# Patient Record
Sex: Male | Born: 1935 | Race: Asian | Hispanic: No | Marital: Married | Smoking: Never smoker
Health system: Southern US, Community
[De-identification: ages and names within clinical notes are randomized; demographics above are authoritative.]

## PROBLEM LIST (undated history)

## (undated) DIAGNOSIS — N400 Enlarged prostate without lower urinary tract symptoms: Secondary | ICD-10-CM

## (undated) DIAGNOSIS — A159 Respiratory tuberculosis unspecified: Secondary | ICD-10-CM

## (undated) DIAGNOSIS — I10 Essential (primary) hypertension: Secondary | ICD-10-CM

## (undated) DIAGNOSIS — I639 Cerebral infarction, unspecified: Secondary | ICD-10-CM

## (undated) DIAGNOSIS — K4090 Unilateral inguinal hernia, without obstruction or gangrene, not specified as recurrent: Secondary | ICD-10-CM

## (undated) HISTORY — PX: FRACTURE SURGERY: SHX138

---

## 1998-11-10 HISTORY — PX: BRAIN SURGERY: SHX531

## 1999-06-04 ENCOUNTER — Encounter: Payer: Self-pay | Admitting: Internal Medicine

## 1999-06-04 ENCOUNTER — Inpatient Hospital Stay (HOSPITAL_COMMUNITY): Admission: EM | Admit: 1999-06-04 | Discharge: 1999-06-11 | Payer: Self-pay | Admitting: Emergency Medicine

## 1999-07-10 ENCOUNTER — Ambulatory Visit (HOSPITAL_COMMUNITY): Admission: RE | Admit: 1999-07-10 | Discharge: 1999-07-10 | Payer: Self-pay | Admitting: Gastroenterology

## 2002-11-10 DIAGNOSIS — I639 Cerebral infarction, unspecified: Secondary | ICD-10-CM

## 2002-11-10 HISTORY — DX: Cerebral infarction, unspecified: I63.9

## 2003-05-23 ENCOUNTER — Encounter: Payer: Self-pay | Admitting: Emergency Medicine

## 2003-05-23 ENCOUNTER — Inpatient Hospital Stay (HOSPITAL_COMMUNITY): Admission: EM | Admit: 2003-05-23 | Discharge: 2003-05-30 | Payer: Self-pay | Admitting: Emergency Medicine

## 2003-05-23 ENCOUNTER — Encounter: Payer: Self-pay | Admitting: Neurology

## 2003-05-24 ENCOUNTER — Encounter: Payer: Self-pay | Admitting: Neurology

## 2003-05-26 ENCOUNTER — Encounter: Payer: Self-pay | Admitting: Neurology

## 2003-05-30 ENCOUNTER — Inpatient Hospital Stay (HOSPITAL_COMMUNITY)
Admission: RE | Admit: 2003-05-30 | Discharge: 2003-06-23 | Payer: Self-pay | Admitting: Physical Medicine & Rehabilitation

## 2003-05-31 ENCOUNTER — Encounter: Payer: Self-pay | Admitting: Neurology

## 2003-08-01 ENCOUNTER — Encounter
Admission: RE | Admit: 2003-08-01 | Discharge: 2003-10-30 | Payer: Self-pay | Admitting: Physical Medicine & Rehabilitation

## 2003-08-17 ENCOUNTER — Encounter
Admission: RE | Admit: 2003-08-17 | Discharge: 2003-11-15 | Payer: Self-pay | Admitting: Physical Medicine & Rehabilitation

## 2004-09-24 ENCOUNTER — Emergency Department (HOSPITAL_COMMUNITY): Admission: EM | Admit: 2004-09-24 | Discharge: 2004-09-24 | Payer: Self-pay | Admitting: Family Medicine

## 2008-12-30 ENCOUNTER — Observation Stay (HOSPITAL_COMMUNITY): Admission: EM | Admit: 2008-12-30 | Discharge: 2008-12-31 | Payer: Self-pay | Admitting: Emergency Medicine

## 2008-12-30 ENCOUNTER — Ambulatory Visit: Payer: Self-pay | Admitting: Internal Medicine

## 2009-03-22 ENCOUNTER — Inpatient Hospital Stay (HOSPITAL_COMMUNITY): Admission: EM | Admit: 2009-03-22 | Discharge: 2009-03-30 | Payer: Self-pay | Admitting: Emergency Medicine

## 2009-03-22 ENCOUNTER — Ambulatory Visit: Payer: Self-pay | Admitting: Internal Medicine

## 2009-03-23 ENCOUNTER — Encounter (INDEPENDENT_AMBULATORY_CARE_PROVIDER_SITE_OTHER): Payer: Self-pay | Admitting: Internal Medicine

## 2009-03-24 ENCOUNTER — Ambulatory Visit: Payer: Self-pay | Admitting: Infectious Diseases

## 2009-03-24 ENCOUNTER — Ambulatory Visit: Payer: Self-pay | Admitting: Vascular Surgery

## 2009-03-24 ENCOUNTER — Encounter (INDEPENDENT_AMBULATORY_CARE_PROVIDER_SITE_OTHER): Payer: Self-pay | Admitting: Internal Medicine

## 2009-03-27 ENCOUNTER — Encounter: Payer: Self-pay | Admitting: Internal Medicine

## 2009-03-28 ENCOUNTER — Ambulatory Visit: Payer: Self-pay | Admitting: Oncology

## 2009-03-28 ENCOUNTER — Encounter: Payer: Self-pay | Admitting: Internal Medicine

## 2009-03-30 ENCOUNTER — Ambulatory Visit: Payer: Self-pay | Admitting: Physical Medicine & Rehabilitation

## 2009-03-30 ENCOUNTER — Inpatient Hospital Stay (HOSPITAL_COMMUNITY)
Admission: RE | Admit: 2009-03-30 | Discharge: 2009-04-06 | Payer: Self-pay | Admitting: Physical Medicine & Rehabilitation

## 2009-04-17 ENCOUNTER — Encounter: Payer: Self-pay | Admitting: Infectious Diseases

## 2009-04-26 ENCOUNTER — Ambulatory Visit: Payer: Self-pay | Admitting: Infectious Diseases

## 2009-04-26 DIAGNOSIS — G8111 Spastic hemiplegia affecting right dominant side: Secondary | ICD-10-CM

## 2009-04-26 DIAGNOSIS — R630 Anorexia: Secondary | ICD-10-CM | POA: Insufficient documentation

## 2009-04-26 DIAGNOSIS — I10 Essential (primary) hypertension: Secondary | ICD-10-CM

## 2009-04-26 DIAGNOSIS — N318 Other neuromuscular dysfunction of bladder: Secondary | ICD-10-CM

## 2009-04-26 DIAGNOSIS — A150 Tuberculosis of lung: Secondary | ICD-10-CM

## 2009-04-26 LAB — CONVERTED CEMR LAB
ALT: 14 units/L (ref 0–53)
AST: 20 units/L (ref 0–37)
Albumin: 3.2 g/dL — ABNORMAL LOW (ref 3.5–5.2)
Alkaline Phosphatase: 49 units/L (ref 39–117)
Basophils Absolute: 0 10*3/uL (ref 0.0–0.1)
Eosinophils Absolute: 0.2 10*3/uL (ref 0.0–0.7)
Eosinophils Relative: 2 % (ref 0–5)
HCT: 30.8 % — ABNORMAL LOW (ref 39.0–52.0)
Hemoglobin: 9.8 g/dL — ABNORMAL LOW (ref 13.0–17.0)
MCV: 73.6 fL — ABNORMAL LOW (ref 78.0–100.0)
Monocytes Absolute: 1.2 10*3/uL — ABNORMAL HIGH (ref 0.1–1.0)
Platelets: 191 10*3/uL (ref 150–400)
Potassium: 4.4 meq/L (ref 3.5–5.3)
RDW: 16.9 % — ABNORMAL HIGH (ref 11.5–15.5)
Sodium: 131 meq/L — ABNORMAL LOW (ref 135–145)
Total Bilirubin: 0.6 mg/dL (ref 0.3–1.2)
Total Protein: 7.6 g/dL (ref 6.0–8.3)

## 2009-04-27 ENCOUNTER — Encounter: Payer: Self-pay | Admitting: Infectious Diseases

## 2009-05-29 ENCOUNTER — Encounter: Admission: RE | Admit: 2009-05-29 | Discharge: 2009-05-29 | Payer: Self-pay | Admitting: Pulmonary Disease

## 2009-12-12 ENCOUNTER — Encounter: Admission: RE | Admit: 2009-12-12 | Discharge: 2009-12-12 | Payer: Self-pay | Admitting: Infectious Diseases

## 2010-12-19 ENCOUNTER — Encounter: Admission: RE | Admit: 2010-12-19 | Payer: Self-pay | Source: Home / Self Care | Admitting: Family Medicine

## 2010-12-19 ENCOUNTER — Ambulatory Visit: Payer: Medicare Other | Attending: Family Medicine | Admitting: Physical Therapy

## 2010-12-19 DIAGNOSIS — IMO0001 Reserved for inherently not codable concepts without codable children: Secondary | ICD-10-CM | POA: Insufficient documentation

## 2010-12-19 DIAGNOSIS — R269 Unspecified abnormalities of gait and mobility: Secondary | ICD-10-CM | POA: Insufficient documentation

## 2010-12-19 DIAGNOSIS — I69998 Other sequelae following unspecified cerebrovascular disease: Secondary | ICD-10-CM | POA: Insufficient documentation

## 2010-12-19 DIAGNOSIS — R262 Difficulty in walking, not elsewhere classified: Secondary | ICD-10-CM | POA: Insufficient documentation

## 2011-02-18 LAB — IRON AND TIBC
Iron: 15 ug/dL — ABNORMAL LOW (ref 42–135)
Iron: 26 ug/dL — ABNORMAL LOW (ref 42–135)
Saturation Ratios: 11 % — ABNORMAL LOW (ref 20–55)
TIBC: 141 ug/dL — ABNORMAL LOW (ref 215–435)
TIBC: 155 ug/dL — ABNORMAL LOW (ref 215–435)
UIBC: 129 ug/dL

## 2011-02-18 LAB — DIFFERENTIAL
Band Neutrophils: 0 % (ref 0–10)
Band Neutrophils: 0 % (ref 0–10)
Band Neutrophils: 0 % (ref 0–10)
Basophils Absolute: 0 10*3/uL (ref 0.0–0.1)
Basophils Absolute: 0 10*3/uL (ref 0.0–0.1)
Basophils Absolute: 0.1 10*3/uL (ref 0.0–0.1)
Basophils Absolute: 0.1 10*3/uL (ref 0.0–0.1)
Basophils Absolute: 0 10*3/uL (ref 0.0–0.1)
Basophils Absolute: 0 10*3/uL (ref 0.0–0.1)
Basophils Absolute: 0 10*3/uL (ref 0.0–0.1)
Basophils Relative: 0 % (ref 0–1)
Basophils Relative: 0 % (ref 0–1)
Basophils Relative: 1 % (ref 0–1)
Basophils Relative: 1 % (ref 0–1)
Basophils Relative: 2 % — ABNORMAL HIGH (ref 0–1)
Basophils Relative: 2 % — ABNORMAL HIGH (ref 0–1)
Basophils Relative: 2 % — ABNORMAL HIGH (ref 0–1)
Basophils Relative: 3 % — ABNORMAL HIGH (ref 0–1)
Basophils Relative: 0 % (ref 0–1)
Basophils Relative: 0 % (ref 0–1)
Blasts: 0 %
Blasts: 0 %
Blasts: 0 %
Eosinophils Absolute: 0.3 10*3/uL (ref 0.0–0.7)
Eosinophils Absolute: 0.4 10*3/uL (ref 0.0–0.7)
Eosinophils Absolute: 0.5 10*3/uL (ref 0.0–0.7)
Eosinophils Absolute: 0.1 10*3/uL (ref 0.0–0.7)
Eosinophils Relative: 11 % — ABNORMAL HIGH (ref 0–5)
Eosinophils Relative: 11 % — ABNORMAL HIGH (ref 0–5)
Eosinophils Relative: 14 % — ABNORMAL HIGH (ref 0–5)
Eosinophils Relative: 4 % (ref 0–5)
Lymphocytes Relative: 31 % (ref 12–46)
Lymphocytes Relative: 34 % (ref 12–46)
Lymphocytes Relative: 5 % — ABNORMAL LOW (ref 12–46)
Lymphocytes Relative: 6 % — ABNORMAL LOW (ref 12–46)
Lymphocytes Relative: 8 % — ABNORMAL LOW (ref 12–46)
Lymphs Abs: 0.5 10*3/uL — ABNORMAL LOW (ref 0.7–4.0)
Lymphs Abs: 0.6 10*3/uL — ABNORMAL LOW (ref 0.7–4.0)
Lymphs Abs: 1.3 10*3/uL (ref 0.7–4.0)
Lymphs Abs: 0.7 10*3/uL (ref 0.7–4.0)
Lymphs Abs: 1 10*3/uL (ref 0.7–4.0)
Lymphs Abs: 1.2 10*3/uL (ref 0.7–4.0)
Metamyelocytes Relative: 0 %
Metamyelocytes Relative: 0 %
Metamyelocytes Relative: 0 %
Metamyelocytes Relative: 0 %
Monocytes Absolute: 0.5 10*3/uL (ref 0.1–1.0)
Monocytes Absolute: 0.9 10*3/uL (ref 0.1–1.0)
Monocytes Absolute: 0.9 10*3/uL (ref 0.1–1.0)
Monocytes Absolute: 1 10*3/uL (ref 0.1–1.0)
Monocytes Absolute: 1 10*3/uL (ref 0.1–1.0)
Monocytes Absolute: 1.2 10*3/uL — ABNORMAL HIGH (ref 0.1–1.0)
Monocytes Absolute: 1.6 10*3/uL — ABNORMAL HIGH (ref 0.1–1.0)
Monocytes Relative: 25 % — ABNORMAL HIGH (ref 3–12)
Monocytes Relative: 31 % — ABNORMAL HIGH (ref 3–12)
Monocytes Relative: 33 % — ABNORMAL HIGH (ref 3–12)
Monocytes Relative: 36 % — ABNORMAL HIGH (ref 3–12)
Monocytes Relative: 37 % — ABNORMAL HIGH (ref 3–12)
Myelocytes: 0 %
Myelocytes: 0 %
Neutro Abs: 0.4 10*3/uL — ABNORMAL LOW (ref 1.7–7.7)
Neutro Abs: 0.6 10*3/uL — ABNORMAL LOW (ref 1.7–7.7)
Neutro Abs: 1 10*3/uL — ABNORMAL LOW (ref 1.7–7.7)
Neutro Abs: 11.4 10*3/uL — ABNORMAL HIGH (ref 1.7–7.7)
Neutro Abs: 11.6 10*3/uL — ABNORMAL HIGH (ref 1.7–7.7)
Neutro Abs: 12.3 10*3/uL — ABNORMAL HIGH (ref 1.7–7.7)
Neutrophils Relative %: 16 % — ABNORMAL LOW (ref 43–77)
Neutrophils Relative %: 23 % — ABNORMAL LOW (ref 43–77)
Neutrophils Relative %: 26 % — ABNORMAL LOW (ref 43–77)
Neutrophils Relative %: 29 % — ABNORMAL LOW (ref 43–77)
Neutrophils Relative %: 79 % — ABNORMAL HIGH (ref 43–77)
Neutrophils Relative %: 81 % — ABNORMAL HIGH (ref 43–77)
Neutrophils Relative %: 82 % — ABNORMAL HIGH (ref 43–77)
Promyelocytes Absolute: 0 %
Smear Review: ADEQUATE
Smear Review: ADEQUATE
Smear Review: ADEQUATE
nRBC: 0 /100 WBC

## 2011-02-18 LAB — CBC
HCT: 25.8 % — ABNORMAL LOW (ref 39.0–52.0)
HCT: 26.3 % — ABNORMAL LOW (ref 39.0–52.0)
HCT: 27.8 % — ABNORMAL LOW (ref 39.0–52.0)
HCT: 28.9 % — ABNORMAL LOW (ref 39.0–52.0)
HCT: 29.1 % — ABNORMAL LOW (ref 39.0–52.0)
HCT: 30.1 % — ABNORMAL LOW (ref 39.0–52.0)
HCT: 30.2 % — ABNORMAL LOW (ref 39.0–52.0)
HCT: 28.8 % — ABNORMAL LOW (ref 39.0–52.0)
HCT: 31.3 % — ABNORMAL LOW (ref 39.0–52.0)
Hemoglobin: 10.4 g/dL — ABNORMAL LOW (ref 13.0–17.0)
Hemoglobin: 8.5 g/dL — ABNORMAL LOW (ref 13.0–17.0)
Hemoglobin: 8.8 g/dL — ABNORMAL LOW (ref 13.0–17.0)
Hemoglobin: 9.3 g/dL — ABNORMAL LOW (ref 13.0–17.0)
Hemoglobin: 9.5 g/dL — ABNORMAL LOW (ref 13.0–17.0)
Hemoglobin: 9.5 g/dL — ABNORMAL LOW (ref 13.0–17.0)
Hemoglobin: 10 g/dL — ABNORMAL LOW (ref 13.0–17.0)
Hemoglobin: 9.5 g/dL — ABNORMAL LOW (ref 13.0–17.0)
MCHC: 32.4 g/dL (ref 30.0–36.0)
MCHC: 32.4 g/dL (ref 30.0–36.0)
MCHC: 32.6 g/dL (ref 30.0–36.0)
MCHC: 33.4 g/dL (ref 30.0–36.0)
MCHC: 32.9 g/dL (ref 30.0–36.0)
MCV: 71.1 fL — ABNORMAL LOW (ref 78.0–100.0)
MCV: 71.4 fL — ABNORMAL LOW (ref 78.0–100.0)
MCV: 71.5 fL — ABNORMAL LOW (ref 78.0–100.0)
MCV: 71.7 fL — ABNORMAL LOW (ref 78.0–100.0)
MCV: 71.9 fL — ABNORMAL LOW (ref 78.0–100.0)
MCV: 72.5 fL — ABNORMAL LOW (ref 78.0–100.0)
MCV: 72.6 fL — ABNORMAL LOW (ref 78.0–100.0)
Platelets: 267 10*3/uL (ref 150–400)
Platelets: 296 10*3/uL (ref 150–400)
Platelets: ADEQUATE 10*3/uL (ref 150–400)
Platelets: ADEQUATE 10*3/uL (ref 150–400)
Platelets: UNDETERMINED 10*3/uL (ref 150–400)
Platelets: 160 10*3/uL (ref 150–400)
Platelets: 237 10*3/uL (ref 150–400)
RBC: 3.63 MIL/uL — ABNORMAL LOW (ref 4.22–5.81)
RBC: 3.69 MIL/uL — ABNORMAL LOW (ref 4.22–5.81)
RBC: 3.86 MIL/uL — ABNORMAL LOW (ref 4.22–5.81)
RBC: 3.97 MIL/uL — ABNORMAL LOW (ref 4.22–5.81)
RBC: 3.98 MIL/uL — ABNORMAL LOW (ref 4.22–5.81)
RBC: 4.21 MIL/uL — ABNORMAL LOW (ref 4.22–5.81)
RBC: 4.43 MIL/uL (ref 4.22–5.81)
RBC: 3.98 MIL/uL — ABNORMAL LOW (ref 4.22–5.81)
RBC: 4.32 MIL/uL (ref 4.22–5.81)
RDW: 13.7 % (ref 11.5–15.5)
RDW: 13.8 % (ref 11.5–15.5)
RDW: 13.9 % (ref 11.5–15.5)
RDW: 14.6 % (ref 11.5–15.5)
RDW: 13.4 % (ref 11.5–15.5)
RDW: 13.6 % (ref 11.5–15.5)
RDW: 13.7 % (ref 11.5–15.5)
WBC: 12 10*3/uL — ABNORMAL HIGH (ref 4.0–10.5)
WBC: 2.6 10*3/uL — ABNORMAL LOW (ref 4.0–10.5)
WBC: 2.9 10*3/uL — ABNORMAL LOW (ref 4.0–10.5)
WBC: 3.1 10*3/uL — ABNORMAL LOW (ref 4.0–10.5)
WBC: 3.2 10*3/uL — ABNORMAL LOW (ref 4.0–10.5)
WBC: 3.2 10*3/uL — ABNORMAL LOW (ref 4.0–10.5)
WBC: 3.3 10*3/uL — ABNORMAL LOW (ref 4.0–10.5)
WBC: 14.3 10*3/uL — ABNORMAL HIGH (ref 4.0–10.5)
WBC: 14.5 10*3/uL — ABNORMAL HIGH (ref 4.0–10.5)
WBC: 15 10*3/uL — ABNORMAL HIGH (ref 4.0–10.5)

## 2011-02-18 LAB — BODY FLUID CELL COUNT WITH DIFFERENTIAL
Eos, Fluid: 2 %
Monocyte-Macrophage-Serous Fluid: 15 % — ABNORMAL LOW (ref 50–90)
Neutrophil Count, Fluid: 19 % (ref 0–25)
Total Nucleated Cell Count, Fluid: 505 cu mm (ref 0–1000)

## 2011-02-18 LAB — APTT
aPTT: 38 seconds — ABNORMAL HIGH (ref 24–37)
aPTT: 43 seconds — ABNORMAL HIGH (ref 24–37)

## 2011-02-18 LAB — BASIC METABOLIC PANEL
BUN: 11 mg/dL (ref 6–23)
BUN: 12 mg/dL (ref 6–23)
BUN: 18 mg/dL (ref 6–23)
BUN: 9 mg/dL (ref 6–23)
Calcium: 8.9 mg/dL (ref 8.4–10.5)
Chloride: 100 mEq/L (ref 96–112)
Chloride: 105 mEq/L (ref 96–112)
Creatinine, Ser: 0.81 mg/dL (ref 0.4–1.5)
Creatinine, Ser: 0.93 mg/dL (ref 0.4–1.5)
GFR calc Af Amer: >60 mL/min (ref >60)
GFR calc non Af Amer: >60 mL/min (ref >60)
GFR calc non Af Amer: >60 mL/min (ref >60)
GFR calc non Af Amer: >60 mL/min (ref >60)
Glucose, Bld: 92 mg/dL (ref 70–99)
Glucose, Bld: 100 mg/dL — ABNORMAL HIGH (ref 70–99)
Potassium: 3.6 mEq/L (ref 3.5–5.1)
Potassium: 3.6 mEq/L (ref 3.5–5.1)
Potassium: 3.8 mEq/L (ref 3.5–5.1)
Sodium: 134 mEq/L — ABNORMAL LOW (ref 135–145)

## 2011-02-18 LAB — RETICULOCYTES
RBC.: 3.8 MIL/uL — ABNORMAL LOW (ref 4.22–5.81)
RBC.: 4.29 MIL/uL (ref 4.22–5.81)
RBC.: 4.26 MIL/uL (ref 4.22–5.81)
Retic Count, Absolute: 42.9 10*3/uL (ref 19.0–186.0)
Retic Ct Pct: 1.3 % (ref 0.4–3.1)
Retic Ct Pct: 0.7 % (ref 0.4–3.1)

## 2011-02-18 LAB — COMPREHENSIVE METABOLIC PANEL
ALT: 30 U/L (ref 0–53)
ALT: 60 U/L — ABNORMAL HIGH (ref 0–53)
ALT: 33 U/L (ref 0–53)
Albumin: 2.2 g/dL — ABNORMAL LOW (ref 3.5–5.2)
Albumin: 2.5 g/dL — ABNORMAL LOW (ref 3.5–5.2)
Alkaline Phosphatase: 53 U/L (ref 39–117)
Alkaline Phosphatase: 65 U/L (ref 39–117)
BUN: 10 mg/dL (ref 6–23)
BUN: 9 mg/dL (ref 6–23)
Calcium: 8.3 mg/dL — ABNORMAL LOW (ref 8.4–10.5)
Chloride: 103 mEq/L (ref 96–112)
Chloride: 94 mEq/L — ABNORMAL LOW (ref 96–112)
Glucose, Bld: 101 mg/dL — ABNORMAL HIGH (ref 70–99)
Glucose, Bld: 95 mg/dL (ref 70–99)
Glucose, Bld: 117 mg/dL — ABNORMAL HIGH (ref 70–99)
Potassium: 4 mEq/L (ref 3.5–5.1)
Potassium: 4.1 mEq/L (ref 3.5–5.1)
Sodium: 133 mEq/L — ABNORMAL LOW (ref 135–145)
Sodium: 129 mEq/L — ABNORMAL LOW (ref 135–145)
Total Bilirubin: 1.2 mg/dL (ref 0.3–1.2)
Total Bilirubin: 0.6 mg/dL (ref 0.3–1.2)
Total Protein: 5.9 g/dL — ABNORMAL LOW (ref 6.0–8.3)
Total Protein: 7.4 g/dL (ref 6.0–8.3)

## 2011-02-18 LAB — GLUCOSE, CAPILLARY
Glucose-Capillary: 116 mg/dL — ABNORMAL HIGH (ref 70–99)
Glucose-Capillary: 94 mg/dL (ref 70–99)
Glucose-Capillary: 126 mg/dL — ABNORMAL HIGH (ref 70–99)

## 2011-02-18 LAB — LEGIONELLA PROFILE(CULTURE+DFA/SMEAR)

## 2011-02-18 LAB — CULTURE, RESPIRATORY W GRAM STAIN

## 2011-02-18 LAB — M. TUBERCULOSIS COMPLEX BY PCR: M. tuberculosis, Direct: NOT DETECTED

## 2011-02-18 LAB — URINALYSIS, ROUTINE W REFLEX MICROSCOPIC
Glucose, UA: NEGATIVE mg/dL
Nitrite: NEGATIVE
pH: 7.5 (ref 5.0–8.0)

## 2011-02-18 LAB — AFB CULTURE WITH SMEAR (NOT AT ARMC)
Acid Fast Smear: NONE SEEN
Acid Fast Smear: NONE SEEN

## 2011-02-18 LAB — FERRITIN: Ferritin: 1296 ng/mL — ABNORMAL HIGH (ref 22–322)

## 2011-02-18 LAB — URINE CULTURE
Colony Count: NO GROWTH
Colony Count: NO GROWTH
Culture: NO GROWTH
Culture: NO GROWTH
Special Requests: NEGATIVE

## 2011-02-18 LAB — PROTIME-INR
INR: 1.2 (ref 0.00–1.49)
INR: 1.3 (ref 0.00–1.49)
Prothrombin Time: 15.9 seconds — ABNORMAL HIGH (ref 11.6–15.2)
Prothrombin Time: 16 seconds — ABNORMAL HIGH (ref 11.6–15.2)
Prothrombin Time: 16.1 seconds — ABNORMAL HIGH (ref 11.6–15.2)
Prothrombin Time: 15.3 seconds — ABNORMAL HIGH (ref 11.6–15.2)

## 2011-02-18 LAB — CULTURE, BLOOD (ROUTINE X 2): Culture: NO GROWTH

## 2011-02-18 LAB — C-REACTIVE PROTEIN: CRP: 18.6 mg/dL — ABNORMAL HIGH (ref <0.6)

## 2011-02-18 LAB — FUNGUS CULTURE W SMEAR: Fungal Smear: NONE SEEN

## 2011-02-18 LAB — VON WILLEBRAND PANEL
Factor-VIII Activity: 95 % (ref 50–150)
Ristocetin-Cofactor: 137 % (ref 50–150)
Von Willebrand Ag: 324 % normal — ABNORMAL HIGH (ref 61–164)

## 2011-02-18 LAB — SODIUM, URINE, RANDOM: Sodium, Ur: 53 mEq/L

## 2011-02-18 LAB — TSH: TSH: 0.966 u[IU]/mL (ref 0.350–4.500)

## 2011-02-18 LAB — HEMOGLOBINOPATHY EVALUATION
Hemoglobin Other: 0 % (ref 0.0–0.0)
Hgb A: 97.1 % (ref 96.8–97.8)

## 2011-02-18 LAB — LUPUS ANTICOAGULANT PANEL
DRVVT: 48.6 secs — ABNORMAL HIGH (ref 36.1–47.0)
Lupus Anticoagulant: NOT DETECTED
PTT Lupus Anticoagulant: 53.8 secs — ABNORMAL HIGH (ref 36.3–48.8)

## 2011-02-18 LAB — PATHOLOGIST SMEAR REVIEW

## 2011-02-18 LAB — AFB CULTURE, BLOOD

## 2011-02-18 LAB — FUNGAL STAIN: Special Requests: 1

## 2011-02-18 LAB — POCT I-STAT, CHEM 8
BUN: 10 mg/dL (ref 6–23)
Calcium, Ion: 1.15 mmol/L (ref 1.12–1.32)
Creatinine, Ser: 0.9 mg/dL (ref 0.4–1.5)
TCO2: 26 mmol/L (ref 0–100)

## 2011-02-18 LAB — FREE PSA: PSA, Free Pct: 14 % — ABNORMAL LOW (ref >25)

## 2011-02-18 LAB — URINALYSIS, MICROSCOPIC ONLY
Leukocytes, UA: NEGATIVE
Nitrite: NEGATIVE
Specific Gravity, Urine: 1.013 (ref 1.005–1.030)
pH: 6 (ref 5.0–8.0)

## 2011-02-18 LAB — STREP PNEUMONIAE URINARY ANTIGEN: Strep Pneumo Urinary Antigen: NEGATIVE

## 2011-02-18 LAB — CREATININE, URINE, RANDOM: Creatinine, Urine: 31.9 mg/dL

## 2011-02-18 LAB — BETA-2-GLYCOPROTEIN I ABS, IGG/M/A: Beta-2-Glycoprotein I IgA: <4 U/mL (ref <10)

## 2011-02-18 LAB — HEPATITIS B SURFACE ANTIBODY,QUALITATIVE
Hep B S Ab: NEGATIVE
Hep B S Ab: NEGATIVE

## 2011-02-18 LAB — CARDIOLIPIN ANTIBODIES, IGG, IGM, IGA
Anticardiolipin IgG: <7 [GPL'U] — ABNORMAL LOW (ref <11)
Anticardiolipin IgM: <7 [MPL'U] — ABNORMAL LOW (ref <10)

## 2011-02-18 LAB — FACTOR 10 ASSAY: Factor X Activity: 71 % — ABNORMAL LOW (ref 72–134)

## 2011-02-18 LAB — GC/CHLAMYDIA PROBE AMP, URINE: GC Probe Amp, Urine: NEGATIVE

## 2011-02-18 LAB — PTT FACTOR INHIBITOR (MIXING STUDY): 1 Hr Incub PT 1:1NP: 40 seconds

## 2011-02-18 LAB — HISTOPLASMA ANTIGEN, URINE
Histoplasma Antigen Urine: NEGATIVE
Histoplasma Antigen, urine: <2 U/mL

## 2011-02-18 LAB — SEDIMENTATION RATE: Sed Rate: 66 mm/hr — ABNORMAL HIGH (ref 0–16)

## 2011-02-18 LAB — FOLATE: Folate: 13.5 ng/mL

## 2011-02-18 LAB — HIV 1/2 CONFIRMATION
HIV-1 antibody: UNDETERMINED
HIV-2 Ab: NEGATIVE

## 2011-02-18 LAB — HEPATITIS B SURFACE ANTIGEN: Hepatitis B Surface Ag: POSITIVE — AB

## 2011-02-18 LAB — HEPATITIS B DNA, ULTRAQUANTITATIVE, PCR: Hepatitis B DNA (Calc): 1129 copies/mL — ABNORMAL HIGH (ref <169)

## 2011-02-18 LAB — AFB STAIN: Special Requests: 1

## 2011-02-18 LAB — FACTOR 7 ASSAY: Factor VII Activity: 46 % — ABNORMAL LOW (ref 80–181)

## 2011-02-18 LAB — CRYPTOCOCCAL ANTIGEN: Crypto Ag: NEGATIVE

## 2011-02-25 LAB — CBC
HCT: 35.4 % — ABNORMAL LOW (ref 39.0–52.0)
Hemoglobin: 11.4 g/dL — ABNORMAL LOW (ref 13.0–17.0)
Hemoglobin: 11.4 g/dL — ABNORMAL LOW (ref 13.0–17.0)
MCHC: 32.2 g/dL (ref 30.0–36.0)
Platelets: UNDETERMINED 10*3/uL (ref 150–400)
Platelets: UNDETERMINED 10*3/uL (ref 150–400)
RDW: 13.3 % (ref 11.5–15.5)
RDW: 13.5 % (ref 11.5–15.5)

## 2011-02-25 LAB — DIFFERENTIAL
Basophils Relative: 1 % (ref 0–1)
Eosinophils Relative: 7 % — ABNORMAL HIGH (ref 0–5)
Lymphocytes Relative: 18 % (ref 12–46)
Monocytes Relative: 9 % (ref 3–12)
Neutrophils Relative %: 65 % (ref 43–77)

## 2011-02-25 LAB — URINALYSIS, ROUTINE W REFLEX MICROSCOPIC
Glucose, UA: NEGATIVE mg/dL
Ketones, ur: NEGATIVE mg/dL
Nitrite: NEGATIVE
Protein, ur: NEGATIVE mg/dL

## 2011-02-25 LAB — COMPREHENSIVE METABOLIC PANEL
ALT: 23 U/L (ref 0–53)
AST: 26 U/L (ref 0–37)
Albumin: 3.5 g/dL (ref 3.5–5.2)
Alkaline Phosphatase: 52 U/L (ref 39–117)
BUN: 10 mg/dL (ref 6–23)
Calcium: 8.5 mg/dL (ref 8.4–10.5)
GFR calc Af Amer: >60 mL/min (ref >60)
Glucose, Bld: 87 mg/dL (ref 70–99)
Potassium: 4.1 mEq/L (ref 3.5–5.1)
Sodium: 134 mEq/L — ABNORMAL LOW (ref 135–145)
Total Protein: 7.1 g/dL (ref 6.0–8.3)
Total Protein: 7.2 g/dL (ref 6.0–8.3)

## 2011-02-25 LAB — AMYLASE: Amylase: 154 U/L — ABNORMAL HIGH (ref 27–131)

## 2011-02-25 LAB — LIPASE, BLOOD: Lipase: 156 U/L — ABNORMAL HIGH (ref 11–59)

## 2011-02-25 LAB — POCT I-STAT, CHEM 8
BUN: 11 mg/dL (ref 6–23)
Hemoglobin: 13.6 g/dL (ref 13.0–17.0)
Sodium: 134 mEq/L — ABNORMAL LOW (ref 135–145)
TCO2: 28 mmol/L (ref 0–100)

## 2011-02-25 LAB — PROTIME-INR
INR: 1 (ref 0.00–1.49)
Prothrombin Time: 13.7 seconds (ref 11.6–15.2)

## 2011-02-25 LAB — URINE CULTURE

## 2011-02-25 LAB — GLUCOSE, CAPILLARY
Glucose-Capillary: 114 mg/dL — ABNORMAL HIGH (ref 70–99)
Glucose-Capillary: 91 mg/dL (ref 70–99)

## 2011-02-25 LAB — APTT: aPTT: 39 seconds — ABNORMAL HIGH (ref 24–37)

## 2011-03-25 NOTE — H&P (Signed)
[name], [name]              ACCOUNT NO.:  [id number]   MEDICAL RECORD NO.:  [id number]          PATIENT TYPE:  INP   LOCATION:  6712                         FACILITY:  [facility]   PHYSICIAN:  Gordy Savers, MDDATE OF BIRTH:  1934/09/16   DATE OF ADMISSION:  12/29/2008  DATE OF DISCHARGE:                              HISTORY & PHYSICAL   CHIEF COMPLAINT:  Abdominal pain.   HISTORY OF PRESENT ILLNESS:  The patient is a 77 year old [place]  male who has a history of cerebrovascular disease and chronic dense  right hemiparesis.  He normally is semi-ambulatory and able to transfer  and uses a powered wheelchair.  His wife states that 2 days ago he fell  to his right side sustaining some trauma to the right chest wall.  She  states that he slid more to the floor against a table and was not a  severe fall.  There was no apparent major trauma at that time, and the  patient complained of no pain.  Yesterday morning, the patient began  having of right-sided abdominal pain.  This has been persisted and  progressed throughout the day and the patient eventually was evaluated  in the emergency room.  There is no associated nausea or vomiting and  the patient's bowel habits have been normal.  ED evaluation included a  serum lipase that was elevated at 156 and I was asked to admit the  patient for suspected pancreatitis.  His white count was 8.4.  Transaminases were normal.  Acute abdominal series suggested a patchy  infiltrate at the left base, worrisome for early pneumonia.  An  abdominal ultrasound was obtained that revealed no gallstones or sludge.  There is no dilatation of the common bile duct.  The pancreas was not  visualized due to overlying bowel gas.  The abdominal ultrasound was  negative.  In the emergency room, the patient was treated with Dilaudid  and he now states that he is quite comfortable unless he moves.  He  states that it is unable to move his right leg well due  to pain and also  has pain with movement involving his right abdominal region.  A CT  abdominal scan has been ordered from the ED and is pending.  The patient  is now admitted for further evaluation and treatment of his abdominal  and right leg pain.   PAST MEDICAL HISTORY:  The patient has a history of cerebrovascular  disease and was admitted in July 2000 for an acute stroke syndrome.  At  that time, he underwent a left suboccipital craniectomy for evacuation  of a right cerebellar hematoma.  In July 2004, he was admitted with a  left thalamic and parietal hemorrhagic stroke.  He has hypertension and  BPH.   SOCIAL HISTORY:  He lives with his wife, nondrinker, nonsmoker.   FAMILY HISTORY:  Noncontributory.   ALLERGIES:  PENICILLIN.   PHYSICAL EXAMINATION:  VITAL SIGNS:  Blood pressure was 135/76, pulse  rate and respiratory rate normal, and O2 saturation 96-100%.  GENERAL:  A well-developed Asian male in no acute distress at  rest.  SKIN:  Unremarkable.  HEENT:  No signs of trauma.  Conjunctiva clear.  Mild arcus senilis  noted.  No scleral icterus appreciated.  ENT normal.  NECK:  Prominent jugular venous pressure.  No bruits.  No adenopathy.  CHEST:  Clear.  CARDIOVASCULAR:  Normal S1 and S2.  There is no tachycardia.  ABDOMEN:  Somewhat firm but nondistended.  There is no real localized  pain.  There is no tenderness over the right lower chest wall area.  Bowel sounds were active.  EXTREMITIES:  No edema.  Peripheral pulses were intact.  The patient did  have a dense right hemiparesis and flexion of the right hip did tend to  cause some minor discomfort.  NEURO:  The patient is alert and a spastic right hemiparesis.   IMPRESSION:  1. History of abdominal pain with elevated lipase, very little to      suggest acute pancreatitis on clinical grounds.  2. Right leg pain.  The patient has a history of a recent fall.  We      will check an x-ray to rule out a fracture.    ADDITIONAL DIAGNOSES:  Hypertension and cerebrovascular disease with  chronic spastic right hemiparesis.   DISPOSITION:  A CT abdominal scan has been ordered in the emergency  department, the results are pending.  We will also check an x-ray  involving the right hip.  We will admit for observation and pain  control.  We will continue on his antihypertensive regimen and treat  with Lovenox DVT prophylaxis.      Gordy Savers, MD  Electronically Signed     PFK/MEDQ  D:  12/30/2008  T:  12/30/2008  Job:  [id number]

## 2011-03-25 NOTE — Op Note (Signed)
NAMEELIOTT, AMPARAN              ACCOUNT NO.:  [id number]   MEDICAL RECORD NO.:  [id number]          PATIENT TYPE:  INP   LOCATION:  3702                         FACILITY:  [facility]   PHYSICIAN:  Kalman Shan, MD   DATE OF BIRTH:  June 22, 1934   DATE OF PROCEDURE:  03/27/2009  DATE OF DISCHARGE:                               OPERATIVE REPORT   TYPE OF PROCEDURE:  Bronchoscopy with lavage and biopsies.   INDICATIONS:  Pulmonary infiltrates, concern for miliary tuberculosis in  this [place] gentleman.   PREPROCEDURE ASSESSMENT:  This was done as less than 38 days old.  Preprocedure assessment today showed that his vital signs were stable  and he was fit for procedure.  Exam was at baseline, which is a frail  male with hemiparesis.  There were no other changes in the exam.  Of  note, his PT/PTT were prolonged, but mixing studies were done, and a PTT  was normal.  The INR was 1.2, therefore decided to do the procedure.   PREPROCEDURE VITAL SIGNS:  Height 65 inches, weight 130 pounds, blood  pressure 124/74, pulse of 88, and respiratory rate of 18.  ASA class II.  Airway assessment class II.  Physical exam was unchanged.   The patient could undergo procedure and IV sedation.   Risks of sedation complications, bleeding, and pneumothorax were  explained to the family, wife, and son.  They are extremely concerned  about these complications, but the indications outweighed the risks.  Infectious Disease, Dr. Sampson Goon and Dr. Maurice March spoke to the family and  expressed their desire to have bronchoscopy, after that family  consented.   PROCEDURE NOTE:  Large bronchoscope was introduced through the mouth  because the nares were tight at 11:40 a.m.  The epiglottis and the vocal  cords were visualized.  They were all normal.  The trachea was entered.  A detailed airway exam was performed.  The trachea, the right main  bronchus, the right upper lobe takeoff, left lower lobe takeoff, the  right middle lobe takeoff, and the subsequent subsegments were all  normal.  Scope was withdrawn.  The carina was examined.  This was sharp,  left-sided airway exam was done.  Left main bronchus, left bronchus  intermedius, and the respective lobar takeoffs, and subsegments were all  normal.   Subsequently, a 140 mL lavage of the right middle lobe medial segment  was performed with 70-80 mL of pinkish returns.  These returns were  considered abnormal.  They were not bloody in nature.  After this, the  right upper lobe biopsy was performed.  At first, the right upper lobe  anterior segment biopsies were taken.  Two of these were sent for  microbiology for AFB.  Right upper lobe, both anterior segment and  apical segment total of 4 biopsies were taken for pathology.  Following  this, right upper lobe anterior segment brush was done.  This is being  sent as a slide for microbiology AFB.   The scope was then withdrawn at 12:10 p.m.   POSTPROCEDURE COMMENTS:  Fentanyl given 50 mcg, Versed 2 mg,  and  lidocaine 70 mL.  The patient was comfortable throughout procedure.  His  RASS sedation score was -2 throughout procedure, had no cough, and  tolerated the procedure really well.   COMPLICATIONS:  None.   POSTPROCEDURE PLAN:  He will be sent to recovery in good condition and  after that he will go to the floor.   1. Send right middle lobe bronchoalveolar lavage for cell count      differential, microbiology, and cytology analysis.  2. Send right upper lobe anterior segment biopsies 2 pieces for      microbiology.  3. Send right upper lobe apical segment and anterior segment 4 pieces      for pathology.  4. Send right upper lobe anterior segment brush for microbiology      slide.   We will await the results.      Kalman Shan, MD  Electronically Signed     MR/MEDQ  D:  03/27/2009  T:  03/28/2009  Job:  [id number]   cc:   Mick Sell, MD  Fransisco Hertz, M.D.

## 2011-03-25 NOTE — Discharge Summary (Signed)
[name], [name]              ACCOUNT NO.:  [id number]   MEDICAL RECORD NO.:  [id number]          PATIENT TYPE:  INP   LOCATION:  3702                         FACILITY:  [facility]   PHYSICIAN:  Ramiro Harvest, MD    DATE OF BIRTH:  [birth year]   DATE OF ADMISSION:  03/22/2009  DATE OF DISCHARGE:  03/30/2009                               DISCHARGE SUMMARY   PRIMARY CARE PHYSICIAN:  Dr. Azucena Cecil of [facility].   DISCHARGE DIAGNOSES:  1. 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  2. Anemia of chronic disease.  3. Abnormal coagulation profile.  4. Dehydration.  5. Hyponatremia.  6. Orthostasis, resolved.  7. Hypertension, stable.  8. Benign prostatic hypertrophy.  9. Hypokalemia, resolved.  10.History of hypertension.  11.History of inguinal right hernia.  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  14.Hemorrhagic cerebrovascular accident in the left thalamic and      parietal region in July 2004.   DISCHARGE MEDICATIONS:  1. Isoniazid 300 mg p.o. daily  2. Ethambutol 1200 mg p.o. daily.  3. Pyrazinamide 1250 mg p.o. daily.  4. Rifampin 600 mg p.o. daily.  5. Vitamin B6 1 tablet p.o. daily.  6. Altace 10 mg p.o. daily.  7. Toprol XL 200 mg p.o. daily.  8. Flonase nasal spray 2 sprays to each nostril daily.  9. Tylenol p.r.n.   DISPOSITION AND FOLLOWUP:  The patient will be discharged to the  inpatient rehab unit.  The patient is to continue his anti-TB  medications for now.  The health department has been notified by the  infectious disease doctor of the patient's miliary tuberculosis.  The  patient is in no need for isolation secondary to negative AFB smears.  The patient will follow up with Dr. Lina Sayre of infectious diseases  on April 26, 2009 at 10:00 a.m. for followup on his miliary tuberculosis.  On followup, the patient will  need a comprehensive metabolic profile to  followup on his liver function tests.  The patient is also to follow up  with his PCP in the next 1-2 weeks and post discharge from the inpatient  rehab unit.  On followup, the patient will need a comprehensive  metabolic profile done, as well as CBC and the patient's PCP will need  to followup on the patient's lupus anticoagulant.   CONSULTATIONS:  1. Infectious disease consult was done.  The patient was seen in      consultation on Mar 24, 2009 per Dr. Sampson Goon and followed      throughout the hospitalization by Dr. Lina Sayre.  2. The patient was seen by [facility], Dr. Marchelle Gearing on Mar 25, 2009 and followed throughout the hospitalization by Dr. Craige Cotta.  3. The patient was seen in consultation by Dr. Cyndie Chime of      Hematology/Oncology on Mar 28, 2009.   PROCEDURES PERFORMED:  1. A chest x-ray was done on Mar 22, 2009 which showed diffuse  reticular nodular density in the lungs bilaterally, right greater      than left.  This could be due to chronic lung disease.      Alternatively there may be an element of pneumonia or mild edema,      hyperinflation with chronic lung disease.  2. CT of the head without contrast was done on Mar 22, 2009 that      showed no acute abnormality.  3. Chest x-ray was done on Mar 23, 2009 that showed persistent      reticular densities throughout the right lung.  Differential      includes atypical infection versus asymmetric edema.  4. Abdominal x-ray was done on Mar 23, 2009 that showed no evidence      for bowel obstruction or ileus.  Left flank excluded from the field      of view.  5. CT of the chest without contrast was done on Mar 25, 2009 that      showed a miliary pattern of innumerable tiny nodules throughout      both lobes of all lungs.  Findings are worrisome for hematogenous      spread of tuberculosis.  Findings were discussed with the patient's      nurse who stated the  patient did have a known history of TB many      years ago and patient is currently on TB precautions.  Consider      bronchoscopy for further evaluation as clinically indicated.  Other      considerations for miliary pattern of nodules include metastatic      disease, particularly with thyroid cancer or other fungal      infection.  There are several large of pulmonary nodules as      described above in the right upper lobe, right lower lobe, and      lingula which may be related to active infection such as      tuberculomas or airspace nodules, but neoplasm cannot be excluded      by CT.  Short-term follow-up chest CT in 3 months would be      suggested.  Small bilateral pleural effusions and bibasilar      atelectasis.  6. An abdominal ultrasound was done on Mar 26, 2009 that was negative      for evidence of cirrhosis or acute abnormality of the abdomen.      Right pleural effusion.  7. A bronchoscopy was done on Mar 27, 2009.  8. Lower extremity Dopplers were done on Mar 24, 2009 that showed no      evidence of deep vein or superficial thrombosis involving the right      lower extremity or left lower extremity.  No evidence of Baker's      cyst on the right or left.  9. A 2-D echocardiogram was done on Mar 23, 2009 which showed left      ventricular wall thickness was increased in a pattern of mild LVH.      Systolic function was normal, EF of 60-65%.  Although no diagnostic      regional wall motion abnormality was identified, this possibility      cannot be completely excluded on the basis of this study.  Atrial      septum:  No defect or patent foramen ovale was identified.   ADMISSION HISTORY AND PHYSICAL:  [name] is a 77 year old  [place] gentleman hospitalized December 29, 2008 for abdominal pain.  The  patient also with a history of BPH, hypertension, hemorrhagic CVA  x2, status post left occipital craniotomy July 2000.  He presented to  the ED with an  elevated white count and anemia from the PCP's office.  Per son, the patient had a 2-week history of recurrent headaches and  abdominal pain relieved by Tylenol with some decreased urinary stream,  pain on urination, fevers, chills, generalized weakness, and lethargy.  The patient denied any cough, no upper respiratory symptoms.  No chest  pain or shortness of breath.  No diarrhea, no constipation, no nausea or  vomiting, no melena or hematemesis, no hematochezia, no nuchal rigidity.  No focal neurological symptoms.  No confusion.  No sick contacts.  No  tick bites.  The patient had presented his PCP office and was noted to  have a leukocytosis and anemia and at that time, a rash was noted on his  lower back.  The patient was given IM Rocephin and sent to the emergency  department in the ED, a urinalysis was obtained that was bland.  CBC  with a white count of 14.5, hemoglobin of 9.9, platelets of 160, ANC of  11.4.  Comprehensive metabolic profile with a sodium of 129, chloride of  94, otherwise within normal limits.  Ammonia level was within normal  limits.  Coags were within normal limits.  Blood cultures were pending  at that time.  The patient was given some IV Rocephin and vancomycin in  the ED secondary to concerns of meningeal toxemia.  A chest x-ray showed  a diffuse bilateral right greater than left reticular nodular density  which could represent a pneumonia and we were called to admit the  patient.  At time of the interview, family had stated that the patient's  rash had improved significantly.   PHYSICAL EXAMINATION ON ADMISSION:  Temperature 99.1 up to 101.7, blood  pressure 129/70, pulse of 78, respirations 24, satting 98% on room air.  GENERAL:  The patient was lethargic but in no apparent distress.  HEENT: Normocephalic, atraumatic.  Pupils equal, round and reactive to  light, and accommodation.  Extraocular movements intact.  Oropharynx was  clear.  No lesions or  exudate.  Neck was supple.  No lymphadenopathy.  Dry mucous membranes and no nuchal rigidity.  RESPIRATORY:  Mildly coarse bibasilar breath sounds, fair air movement.  CARDIOVASCULAR:  Regular rate and rhythm with a 3/6 systolic ejection  murmur.  ABDOMEN:  Soft, nontender, nondistended, positive bowel sounds.  EXTREMITIES:  No clubbing, cyanosis or edema.  NEUROLOGICAL:  The patient was alert and oriented x3.  Cranial nerves II-  XII are grossly intact.  No focal deficits.  SKIN:  Scattered lower back and torso maculopapular rash, as well as on  the upper arms.   ADMISSION LABORATORY DATA:  CBC:  White count 14.5, hemoglobin 9.9,  hematocrit 30.7, platelets of 160, ANC of 11.4, absolute monocytes  greater than 1.7.  Urinalysis was yellow, cloudy, specific gravity  1.016, pH of 7.5, glucose negative, bilirubin negative, ketones  negative, blood negative protein negative, urobilinogen 1.0, nitrite  negative, leukocyte negative.  Comprehensive metabolic profile with a  sodium of 129, potassium 4.1, chloride 94, bicarb 28, glucose 117, BUN  9, creatinine 0.76, bilirubin 0.6, alk phosphatase 65, AST 34, ALT 33,  protein of 7.4, albumin of 3.0, calcium of 9.2, ammonia level of 27.  PTT of 47, PT of 15.3, INR of 1.2.  Blood cultures were pending.  CT of  the head  without contrast showed no acute abnormality.  Chest x-ray  showed diffuse reticular nodular density in the lungs bilaterally, right  greater than left, could be due to chronic lung disease, alternatively  may be due to an element of pneumonia or mild edema, hyperinflation with  chronic lung disease.   HOSPITAL COURSE:  1. 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  The patient was initially admitted      per chest x-ray with an elevated white count and fevers, as well      and initially felt maybe likely due to a pneumonia.  Sputum Gram's      stain and culture were obtained which came back as nonpathogenic      oral pharyngeal flora.  Urine  Legionella and pneumococcus antigens      were also obtained which were negative.  Blood cultures were also      obtained.  The patient was hydrated with IV fluids and empirically      started on IV vancomycin and Zosyn.  A urine GC and chlamydia was      also obtained to rule out meningeal toxemia which came back      negative.  The patient still did have some fevers on the second day      of the hospitalization with further increases in his leukocytosis      and as such,infectious diseases were consulted on Mar 24, 2009.      The patient was seen at the time by Dr. Sampson Goon.  Doppler      ultrasounds were obtained of the lower extremities which came back      negative.  The patient was in consultation by Dr. Sampson Goon on Mar 24, 2009.  There was some concern of possible tuberculosis.  The      patient had already been isolated.  A PPD was also placed on the      patient which came back negative.  A CT of the chest was done with      results stated above on Mar 25, 2009 that showed tiny pulmonary      nodules in a miliary pattern, as well as multiple pulmonary      nodules.  The patient's vancomycin was then discontinued.  The      patient was maintained on Rocephin at that time.  Pulmonary was      consulted on Mar 25, 2009.  The patient was seen in consultation by      Dr. Marchelle Gearing and the patient was seen for bronchoscopy.      Bronchoscopy was done on Mar 27, 2009 and at the time, AFB smears      and cultures, as well as fungal cultures and biopsies were      obtained.  AFB smears came back negative.  Fungal cultures also      came back negative.  Histoplasma urine antigen and culture was also      obtained which came back negative as well.  AFB was checked in the      patient's blood and urine which also came back negative.  The      patient's Rocephin was then also discontinued on Mar 25, 2009 and      the patient after his bronchoscopy was started on a four-drug       regimen for probable miliary tuberculosis of isoniazid, ethambutol,      pyrazinamide, and rifampin.  A hepatitis B antigen was also  obtained and antibody which came back negative.  Right upper      quadrant ultrasound was done which also came back negative.  With      the start of the patient on the four-drug anti-tuberculosis      medications, the patient's fevers improved and his white count      decreased.  The patient remained in a stable and improved      condition.  Biopsies from bronchoscopy came back as lung tissue      with necrotizing granulomas with no microorganisms or malignancy      identified which was consistent with tuberculosis.  The patient was      followed throughout the hospitalization by infectious disease and      pulmonary.  The patient's isolation was discontinued secondary to a      negative AFB smears.  The patient will be discharged to the      inpatient rehab facility to continue on his four-drug anti-TB      medications, as well as vitamin B6 daily.  The patient will follow      up with infectious disease doctor, Dr. Lina Sayre on April 26, 2009 and the health department as also be notified per infectious      diseases.  The patient will be transferred to rehab in a stable and      improved condition.  2. Anemia of chronic disease.  The patient was found to be anemic      during the hospitalization.  The patient's hemoglobin remained      stable.  An anemia panel was drawn which came back consistent with      an anemia of chronic disease.  The patient's iron level on the      anemia panel was 15 with a TIBC of 141, B12 of 504, folate level of      16.5, and ferritin of 2140.  The patient remained stable and      asymptomatic and his H and H remained stable throughout the      hospitalization.  3. Abnormal coagulation profile.  During the hospitalization, coags      were obtained.  The patient was noted to have an elevated PTT and      PT.  A  mixing study was done which came back abnormal.      Hematology/Oncology was consulted per ID.  The patient was seen in      consultation by Dr. Cyndie Chime on Mar 28, 2009 at which time      further labs were drawn.  A von Willebrand profile was done which      came back within normal limits.  A factor VIII level was also drawn     which came back elevated and was felt to be secondary to acute      phase reactant.  A factor X level was also drawn which was just      slightly decreased and felt not to be clinically significant.  A      factor VIII level was also drawn which was pending.  It was felt      per Hematology/Oncology that no specific intervention was needed.      Lupus anticoagulant was also checked and will need to be followed      up on per the patient's primary care physician.  The patient      remained in stable condition and did not have  any bleeding      diatheses and the patient will be discharged in stable condition.  4. Hyponatremia.  During the admission, the patient was noted to be      hyponatremic on admission.  It was felt the patient's hyponatremia      was secondary to hypovolemia and the patient was hydrated with IV      fluids with improvement in his sodium such that by day of      discharge, the patient's sodium was 134.  5. Orthostasis.  The patient was noted to be orthostatic.  During the      hospitalization, the patient was hydrated with IV fluids and the      patient's orthostasis had resolved by day of discharge.  6. Dehydration.  The patient was noted to be clinically dehydrated on      admission.  The patient was hydrated with IV fluids and monitored.      The patient was euvolemic by day of discharge.  7. BPH.  The patient did have a history of BPH during the      hospitalization.  Initially, the patient was monitored and      maintained on his home dose of Flomax.  The patient started to      complain of some lower abdominal pain.  A bladder scan  was done and      the patient was noted to have residuals of 730 mL.  A Foley      catheter was placed with symptomatic improvement.  The patient was      monitored and maintained on the Foley catheter.  The patient's      Foley catheter will be discontinued on discharge.  The patient will      be followed up in  inpatient rehab and monitored and 3-4 hours post      removal of the Foley catheter if the patient does have decreased      urine output, may consider another bladder scan and in and out      caths can be done and the patient can be urinary trained and if no      improvement, may consider urology consult was during rehab.   The rest of the patient's chronic medical issues were stable throughout  the hospitalization and the patient will be discharged in a  stable and  improved condition.   On day of discharge, vital signs were temperature 97.9, blood pressure  110/72, pulse of 69, respirations 18, satting 94% on room air.   Discharge labs:  CBC:  White count 3.1, hemoglobin 8.5, platelets of  285, hematocrit 25.8.  BMET:  Sodium 134, potassium 3.6, chloride 105,  bicarb 22, BUN 12, creatinine 0.69, and glucose of 98 with a calcium of  8.1.   It was a pleasure taking care of Mr. Dossie Swor.      Ramiro Harvest, MD  Electronically Signed     DT/MEDQ  D:  03/30/2009  T:  03/30/2009  Job:  [id number]   cc:   Tally Joe, M.D.  Fransisco Hertz, M.D.  Mick Sell, MD  Genene Churn. Cyndie Chime, M.D.  Coralyn Helling, MD  Kalman Shan, MD

## 2011-03-25 NOTE — Discharge Summary (Signed)
NAMECAYLE, [name]              ACCOUNT NO.:  [id number]   MEDICAL RECORD NO.:  [id number]          PATIENT TYPE:  IPS   LOCATION:  4025                         FACILITY:  [facility]   PHYSICIAN:  Ranelle Oyster, M.D.DATE OF BIRTH:  29-Nov-1933   DATE OF ADMISSION:  03/30/2009  DATE OF DISCHARGE:  04/06/2009                               DISCHARGE SUMMARY   DISCHARGE DIAGNOSES:  1. 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  2. Hypertension.  3. Anorexia.  4. Hyperactive bladder, question benign prostatic hypertrophy.  5. Depressive mood.   HISTORY OF PRESENT ILLNESS:  [name] is a 77 year old male with  history of hemorrhagic CVA x2 with right hemiparesis, hypertension,  admitted via PCP's office with abdominal pain and 2-week history of  headaches.  The patient is noted to be hyponatremic with sodium at 129  and white count at 14.5, as well as anemia with hemoglobin at 9.9.  Chest x-ray done showed a right greater than left reticular density,  possible pneumonia.  CT of chest showed miliary pattern, worrisome for  heterogeneous spread of TB and several large pulmonary nodules in right  upper lobe and right lower lobe.  A CT of head done showed chronic  ischemic changes.  KUB showed gas and stool in colon, no evidence of  ileus.  The patient was started on IV antibiotics past admission,  Pulmonary was consulted for bronchoscopy.  This was done on Mar 27, 2009, with path being positive for necrotizing granulomas, no AFB  identified on Gram stain.  The patient with history of TB in 35s.  ID  was consulted for input and felt the patient with miliary TB, and the  patient was started on 4-drug regimen for treatment.  The patient was  noted to have elevated PTT with question of coagulopathy, and Dr.  Cyndie Chime was consulted for input.  Workup was without significant  specific change, inhibited  coagulation or Von Willebrand disease.  Lupus  anticoagulant was ordered.  The patient's fevers are noted to be on  downward trend.  He currently continues with poor p.o. intake and is on  pureed diet per family's request.  He has had some issues with bladder  spasms requiring placement of Foley on Mar 24, 2009.  This was  discontinued on Mar 30, 2009, with initiation of voiding trial.  Therapies are ongoing, and the patient is noted to have issues with  decreased endurance, tends to drag right lower extremity with increased  distance.  He is noted to be limited by fatigue.   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   ALLERGIES:  PENICILLIN.   FAMILY HISTORY:  Positive for coronary artery disease.   SOCIAL HISTORY:  The patient is married, lives in 1-level home with 1  step at entry, does not use any alcohol or tobacco.   FUNCTIONAL HISTORY:  The patient was independent for  ambulating in his  room.  He does not drive.  Uses a power wheelchair to get around the  house.   FUNCTIONAL STATUS:  The patient is min assist with supervision for  transfers, min assist for ambulating 42 feet with quad cane.   PHYSICAL EXAMINATION:  VITAL SIGNS:  Blood pressure 113/59, pulse 87,  temperature 100.5, respiratory rate 18.  GENERAL:  A frail Asian male, lying in bed, in no acute distress.  HEENT:  Pupils equal, round, and reactive to light and accommodation.  Oral mucosa moist.  Borderline dentition noted.  NECK:  Supple without JVD or lymphadenopathy.  CHEST:  Clear to auscultation bilaterally without wheezes, rales, or  rhonchi.  HEART:  Regular rate rhythm without murmurs, gallops, or rubs.  ABDOMEN:  Soft, nontender with positive bowel sounds.  EXTREMITIES:  No evidence of clubbing or cyanosis, only trace edema in  right lower extremity.   SKIN:  Intact without any obvious evidence of breakdown.  NEUROLOGIC:  Alert and oriented x2.  Voice volume is low.  The patient  has residual right facial droop and tongue deviation with mild sensory  loss in right face, right arm and leg and display 1/2 sensation to  pinprick and light touch.  Strength is decreased to 3- to 4/5 in the  right upper extremity and lower extremity.  He has contracture of right  shoulder.  Reflexes are hyperactive at 3+ on the right.  Left upper and  left lower extremity motor and sensory exam are normal.  Judgment,  orientation, memory are fairly intact although there is a language  barrier with wife translating.  Affect is extremely flat.   HOSPITAL COURSE:  [name] was admitted to rehab on Mar 30, 2009, for inpatient therapies to consist of PT/OT at least 3 hours 5  days a week.  Past admission, physiatrist, rehab RN, and therapy team  have worked together to provide customized collaborative  interdisciplinary care.  Rehab RN has been working with the patient on  bowel and bladder program.  The patient's voiding was monitored with PVR  checks, and the patient was noted to have issues with retention with  bladder volumes at 350-400 mL.  The patient was started on Flomax 0.4 mg  nightly, as well as Urecholine 10 mg q.i.d., with improvement in his  voiding function.  Last PVRs checked, were noted to be at 50-70 mL.  The  patient did report increasing frequency past addition of Urecholine, and  this was discontinued at the time of discharge.  UA/UC was done, and  this was noted to be negative.  Labs done past admission revealed sodium  136, potassium 4.0, chloride 103, CO2 26, BUN 10, creatinine 0.75,  glucose 101.  LFTs revealed AST 28, ALT 30, T. bili 1.2, albumin 2.3,  calcium 8.9.  CBC revealed leukopenia with white count at 2.6, H and H  were stable at 9.5 and 29.1 and platelets were noted be clumped.  Daily  CBCs were done during this stay,  and the patient's TB meds were placed  on hold x24 hours.  Recheck labs of Apr 03, 2009, showed white count on  an upward trend at 3.1 with platelets at 267.  The patient's rifampin,  ethambutol, isoniazid, and pyrazinamide were resumed on Apr 04, 2009.  CBC at the time of discharge reveals hemoglobin 9.3, hematocrit 28.8,  white count 3.3, platelets 283.  The patient has been afebrile during  this stay.  Blood pressures  have been checked on b.i.d. basis, and these  were noted to be on downward trend, ranging from high 90s to 110  systolic, 60s to 16X diastolic.  The patient's Toprol-XL has slowly been  decreased to 50 mg p.o. per day.  Blood pressure at the time of  discharge on a.m. of discharge is at 119/69.  Last weight is at 50 kg   During the patient's stay in rehab, team conference was held to monitor  the patient's progress, set goals, as well as discuss barriers to  discharge.  Rehab RN has been working with the patient and family by  providing supplements to help the patient maintain his nutritional  status.  Family also is bringing meals from home to help the patient to  have increased food choices.  At the time of admission, the patient was  noted to be limited by decrease in endurance, decreased balance,  decreased ability to transfer.  He was noted to have a generalized  weakness in left upper extremity, left lower extremity with decreased  range of motion in right upper extremity and right lower extremity and  no functional gait speed with impaired sensation.  PT evaluation  revealed the patient at mod assist for transfers, min to mod assist for  bed to wheelchair, total assist for wheelchair mobility.  He was noted  to be very fatigued with little activity with poor motivation.  He was  able to ambulate up to 5-10 feet with left cane with mod assist due to  right footdrop and slow cadence.  The patient was started on Lexapro for  depression to help with his mood during  this stay.  By the time of  discharge, the patient has progressed to being at supervision level for  dynamic standing balance, supervision level for transfers, supervision  level for ambulating 40 feet with cane.  Physical therapy has been  working with the patient on standing balance within the patient's base  of support without assisted device.  He is at modified independent level  with assisted device, min assist for greater challenges.  Family  education was completed with son, as well as wife, to include balance,  gait, transfers, car transfers, as well as dynamic balance activities,  such as, reaching forward and weight shifting with min assist at this  time.  OT evaluation revealed the patient having ability to stand 25% of  time for peri care.  He was able to dress with min assist with increased  time.  OT has been working with the patient on self-care, retraining at  shower level.  They have also been working with activity tolerance,  standing endurance, and grooming tasks with encouragement for the  patient to engage and utilize left upper extremity for self-care.  The  patient made slow steady progress toward OT goals.  The patient reported  little to no value in increased functional level with the ADLs yet  occasionally agreed to participate in OT resulting in minimal  improvement.  The patient reports that family will assist him as needed  past discharge.  The patient will continue with further followup home  health, PT/OT by advanced home care.  The patient however reports that  he sees no need for further followup therapies.  Family has been  instructed regarding the need to continue PT/OT past discharge to help  the patient to gain increased independence, as well as to help the  patient's endurance issues and functional mobility.  They will continue  to encourage the patient towards more independence goals.  On Apr 06, 2009, the patient is discharged to home.   DISCHARGE  MEDICATIONS:  1. Flomax 0.4 mg nightly.  2. Flonase nasal spray 2 squirts in nostrils daily.  3. FiberCon 2 p.o. at bedtime.  4. Toprol-XL 50 mg per day.  5. Lexapro 5 mg nightly.  6. Isoniazid 200 mg per day.  7. Myambutol 400 mg 3 p.o. per day.  8. Pyrazinamide 500 mg 2-1/2 p.o. per day.  9. Rifampin 300 mg 2 p.o. per day 1 hour before meals or 2 hour after      meals.  10.Vitamin B6 100 mg a day.   DIET:  High protein diet.   ACTIVITY LEVEL:  A 24-hour supervision.  No strenuous activity.  No  alcohol.  No smoking.  No driving.   SPECIAL INSTRUCTIONS:  [facility] to provide PT, OT.  Do not use  Altace.   FOLLOWUP:  The patient is to follow up with Dr. Riley Kill on May 21, 2009,  at 10:30 a.m.; follow up with Dr. Azucena Cecil for routine check, follow up  with Dr. Lina Sayre on April 26, 2009, at 10:00 a.m., follow up with  Dr. Gaynelle Arabian for input regarding voiding function on April 23, 2009,  at 12:45.      Greg Cutter, P.A.      Ranelle Oyster, M.D.  Electronically Signed    PP/MEDQ  D:  04/06/2009  T:  04/07/2009  Job:  [id number]   cc:   Tally Joe, M.D.  Ronald L. Earlene Plater, M.D.  Fransisco Hertz, M.D.

## 2011-03-25 NOTE — H&P (Signed)
NAMEDEUNTE, BLEDSOE              ACCOUNT NO.:  [id number]   MEDICAL RECORD NO.:  [id number]          PATIENT TYPE:  INP   LOCATION:  1829                         FACILITY:  [facility]   PHYSICIAN:  Ramiro Harvest, MD    DATE OF BIRTH:  1934/01/21   DATE OF ADMISSION:  03/22/2009  DATE OF DISCHARGE:                              HISTORY & PHYSICAL   PRIMARY CARE PHYSICIAN:  Tally Joe, M.D. of [facility].   HISTORY OF PRESENT ILLNESS:  [name] is a 77 year old [place]  gentleman hospitalized December 29, 2008 for abdominal pain.  He also  has a history of BPH, hypertension, hemorrhagic CVA x2, status post left  suboccipital craniotomy in July 2000 who presented to the ED with an  elevated white count and anemia from PCP's office.  Per son, the patient  has had a 2-week history of recurrent headaches and abdominal pain  relieved by Tylenol, decreased urinary stream, pain on urination,  fevers, chills, generalized weakness and lethargy.  The patient denied  any cough, no upper respiratory symptoms.  No chest pain, no shortness  of breath, no diarrhea, no constipation, no nausea or vomiting.  No  melena, no hematemesis, no hematochezia.  No nuchal rigidity, no focal  neurological symptoms.  No confusion.  No sick contacts.  No tick bites.  The patient presented to the PCP's office, was noted to have a  leukocytosis and anemia and at that time a rash was noted on his lower  back.  The patient was given Rocephin IM and sent to the ED.  In the ED  urinalysis was bland.  CBC with a white count of 14.5, hemoglobin of  9.9, platelets of 160, ANC of 11.4.  CMET with a sodium of 129, chloride  of 94, otherwise was within normal limits.  Ammonia level was within  normal limits.  Coags were within normal limits.  Blood cultures were  pending.  The patient was given IV Rocephin and vancomycin in the ED  secondary to possible concerns of meningeal toxemia.  Chest x-ray showed  diffuse bilateral right greater than left reticular nodular density  which could possibly represent a pneumonia.  We were called to admit the  patient.  At the time of the interview family had stated that his rash  had improved significantly.   ALLERGIES:  No known drug allergies.   PAST MEDICAL HISTORY:  1. History of hypertension.  2. BPH.  3. Inguinal right hernia.  4. Status post hemorrhagic CVA of the right cerebellum in July of 2000      status post suboccipital craniotomy for evacuation of right      cerebellar hematoma.  Also a hemorrhagic CVA in the left thalamic      and parietal region July of 2004.   HOME MEDICATIONS:  1. Altace 10 mg p.o. daily  2. Toprol XL 200 mg p.o. daily.  3. Flonase nasal spray 2 sprays per nostril daily.   SOCIAL HISTORY:  The patient is married, uses a powered wheelchair to  get around.  No tobacco use.  No alcohol use.  No  IV drug use.   FAMILY HISTORY:  Noncontributory.   REVIEW OF SYSTEMS:  As per HPI, otherwise negative.   PHYSICAL EXAM:  Temperature 99.1 up to 101.7, blood pressure 129/70,  pulse of 78, respirations 24, satting 98% on room air.  GENERAL:  Patient lethargic in no apparent distress.  HEENT:  Normocephalic, atraumatic.  Pupils equal, round and reactive to  light.  Extraocular movements intact.  Oropharynx is clear.  No lesions,  no exudates.  NECK:  Supple.  No lymphadenopathy.  Dry mucous membranes and no nuchal  rigidity.  RESPIRATORY:  Mild coarse bibasilar breath sounds.  Fair air movement.  CARDIOVASCULAR:  Regular rate and rhythm with a 3/6 systolic ejection  murmur.  ABDOMEN:  Soft, nontender, nondistended.  Positive bowel sounds.  EXTREMITIES:  No clubbing, cyanosis or edema.  NEUROLOGICALLY:  The patient is alert and oriented x3.  Cranial nerves  II-XII are grossly intact.  No focal deficits.  SKIN:  With scattered lower back and torso maculopapular rash as well as  on the upper arms.   LABORATORIES:  CBC:   White count 14.5, hemoglobin 9.9, hematocrit 30.7,  platelet count 160, ANC of 11.4, absolute monocytes over 1.7.  A  urinalysis was yellow, cloudy.  Specific gravity 1.016, pH of 7.5,  glucose negative, bilirubin negative, ketones negative, blood negative,  protein negative.  Urobilinogen 1.0, nitrite negative, leukocytes  negative.  Comprehensive metabolic profile:  Sodium of 129, potassium  4.1, chloride 94, bicarb 28, glucose 117, BUN 9, creatinine 0.76,  bilirubin 0.6, alk phosphatase 65, AST 34, ALT 33, protein 7.4, albumin  3.0, calcium of 9.2, ammonia level of 27, PTT of 47, PT of 15.3, INR of  1.2.  Blood cultures were pending.  CT of the head without contrast  showed no acute abnormality.  Chest x-ray showed diffuse reticular  nodular density in the lungs bilaterally, right greater than left.  This  could be due to chronic lung disease.  Alternatively there may be an  element of pneumonia or mild edema, hyperinflation with chronic lung  disease.   ASSESSMENT AND PLAN:  1. Probable pneumonia per chest x-ray.  The patient with a fever,      elevated white count.  However, no respiratory symptoms.  We will      check a sputum Gram stain and culture.  Check a urine Legionella      and pneumococcus antigen.  Blood cultures are pending.  Repeat      chest x-ray in the morning.  Hydrate with intravenous fluids,      empiric intravenous vancomycin and Rocephin and follow.  2. Rash, questionable etiology.  Differential includes a meningeal      toxemia (the patient with recurrent headaches, fevers, rash      elevated white count and lethargy versus possibly questionable      syphilis).  Family fell as the patient was being treated with      Rocephin he did not need to be put through a lumbar puncture.  We      will check a urine GC and Chlamydia.  Check a RPR.  Check blood      cultures x2.  Treat empirically with intravenous Rocephin and      vancomycin and monitor.  If no  improvement, may consider a      Dermatology versus Infectious Disease consult.  3. Dehydration.  Intravenous fluids.  4. Leukocytosis.  Likely a questionable secondary to problem #1 versus  secondary to problem #2.  Urinalysis was negative.  Blood cultures      x2 were pending.  Treat empirically with intravenous antibiotics      and monitor.  5. Hyponatremia, likely secondary to hypovolemic hyponatremia.  Check      a TSH.  Check orthostatics.  Check a fractional excretion of      sodium.  CT of the head was negative.  Chest x-ray with bilateral      nodular densities.  Hydrate with intravenous fluids and follow.  6. Anemia.  Check an anemia panel, guaiac stools.  Follow hemoglobin      and hematocrit.  7. Hypertension.  Hold blood pressure medications.  8. History of hemorrhagic cerebrovascular accident x2.  9. Murmur.  Check an electrocardiogram.  Check a two-dimensional      echocardiogram.  10.Benign prostatic hypertrophy.  Once the patient is hydrated and      euvolemic may consider starting the patient on      probable Flomax to see whether that improves his a urinary stream.      We will place a Foley catheter for now.  11.Prophylaxis.  Protonix for gastrointestinal prophylaxis.      Sequential compression devices for deep vein thrombosis      prophylaxis.  It has been a pleasure taking care of Mr. Kajuan Guyton.      Ramiro Harvest, MD  Electronically Signed     DT/MEDQ  D:  03/22/2009  T:  03/22/2009  Job:  [id number]   cc:   Tally Joe, M.D.

## 2011-03-25 NOTE — Consult Note (Signed)
NAMEDERREON, CONSALVO              ACCOUNT NO.:  [id number]   MEDICAL RECORD NO.:  [id number]          PATIENT TYPE:  INP   LOCATION:  3702                         FACILITY:  [facility]   PHYSICIAN:  Kalman Shan, MD   DATE OF BIRTH:  07/12/1934   DATE OF CONSULTATION:  03/26/2009  DATE OF DISCHARGE:                                 CONSULTATION   CONSULT REQUESTED BY:  Deboraha Sprang Hospitalist, [facility] and Dr.  Clydie Braun of Infectious Diseases   REASON FOR CONSULTATION:  Bronchoscopy.   History elicited from son.  Review of the old chart dating back to  August 03, 2003 and also review of the current notes and also talking  to the son and wife.   HISTORY OF PRESENT ILLNESS:  Mr. Carrozza is a 77 year old [place]  gentleman was who hospitalized December 29, 2008 for abdominal pain.  He  presented to the ED on Mar 22, 2009 with a 2-week history of recurrent  headaches and abdominal pain.  The son says this was relieved by  Tylenol, but the effects are only transient for 4 hours.  The symptoms  are mild. There are no specific radiation or aggravating or relieving  factors other than the Tylenol.  In addition, he is having some  decreased urinary stream and dysuria  The son denies any fever, chills,  cough, hemoptysis, diarrhea, constipation, nausea, vomiting, melena,  hematemesis, hematochezia, or confusion.  There is no sick contacts.  No  sick bites.  Significantly, the patient has been more weak and decreased  functional status than his baseline.  It son says his baseline  functional status is around 3 ever since is massive stroke in 2004 but  since the current onset of symptoms his functional status is decreased  to 1..   Chest x-ray and CT scan were done.  The chest x-ray showed nodular  densities. This led to a CT scan of the chest that shows some additional  nodules with trace bilateral pleural effusions. Pulmonary is now being  consulted for bronchoscopy to  rule out pulmonary tuberculosis.  PPD was  placed on Mar 24, 2009 and at this point it is still negative.  CT scan  of the abdomen and pelvis did not show anything specific other than some  stool suggestive of obstipation and inguinal hernia.   ALLERGIES:  No known allergies.   PAST MEDICAL HISTORY:  1. History of hypertension.  He is on metoprolol.  2. Benign prostatic hypertrophy.  3. Inguinal right hernia.  4. 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  5. He is from [place].  He moved here 1975.  In 1970 he was while in      [place] he was told  that he had some lung problem.  He was given      something for  treatment for 2 months and then was told that he no      longer needed it.  Beyond  that he does not know any details .   REVIEW OF SYSTEMS:  This is done in detail as per HPI.  At baseline the  patient uses a wheelchair. He can stand and clean his dishes with one  hand.  He can converse normally.  He can also eat by himself but since  the onset of current symptoms he has declines considerably. Detailed 13  point ROS is otherwise negative.   HOME MEDICATIONS:  1. Altace.  2. Toprol.  3. Flonase nasal spray.   CURRENT MEDICATIONS:  .  Protonix.  1. Docusate.  2. MiraLax.  3. Flomax.  4. Metoprolol.  5. Mechanical  prophylaxis with SCDs.  6. Tylenol.  7. Zofran p.r.n.  8. Darvocet-N.  9. Morphine p.r.n..  10.Of note, he is not on any heparin for DVT prophylaxis.   SOCIAL HISTORY:  He was in [employer] in [place].  In [place] he came to  the [place]. 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  He retired at the  age of 77.  He never smoked, never drank.  He use to play tennis. His  son [name] works in the emergency room as a Ship broker.  No IV  drug use.   FAMILY HISTORY:  Reveals that his mother died in her 77s of unknown  causes.  Father  died in his 32s of natural causes.  No brothers or  sisters..   PHYSICAL EXAM:  VITAL SIGNS:  Temperature is 100.3.  Of note is T-max  yesterday was 100-102.7.  He has had low-grade temperatures that as been  ongoing, pulse of 94, respiratory rate of 18, blood pressure 131/80,  pulse ox of 95% on room air, sugar of 116.  GENERAL:  He is a frail man lying in bed.  NEUROLOGIC:  Alert and oriented x3, looks chronically unwell. A  psychiatric flat affect.  He has residual effects from his old stroke  with spastic right hemiparesis.  RESPIRATORY:  His trachea is central. Air entry is equal on both sides.  Percussion is normal.  Overall clear.  HEENT:  No elevated JVP.  No neck nodes.  Upper airway [place] class  II.  CARDIOVASCULAR:  Normal heart sounds. Regular rate and rhythm.  No  murmurs.  ABDOMEN:  Abdomen is soft, normal bowel sounds.  He tends to guard is  abdomen during palpation but I cannot get any rebound or any specific  tenderness.  EXTREMITIES:  No cyanosis, no clubbing or edema.  SKIN:  Skin exam is grossly intact.  MUSCULOSKELETAL:  Joints do not have any obvious deformities.  LYMPHATICS:  I could not get any axillary lymph nodes or neck lymph  nodes.   LABORATORY VALUES:  CT scan of the chest was reviewed personally.  The  CT scan of the chest showed tiny nodules throughout both lobes of lungs.  Particularly in the right upper lobe there was slightly larger nodules.  Radiologist felt that this was worrisome for hematogenous spread of  tuberculosis. The larger nodule seem to be in the right upper lobe,  right lower lobe and lingula. I agree with the report by Dr. Britta Mccreedy of Radiology.   Lab tests: Lab tests include PTT is prolonged at 47 seconds and in fact  was prolonged given on May 30th at 47 seconds and February 2010 it was  prolonged in the 30s.  The INR is slightly prolonged at 1.3 and PT at  16.1 seconds.   Hepatitis B surface  antigen was positive.    The PSA is elevated at 5.84.  Hepatitis C antibody negative, hepatitis B  surface antibody negative.   Blood culture Mar 24, 2009 is negative.   Urine culture Mar 23, 2009 is negative.   CBC from Mar 25, 2009:  White count 12.5, hemoglobin 10.4, platelets  were clumped but on May 15th again the platelets were clumped but on the  smear review the count appeared increased.   Mar 23, 2009 chlamydia probe was negative.  The RPR syphilis screen was  nonreactive.  The TSH was 0.966.   HIV is pending.  Strep pneumoniae antigen is negative.   I discussed the prolonged PT/PTT with the Hematology lab. Patient is not  on a heparin so a PT/PTT mixing studies have been requested.  Vitamin K  is also going to be given after the PT/PTT mixing studies have been  sent.   ASSESSMENT AND PLAN:  1. 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 This 77 year old [place]      with possible pulmonary TB that was diagnosed in 1970 with a      history completely unclear. Currently has constitutional symptoms      of lethargy, some urinary symptoms and abdominal pain but the CT of      the abdomen, pelvis and urinalysis are negative. On the other hand      we have a CT chest that shows miliary nodules and some larger      nodules in the lungs suggestive of pulmonary tuberculosis.   1. Negative PPD on day 2 <but this could be due to energy and probably      needs be repeated in 1 week due to motion effect.    1. Bronchoscopy.  The patient requires bronchoscopy to evaluate the      etiology of the nodules.  Most likely clinical pretest probability      for tuberculosis given his ethnicity and possible prior lung      disease.  He is at low moderate risk for having complications of      bleeding, sedation risk and pneumothorax from bronchoscopy but      before doing bronchoscopy the etiology for his prolonged PTT needs      to be sorted out.  I do not see any heparin on his medications.      His PT is also  slightly prolonged so I wonder if both the prolonged      is due to malnutrition.   PLAN:  1. Etiology assessment for prolonged PT/PTT before bronchoscopy.  Will      send PT/PTT mixing studies and then give him vitamin K and check      this again.  2. Provisionally place him on bronchoscopy scheduled for Mar 27, 2009      at 7:00 a.m.  However, if the PT/PTT is prolonged I might not to      the bronchoscopy on that day.  3. Check ESR, check CRP, check PT/PTT, check BNP today.  4. Indication for bronchoscopy.  The son and wife are extremely      concerned about the risks of bronchoscopy.  This is a minor      procedure and that he could potentially tolerate the bronchoscopy      fine.  However, they feel that if treatment for tuberculosis is      going to be started regardless of the outcome of the bronchoscopy      they do not want a  bronchoscopy.  They do understand that      sensitivity of the tuberculosis could make an impact in his      therapy.  They do want to discuss this more with Infection      Diseases. I spoke personally with Dr. Sampson Goon of ID who strongly      feels bronch is indicated. I have conveyed it to the family. They      are awaiting to discuss with Dr. Sampson Goon more      Kalman Shan, MD  Electronically Signed     MR/MEDQ  D:  03/26/2009  T:  03/26/2009  Job:  [id number]   cc:   Mick Sell, MD  Tally Joe, M.D.

## 2011-03-25 NOTE — Consult Note (Signed)
[name], [name]              ACCOUNT NO.:  [id number]   MEDICAL RECORD NO.:  [id number]          PATIENT TYPE:  INP   LOCATION:  3702                         FACILITY:  [facility]   PHYSICIAN:  [name], M.D.DATE OF BIRTH:  Jan 03, 1934   DATE OF CONSULTATION:  03/28/2009  DATE OF DISCHARGE:                                 CONSULTATION   REQUESTING PHYSICIAN:  [name] Hertz, MD   This is a Hematology consultation requested by [name] to  evaluate this man for abnormal coagulation tests.   [name] is a 77 year old [place] man initially admitted to this  hospital on December 29, 2008, for acute lower abdominal pain and a rash  on his back.  No pathology was seen on ultrasound or CT scan of the  abdomen.  Specifically, there was no splenomegaly, no focal liver  abnormalities, no lymphadenopathy, and no gallbladder pathology.  He was  felt to be constipated.  He was given laxatives and discharged within 48  hours.  A chest radiograph showed a diffuse bilateral reticular nodular  pattern.  Lab work done during that admission on December 30, 2008, with  hemoglobin 11, hematocrit 35, MCV 76, white count 8400, 65 neutrophils,  18 lymphocytes, 9 monocytes.  Protime 13.7 seconds, PTT 39 seconds,  normal less than or equal to 37 seconds.  He had normal liver  chemistries.  Amylase slightly elevated at 154, lipase normal at 129.  Urinalysis without blood or protein.   He was readmitted on Friday, Mar 22, 2009, with symptoms of progressive  generalized weakness, generalized nonspecific pain, and fever to 101.7.  Initial white count was 14,300 with 81 neutrophils.  He had a persistent  anemia with hemoglobin 10 g.  Family reports that over the last few  weeks, he has been anorexic and eating primarily just fruits  They have  not noted any clinical bleeding or bruising.  They state that he seems  to clot briskly if he cuts himself shaving or on an extremity.  He  is  hypertensive and had two strokes in the past, both hemorrhagic, the  first involving the left cerebellum requiring a surgical drainage of  blood in July 2000.  The second stroke involving the thalamus in July  2004.  He is left with right hemiplegia.   A CT scan of the chest done during this admission shows diffuse miliary  nodules in both lungs with some scattered more prominent areas of  nodularity up to 1.9 cm in the right lung apex.  There is no hilar or  mediastinal lymphadenopathy.  The pattern is highly suspicious for  disseminated tuberculosis.   Repeat coagulation studies done during this admission show borderline  elevations of his protime up to 16.1 seconds with normal up to 15.2 and  mild elevation of his PTT in the range of 36 up to 47 seconds with  normal up to 37 seconds.  A repeat protime with a 1:1 mix with normal  plasma corrects the protime from 15.9 seconds baseline down to 13.9.  A  PTT plasma mix does not correct the  PTT, which was 43 seconds at  baseline and 40 seconds after mixed with plasma.   Additional pertinent lab this admission include a positive hepatitis B  surface antigen.   He has never had any problem with surgical procedures in the past  causing any bleeding.  He underwent a bronchoscopy with washings and  biopsies yesterday on Mar 27, 2009, without any problems.  Results are  pending.   PAST MEDICAL HISTORY:  Also includes hypertension, BPH, and elevated  PSA.   MEDICATIONS ON ADMISSION:  Include Altace and Toprol.   ALLERGIES:  He is allergic to PENICILLIN.   FAMILY HISTORY:  Unremarkable for anybody with a blood disorder.  He has  one son, [name], who works for [employer].  He has  brothers and sisters who are still in [place] and he has not been in  touch with them for years.   SOCIAL HISTORY:  He does not smoke or use alcohol.  He is married and  wife accompanies him today and provides medical history and  translation.  She speaks very good [place].   PHYSICAL EXAMINATION:  GENERAL:  An acute and chronically ill elderly  man.  VITAL SIGNS:  Highest temperature recorded was on Mar 26, 2009, when the  temperature was 103, present temperature is 97.2, pulse 70 and regular,  blood pressure 106/54, respirations 18, and oxygen saturation 96% on  room air.  SKIN:  Without ecchymoses or petechiae.  HEENT:  He is bald.  The pupils are equal and reactive to light.  There  is a scar in the posterior scalp, status post drainage of a subdural  hematoma at the time of initial stroke.  Pharynx, no erythema or  exudate.  LUNGS:  Overall clear and resonant to percussion.  HEART:  Regular cardiac rhythm.  No murmur.  LYMPHATICS:  No lymphadenopathy.  ABDOMEN:  Soft and nontender.  No masses, no organomegaly.  Foley  catheter in place.  EXTREMITIES:  No edema.  No calf tenderness.  NEUROLOGIC:  There is a right hemiplegia.   ADDITIONAL PERTINENT LABORATORIES:  Serum total protein is 7.4 with  albumin 3.0, bilirubin and transaminases are normal.  Most recent CBC  done this morning with hemoglobin 9.9, hematocrit 30, MCV 71.5, white  count 3200, 40% neutrophils, 40 lymphocytes, 16 monocytes, 4  eosinophils, platelets clumped on smear.   RPR is negative.   CBC on admission with white count 14,500.   Reticulocyte count is 0.7%, serum iron 26, TIBC 155, percent saturation  17, ferritin 1296, B12 521, folic acid 13.5.   Factor VIII activity is 263% of control (73-140).   Preliminary AFB stain on bronchial washings shows no AFB, no yeast, or  fungal elements.   DFA negative for Legionella on bronchial washings.   IMPRESSION:  1. Mild fluctuating elevations of both protime and PTT.  Protime      corrects with normal plasma, PTT does not.   My clinical impression is that we are seeing some nonspecific inhibitory  activity of his clotting factors in vitro due to underlying hepatitis B  and probable  active tuberculosis infection.   Recommendations:  Factor VIII level is normal, which excludes the most  potentially serious reason for an elevated  PTT, which would be the  development of an inhibitor to factor VIII coagulation factor.  This  leaves the possibility of nonspecific inhibition such as that seen with  a lupus type anticoagulant. Von Willebrand's disease would also be in  the differential for an isolated elevation of the PTT.  One would have  to postulate a deficiency of one of the other common pathway coagulation  factors to explain elevation of both PT and PTT.   I am going to check von Willebrand profile and a factor VII and factor X  activity level.  Given the normal factor VIII level, I think that it  would be safe to proceed with invasive procedures if they are indicated.   1. Microcytic anemia in an Asian male.   This is likely multifactorial.  Acute infection, iron deficiency, rule  out thalassemia trait.  Iron panel is consistent with anemia of chronic  disease pattern.  Not unexpectedly, ferritin is significantly elevated  and is likely an acute phase reactant.   Recommendation for the time being, I would defer any major evaluation of  his anemia and concentrate on treating his infection, which is being  done.  I will do a hemoglobin electrophoresis screen for thalassemia.   1. Acute neutropenia likely due to one of the multiple antibiotics he      has been started on during this hospitalization given the high      white count when he was admitted just 6 days ago.   ADDENDUM:  Old records from May 23, 2003, protime was 12.9 seconds, PTT  38 seconds.      [name] Churn. Cyndie Chime, M.D.  Electronically Signed     JMG/MEDQ  D:  03/28/2009  T:  03/29/2009  Job:  [id number]   cc:   [name], M.D.  [facility] Red Team  Kalman Shan, MD  Gordy Savers, MD

## 2011-03-25 NOTE — H&P (Signed)
NAMEVERMON, [name]              ACCOUNT NO.:  [id number]   MEDICAL RECORD NO.:  [id number]          PATIENT TYPE:  IPS   LOCATION:  4025                         FACILITY:  [facility]   PHYSICIAN:  Ranelle Oyster, M.D.DATE OF BIRTH:  [birth year]   DATE OF ADMISSION:  03/30/2009  DATE OF DISCHARGE:                              HISTORY & PHYSICAL   PRIMARY CARE PHYSICIAN:  Tally Joe, MD   NEUROLOGIST:  Genene Churn. Love, MD   CHIEF COMPLAINT:  Deconditioning.   HISTORY OF PRESENT ILLNESS:  This is a 77 year old [place] male with  history of hemorrhagic strokes x2 admitted via his primary care  physician's office with abdominal pain and 2-week history of headaches.  He was found to be hyponatremic with a sodium of 129, white count of  14.5.  Hemoglobin is 9.9.  Chest x-ray suggested possible pneumonia.  CT  of chest was notable for miliary pattern and worrisome for hematogenic  spread of TB with several large pulmonary nodules seen in the right  upper and right lower lobe.  Head CT was nonspecific.  KUB of the  abdomen showed gas and stool in the colon.  The patient was started on  IV antibiotics after admission.  Pulmonary was consulted for  bronchoscopy with path showing necrotizing granulomas.  No AFB  identified.  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  PTT was also  found to be elevated.  Dr. Cyndie Chime saw the patient and no specific  abnormalities were identified.  Lupus anticoagulant was ordered.  Fevers  had been decreasing.  Therapies were initiated.  The patient with  decreased endurance and problems with movement of his right lower  extremity, and the patient was limited by fatigue.  The patient was  placed on pureed diet per family request due to poor intake.  Foley  catheter was removed today.  Rehab evaluated the patient this morning  and felt he could benefit from an admission and thus he was  brought  today.   REVIEW OF SYSTEMS:  Notable for tightness, decreased coordination of  right upper extremity.  Poor appetite.  The patient denies any shortness  of breath.  Affect has been generally flat, although the patient denies  frank depression.  Other pertinent positives are above and full reviews  in the written H and P.   Past medical history is positive for:  1. 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  2. Right cerebellar hemorrhage in 2000.  3. BPH.  4. History of remote TB in 1970.   Family history is positive for CAD.   SOCIAL HISTORY:  The patient is married, lives alone in a 1-level house,  1 step to enter.  He does not drink or smoke.   ALLERGIES:  PENICILLIN.   HOME MEDICATIONS:  Altace, Toprol, Flonase, and Tylenol.   LABORATORY DATA:  Hemoglobin 8.5, white count 3.1, platelets 287.  Sodium 134, potassium 3.6, BUN 12, creatinine 0.69.   PHYSICAL EXAMINATION:  VITAL SIGNS:  Blood pressure is 113/59, pulse is  87, temperature  100.5, respiratory rate 18.  GENERAL:  The patient is lying in bed, generally flat, in no acute  distress.  HEENT:  Pupils equal, round, reactive to light and accommodation.  Ear,  nose and throat exam is fair with pink mucosa.  NECK:  Supple without JVD or lymphadenopathy.  CHEST:  Clear to auscultation bilaterally without wheezes, rales, or  rhonchi.  HEART:  Regular rate and rhythm without murmurs, rubs, or gallops.  EXTREMITIES:  No clubbing or cyanosis.  Only trace edema, right lower  extremity.  ABDOMEN:  Soft, nontender.  Bowel sounds are positive.  SKIN:  Generally intact throughout with an obvious signs of breakdown.  NEUROLOGICAL:  The patient has residual right facial droop and tongue  deviation.  He has mild sensory loss, right face.  Remainder of cranial  nerve exam unremarkable.  Right arm and leg display 1/2 sensation to  pinprick and light touch.  Strength is decreased at 3-4/5 right upper  and lower extremity today.   He has contracture of the right shoulder.  Reflexes are hyperactive at 3+ on the right.  Left upper extremity and  lower extremity's motor and sensory exam are normal.  Judgment,  orientation, memory are fairly intact, although there is language  barrier there.  Wife translates.  Affect is extremely flat.   POST-ADMISSION PHYSICIAN EVALUATION:  1. 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  2. The patient is admitted to receive collaborative interdisciplinary      care between the physiatrist, rehab nursing staff, and therapy      team.  3. The patient's level of medical complexity and substantial therapy      needs in context of that medical necessity cannot be provided at a      lesser intensity of care.  4. The patient has experienced substantial functional loss from his      baseline.  Upon functional assessment at the time of preadmission      screening this morning, the patient is min assist to supervision      with transfers, min assist ambulating 42 feet with quad cane.      Judging by the patient's diagnosis, physical exam, and functional      history, he has the potential for functional progress, which will      result in measurable gains while inpatient rehab.  These gains will      be of substantial and practical use upon discharge to home in      facilitating his mobility and self-care.  Interim changes in      medical status since preadmission screening are detailed above.  5. Physiatrist will provide 24-hour management of medical needs as      well as oversight of therapy plan/treatment and provide guidance as      appropriate regarding interaction of the two.  Medical problem list      and plan are listed below.  6. 24-hour rehab nursing will assist in management of the patient's      skin care needs as well as nutrition, bowel and bladder function,       safety awareness, and therapy concepts and techniques.  7. PT will assess and treat for lower extremity strength, tone      management, neuromuscular reeducation, strengthening, endurance,      and functional mobility with goals of  supervision.  8. OT will assess and treat for upper extremity use and ADLs, adaptive      equipment and techniques, neuromuscular facilitation, and family      education with goals of supervision to occasional min assist.  9. Case management and social worker will assess and treat for      psychosocial issues and discharge planning.  10.Team conferences will be held weekly to assess progress towards      goals and to determine barriers at discharge.  11.The patient has demonstrated sufficient medical stability and      exercise capacity to tolerate at least 3 hours of therapy per day      at least 5 days per week.  12.Estimated length of stay is 1 week to 10 days.  Prognosis fair to      good.   MEDICAL PROBLEM LIST AND PLAN:  1. Left thalamic bleed with right hemiparesis, right facial weakness.      The patient has fallen off from a functional standpoint apparently      over the last a year or two.  I am not sure if this is a mood-      related issue are not.  Apparently, he had been doing fairly well      until more recently.  It sounds as if there are some motivation      issues and perhaps some mood-related problems as well.  2. Hypertension:  We will monitor blood pressures.  He has had some      orthostasis at times.  We will follow BPs and use TEDs and      abdominal binder if needed.  3. Anorexia:  Encouraged p.o. intake and add protein supplementation.      We will follow I's and O's, weight, etc.  4. Tuberculosis:  We will continue the patient on isoniazid, Rifadin,      Myambutol, pyrazinamide per ID recommendations.  The patient is      still with low-grade temperature, although this is on the decline      as well as his white count.  We will  follow clinically and stress      as needed going forward.  The patient is now on droplet precautions      at this point.      Ranelle Oyster, M.D.  Electronically Signed     ZTS/MEDQ  D:  03/30/2009  T:  03/31/2009  Job:  [id number]

## 2012-06-08 DIAGNOSIS — H2510 Age-related nuclear cataract, unspecified eye: Secondary | ICD-10-CM | POA: Diagnosis not present

## 2012-08-27 DIAGNOSIS — N401 Enlarged prostate with lower urinary tract symptoms: Secondary | ICD-10-CM | POA: Diagnosis not present

## 2012-09-27 DIAGNOSIS — I6789 Other cerebrovascular disease: Secondary | ICD-10-CM | POA: Diagnosis not present

## 2012-09-27 DIAGNOSIS — J309 Allergic rhinitis, unspecified: Secondary | ICD-10-CM | POA: Diagnosis not present

## 2012-09-27 DIAGNOSIS — I10 Essential (primary) hypertension: Secondary | ICD-10-CM | POA: Diagnosis not present

## 2012-09-27 DIAGNOSIS — N400 Enlarged prostate without lower urinary tract symptoms: Secondary | ICD-10-CM | POA: Diagnosis not present

## 2012-09-27 DIAGNOSIS — R7309 Other abnormal glucose: Secondary | ICD-10-CM | POA: Diagnosis not present

## 2012-09-27 DIAGNOSIS — Z23 Encounter for immunization: Secondary | ICD-10-CM | POA: Diagnosis not present

## 2013-09-01 DIAGNOSIS — N401 Enlarged prostate with lower urinary tract symptoms: Secondary | ICD-10-CM | POA: Diagnosis not present

## 2013-09-02 DIAGNOSIS — I10 Essential (primary) hypertension: Secondary | ICD-10-CM | POA: Diagnosis not present

## 2013-09-02 DIAGNOSIS — Z23 Encounter for immunization: Secondary | ICD-10-CM | POA: Diagnosis not present

## 2013-09-02 DIAGNOSIS — I6789 Other cerebrovascular disease: Secondary | ICD-10-CM | POA: Diagnosis not present

## 2013-09-02 DIAGNOSIS — N400 Enlarged prostate without lower urinary tract symptoms: Secondary | ICD-10-CM | POA: Diagnosis not present

## 2013-09-02 DIAGNOSIS — J309 Allergic rhinitis, unspecified: Secondary | ICD-10-CM | POA: Diagnosis not present

## 2013-09-02 DIAGNOSIS — R7309 Other abnormal glucose: Secondary | ICD-10-CM | POA: Diagnosis not present

## 2013-12-13 DIAGNOSIS — I10 Essential (primary) hypertension: Secondary | ICD-10-CM | POA: Diagnosis not present

## 2013-12-13 DIAGNOSIS — E785 Hyperlipidemia, unspecified: Secondary | ICD-10-CM | POA: Diagnosis not present

## 2013-12-13 DIAGNOSIS — I6789 Other cerebrovascular disease: Secondary | ICD-10-CM | POA: Diagnosis not present

## 2013-12-13 DIAGNOSIS — J309 Allergic rhinitis, unspecified: Secondary | ICD-10-CM | POA: Diagnosis not present

## 2013-12-13 DIAGNOSIS — D649 Anemia, unspecified: Secondary | ICD-10-CM | POA: Diagnosis not present

## 2013-12-13 DIAGNOSIS — N400 Enlarged prostate without lower urinary tract symptoms: Secondary | ICD-10-CM | POA: Diagnosis not present

## 2013-12-13 DIAGNOSIS — R7309 Other abnormal glucose: Secondary | ICD-10-CM | POA: Diagnosis not present

## 2014-06-22 DIAGNOSIS — N400 Enlarged prostate without lower urinary tract symptoms: Secondary | ICD-10-CM | POA: Diagnosis not present

## 2014-06-22 DIAGNOSIS — R7309 Other abnormal glucose: Secondary | ICD-10-CM | POA: Diagnosis not present

## 2014-06-22 DIAGNOSIS — J309 Allergic rhinitis, unspecified: Secondary | ICD-10-CM | POA: Diagnosis not present

## 2014-06-22 DIAGNOSIS — D649 Anemia, unspecified: Secondary | ICD-10-CM | POA: Diagnosis not present

## 2014-06-22 DIAGNOSIS — E785 Hyperlipidemia, unspecified: Secondary | ICD-10-CM | POA: Diagnosis not present

## 2014-06-22 DIAGNOSIS — I10 Essential (primary) hypertension: Secondary | ICD-10-CM | POA: Diagnosis not present

## 2014-06-22 DIAGNOSIS — I6789 Other cerebrovascular disease: Secondary | ICD-10-CM | POA: Diagnosis not present

## 2014-08-30 DIAGNOSIS — Z23 Encounter for immunization: Secondary | ICD-10-CM | POA: Diagnosis not present

## 2014-12-29 ENCOUNTER — Encounter (HOSPITAL_COMMUNITY): Payer: Self-pay

## 2014-12-29 ENCOUNTER — Emergency Department (HOSPITAL_COMMUNITY)
Admission: EM | Admit: 2014-12-29 | Discharge: 2014-12-29 | Disposition: A | Discharge status: Home / Self Care | Payer: Medicare Other | Attending: Emergency Medicine | Admitting: Emergency Medicine

## 2014-12-29 DIAGNOSIS — K4091 Unilateral inguinal hernia, without obstruction or gangrene, recurrent: Secondary | ICD-10-CM

## 2014-12-29 DIAGNOSIS — Z880 Allergy status to penicillin: Secondary | ICD-10-CM | POA: Insufficient documentation

## 2014-12-29 DIAGNOSIS — R109 Unspecified abdominal pain: Secondary | ICD-10-CM | POA: Diagnosis present

## 2014-12-29 DIAGNOSIS — R10819 Abdominal tenderness, unspecified site: Secondary | ICD-10-CM | POA: Diagnosis not present

## 2014-12-29 DIAGNOSIS — K2970 Gastritis, unspecified, without bleeding: Secondary | ICD-10-CM | POA: Diagnosis not present

## 2014-12-29 DIAGNOSIS — Z8673 Personal history of transient ischemic attack (TIA), and cerebral infarction without residual deficits: Secondary | ICD-10-CM | POA: Diagnosis not present

## 2014-12-29 DIAGNOSIS — K469 Unspecified abdominal hernia without obstruction or gangrene: Secondary | ICD-10-CM | POA: Diagnosis not present

## 2014-12-29 HISTORY — DX: Cerebral infarction, unspecified: I63.9

## 2014-12-29 MED ORDER — HERNIA SUPPORT LEFT MEDIUM MISC: Status: DC

## 2014-12-29 NOTE — Progress Notes (Signed)
I spoke to Dr. Rubin PayorPickering, who stated this patient has a reducible inguinal hernia.  We have been asked to discuss with him and his son about possible repair in the near future.  After speaking to Rolm Galarik, the patient's son, he states his dad is on the fence as to whether he wants to proceed or not right now with repair.  I have given him my pager to call me and let me know his decision.  He is not on any blood thinners.  If he would like to proceed, we will call our office and get him set up for elective repair next week with Dr. Lindie SpruceWyatt.  Bessye Stith E 10:29 AM 12/29/2014

## 2014-12-29 NOTE — ED Provider Notes (Signed)
CSN: [id number]     Arrival date & time 12/29/14  [id number] History   First MD Initiated Contact with Patient 12/29/14 0930     Chief Complaint  Patient presents with  . Abdominal Pain     (Consider location/radiation/quality/duration/timing/severity/associated sxs/prior Treatment) Patient is a 81 y.o. male presenting with abdominal pain. The history is provided by the patient.  Abdominal Pain Associated symptoms: no chest pain, no chills, no fever, no nausea, no shortness of breath and no vomiting    patient presents with lower abdominal pain. Began this morning. Severe. Began after eating. Has a previous history of a known right inguinal hernia. He has had for 10 years. He saw Dr. Colin BentonEarle in the past and has talked to Dr. Lindie SpruceWyatt also. It is always out but does not tend to him much problem. He has had some pains over the last 6 months. No fevers. This morning it was a sharp pain that was severe and his mid to left lower abdomen. It has since resolved after a gram of Tylenol. No fevers. No vomiting.     Past Medical History  Diagnosis Date  . Stroke    History reviewed. No pertinent past surgical history. No family history on file. History  Substance Use Topics  . Smoking status: Never Smoker   . Smokeless tobacco: Never Used  . Alcohol Use: No    Review of Systems  Constitutional: Negative for fever and chills.  Respiratory: Negative for shortness of breath.   Cardiovascular: Negative for chest pain.  Gastrointestinal: Positive for abdominal pain. Negative for nausea and vomiting.  Genitourinary: Negative for flank pain.  Musculoskeletal: Negative for back pain.  Skin: Negative for wound.      Allergies  Penicillins  Home Medications   Prior to Admission medications   Not on File   BP 124/65 mmHg  Temp(Src) 97.4 F (36.3 C)  Resp 16  Ht 5\' 5"  (1.651 m)  Wt 120 lb (54.432 kg)  BMI 19.97 kg/m2  SpO2 99% Physical Exam  Constitutional: He appears well-developed.  HENT:   Head: Atraumatic.  Cardiovascular: Normal rate and regular rhythm.   Pulmonary/Chest: Effort normal.  Abdominal: There is no tenderness.  Large right inguinal hernia. Easily reducible and nontender but does immediately recur. No other abdominal tenderness.     ED Course  Procedures (including critical care time) Labs Review Labs Reviewed - No data to display  Imaging Review No results found.   EKG Interpretation None      MDM   Final diagnoses:  None    Patient with inguinal hernia. Reducible now but likely was incarcerated earlier. Have discussed with general surgery. They were going to see him in follow-up, but patient now states he does not want an operation. We'll give hernia truss and have follow-up as needed.    Juliet RudeNathan R. Rubin PayorPickering, MD 01/02/15 [id number]

## 2014-12-29 NOTE — ED Notes (Signed)
Offered patient fluids and food, not interested at this time.

## 2014-12-29 NOTE — Discharge Instructions (Signed)

## 2014-12-29 NOTE — ED Notes (Signed)
Pt. Developed lt. Lower quadrant pain began around 730 am.   Pt. Has a rt. Lower abdominal hernia.  Pt. Took 1000 mg Tylenol, pain has decreased.   Pt. Denies any n/v.  Pt.'s son reports that his father has been having intermittent pain in the last 6 months.

## 2015-02-09 ENCOUNTER — Inpatient Hospital Stay (HOSPITAL_COMMUNITY)
Admission: EM | Admit: 2015-02-09 | Discharge: 2015-02-13 | DRG: 480 | Disposition: A | Discharge status: Rehabilitation Facility | Payer: Medicare Other | Attending: Internal Medicine | Admitting: Internal Medicine

## 2015-02-09 ENCOUNTER — Encounter (HOSPITAL_COMMUNITY): Payer: Self-pay | Admitting: Emergency Medicine

## 2015-02-09 ENCOUNTER — Emergency Department (HOSPITAL_COMMUNITY): Payer: Medicare Other

## 2015-02-09 DIAGNOSIS — N3281 Overactive bladder: Secondary | ICD-10-CM | POA: Diagnosis present

## 2015-02-09 DIAGNOSIS — D62 Acute posthemorrhagic anemia: Secondary | ICD-10-CM | POA: Diagnosis not present

## 2015-02-09 DIAGNOSIS — N400 Enlarged prostate without lower urinary tract symptoms: Secondary | ICD-10-CM | POA: Diagnosis present

## 2015-02-09 DIAGNOSIS — S0990XA Unspecified injury of head, initial encounter: Secondary | ICD-10-CM | POA: Diagnosis not present

## 2015-02-09 DIAGNOSIS — A150 Tuberculosis of lung: Secondary | ICD-10-CM | POA: Diagnosis present

## 2015-02-09 DIAGNOSIS — Y95 Nosocomial condition: Secondary | ICD-10-CM | POA: Diagnosis not present

## 2015-02-09 DIAGNOSIS — Z79899 Other long term (current) drug therapy: Secondary | ICD-10-CM

## 2015-02-09 DIAGNOSIS — Z880 Allergy status to penicillin: Secondary | ICD-10-CM

## 2015-02-09 DIAGNOSIS — S72009A Fracture of unspecified part of neck of unspecified femur, initial encounter for closed fracture: Secondary | ICD-10-CM

## 2015-02-09 DIAGNOSIS — R531 Weakness: Secondary | ICD-10-CM

## 2015-02-09 DIAGNOSIS — I10 Essential (primary) hypertension: Secondary | ICD-10-CM | POA: Diagnosis not present

## 2015-02-09 DIAGNOSIS — R0902 Hypoxemia: Secondary | ICD-10-CM

## 2015-02-09 DIAGNOSIS — J189 Pneumonia, unspecified organism: Secondary | ICD-10-CM | POA: Diagnosis not present

## 2015-02-09 DIAGNOSIS — R1310 Dysphagia, unspecified: Secondary | ICD-10-CM | POA: Diagnosis present

## 2015-02-09 DIAGNOSIS — S72001A Fracture of unspecified part of neck of right femur, initial encounter for closed fracture: Secondary | ICD-10-CM | POA: Diagnosis not present

## 2015-02-09 DIAGNOSIS — M7989 Other specified soft tissue disorders: Secondary | ICD-10-CM | POA: Diagnosis not present

## 2015-02-09 DIAGNOSIS — S72011A Unspecified intracapsular fracture of right femur, initial encounter for closed fracture: Secondary | ICD-10-CM | POA: Diagnosis not present

## 2015-02-09 DIAGNOSIS — K5900 Constipation, unspecified: Secondary | ICD-10-CM | POA: Diagnosis present

## 2015-02-09 DIAGNOSIS — I69351 Hemiplegia and hemiparesis following cerebral infarction affecting right dominant side: Secondary | ICD-10-CM

## 2015-02-09 DIAGNOSIS — J9601 Acute respiratory failure with hypoxia: Secondary | ICD-10-CM

## 2015-02-09 DIAGNOSIS — D509 Iron deficiency anemia, unspecified: Secondary | ICD-10-CM

## 2015-02-09 DIAGNOSIS — D696 Thrombocytopenia, unspecified: Secondary | ICD-10-CM | POA: Diagnosis present

## 2015-02-09 DIAGNOSIS — S72011D Unspecified intracapsular fracture of right femur, subsequent encounter for closed fracture with routine healing: Secondary | ICD-10-CM | POA: Diagnosis not present

## 2015-02-09 DIAGNOSIS — M25571 Pain in right ankle and joints of right foot: Secondary | ICD-10-CM | POA: Diagnosis not present

## 2015-02-09 DIAGNOSIS — S72142D Displaced intertrochanteric fracture of left femur, subsequent encounter for closed fracture with routine healing: Secondary | ICD-10-CM

## 2015-02-09 DIAGNOSIS — A419 Sepsis, unspecified organism: Secondary | ICD-10-CM | POA: Diagnosis not present

## 2015-02-09 DIAGNOSIS — R630 Anorexia: Secondary | ICD-10-CM

## 2015-02-09 DIAGNOSIS — S72001S Fracture of unspecified part of neck of right femur, sequela: Secondary | ICD-10-CM | POA: Diagnosis not present

## 2015-02-09 DIAGNOSIS — S72001D Fracture of unspecified part of neck of right femur, subsequent encounter for closed fracture with routine healing: Secondary | ICD-10-CM

## 2015-02-09 DIAGNOSIS — S72001P Fracture of unspecified part of neck of right femur, subsequent encounter for closed fracture with malunion: Secondary | ICD-10-CM | POA: Diagnosis not present

## 2015-02-09 DIAGNOSIS — N318 Other neuromuscular dysfunction of bladder: Secondary | ICD-10-CM | POA: Diagnosis present

## 2015-02-09 DIAGNOSIS — S72001G Fracture of unspecified part of neck of right femur, subsequent encounter for closed fracture with delayed healing: Secondary | ICD-10-CM | POA: Diagnosis not present

## 2015-02-09 DIAGNOSIS — Z791 Long term (current) use of non-steroidal anti-inflammatories (NSAID): Secondary | ICD-10-CM | POA: Diagnosis not present

## 2015-02-09 DIAGNOSIS — M79671 Pain in right foot: Secondary | ICD-10-CM | POA: Diagnosis not present

## 2015-02-09 DIAGNOSIS — K4090 Unilateral inguinal hernia, without obstruction or gangrene, not specified as recurrent: Secondary | ICD-10-CM | POA: Diagnosis present

## 2015-02-09 DIAGNOSIS — T148 Other injury of unspecified body region: Secondary | ICD-10-CM | POA: Diagnosis not present

## 2015-02-09 DIAGNOSIS — S99911A Unspecified injury of right ankle, initial encounter: Secondary | ICD-10-CM | POA: Diagnosis not present

## 2015-02-09 DIAGNOSIS — I638 Other cerebral infarction: Secondary | ICD-10-CM

## 2015-02-09 DIAGNOSIS — M25551 Pain in right hip: Secondary | ICD-10-CM | POA: Diagnosis not present

## 2015-02-09 DIAGNOSIS — I69851 Hemiplegia and hemiparesis following other cerebrovascular disease affecting right dominant side: Secondary | ICD-10-CM | POA: Diagnosis not present

## 2015-02-09 DIAGNOSIS — M79606 Pain in leg, unspecified: Secondary | ICD-10-CM | POA: Diagnosis not present

## 2015-02-09 DIAGNOSIS — G8110 Spastic hemiplegia affecting unspecified side: Secondary | ICD-10-CM | POA: Diagnosis not present

## 2015-02-09 DIAGNOSIS — M7501 Adhesive capsulitis of right shoulder: Secondary | ICD-10-CM | POA: Diagnosis not present

## 2015-02-09 DIAGNOSIS — S79911A Unspecified injury of right hip, initial encounter: Secondary | ICD-10-CM | POA: Diagnosis not present

## 2015-02-09 DIAGNOSIS — I639 Cerebral infarction, unspecified: Secondary | ICD-10-CM | POA: Diagnosis not present

## 2015-02-09 DIAGNOSIS — I6389 Other cerebral infarction: Secondary | ICD-10-CM | POA: Insufficient documentation

## 2015-02-09 DIAGNOSIS — R079 Chest pain, unspecified: Secondary | ICD-10-CM | POA: Diagnosis not present

## 2015-02-09 DIAGNOSIS — R52 Pain, unspecified: Secondary | ICD-10-CM

## 2015-02-09 DIAGNOSIS — Z419 Encounter for procedure for purposes other than remedying health state, unspecified: Secondary | ICD-10-CM

## 2015-02-09 DIAGNOSIS — S99921A Unspecified injury of right foot, initial encounter: Secondary | ICD-10-CM | POA: Diagnosis not present

## 2015-02-09 DIAGNOSIS — G8111 Spastic hemiplegia affecting right dominant side: Secondary | ICD-10-CM

## 2015-02-09 DIAGNOSIS — W19XXXA Unspecified fall, initial encounter: Secondary | ICD-10-CM | POA: Diagnosis not present

## 2015-02-09 DIAGNOSIS — S79921A Unspecified injury of right thigh, initial encounter: Secondary | ICD-10-CM | POA: Diagnosis not present

## 2015-02-09 HISTORY — DX: Benign prostatic hyperplasia without lower urinary tract symptoms: N40.0

## 2015-02-09 HISTORY — DX: Unilateral inguinal hernia, without obstruction or gangrene, not specified as recurrent: K40.90

## 2015-02-09 HISTORY — DX: Essential (primary) hypertension: I10

## 2015-02-09 LAB — CBC WITH DIFFERENTIAL/PLATELET
Basophils Absolute: 0 10*3/uL (ref 0.0–0.1)
Basophils Relative: 0 % (ref 0–1)
Eosinophils Absolute: 0.2 10*3/uL (ref 0.0–0.7)
Eosinophils Relative: 2 % (ref 0–5)
HEMATOCRIT: 38.1 % — AB (ref 39.0–52.0)
Hemoglobin: 11.9 g/dL — ABNORMAL LOW (ref 13.0–17.0)
Lymphocytes Relative: 8 % — ABNORMAL LOW (ref 12–46)
Lymphs Abs: 0.8 10*3/uL (ref 0.7–4.0)
MCH: 23.1 pg — AB (ref 26.0–34.0)
MCHC: 31.2 g/dL (ref 30.0–36.0)
MCV: 74 fL — AB (ref 78.0–100.0)
MONO ABS: 0.8 10*3/uL (ref 0.1–1.0)
Monocytes Relative: 9 % (ref 3–12)
Neutro Abs: 7.6 10*3/uL (ref 1.7–7.7)
Neutrophils Relative %: 81 % — ABNORMAL HIGH (ref 43–77)
PLATELETS: 127 10*3/uL — AB (ref 150–400)
RBC: 5.15 MIL/uL (ref 4.22–5.81)
RDW: 13.4 % (ref 11.5–15.5)
WBC: 9.4 10*3/uL (ref 4.0–10.5)

## 2015-02-09 LAB — URINALYSIS, ROUTINE W REFLEX MICROSCOPIC
BILIRUBIN URINE: NEGATIVE
Glucose, UA: NEGATIVE mg/dL
KETONES UR: NEGATIVE mg/dL
LEUKOCYTES UA: NEGATIVE
NITRITE: NEGATIVE
PROTEIN: NEGATIVE mg/dL
Specific Gravity, Urine: 1.019 (ref 1.005–1.030)
UROBILINOGEN UA: 0.2 mg/dL (ref 0.0–1.0)
pH: 6.5 (ref 5.0–8.0)

## 2015-02-09 LAB — HEPATIC FUNCTION PANEL
ALT: 14 U/L (ref 0–53)
AST: 23 U/L (ref 0–37)
Albumin: 3.8 g/dL (ref 3.5–5.2)
Alkaline Phosphatase: 43 U/L (ref 39–117)
TOTAL PROTEIN: 7.2 g/dL (ref 6.0–8.3)
Total Bilirubin: 0.6 mg/dL (ref 0.3–1.2)

## 2015-02-09 LAB — BASIC METABOLIC PANEL
ANION GAP: 9 (ref 5–15)
BUN: 26 mg/dL — ABNORMAL HIGH (ref 6–23)
CALCIUM: 8.9 mg/dL (ref 8.4–10.5)
CO2: 26 mmol/L (ref 19–32)
CREATININE: 0.83 mg/dL (ref 0.50–1.35)
Chloride: 103 mmol/L (ref 96–112)
GFR calc Af Amer: >90 mL/min (ref >90)
GFR calc non Af Amer: 82 mL/min — ABNORMAL LOW (ref >90)
Glucose, Bld: 124 mg/dL — ABNORMAL HIGH (ref 70–99)
Potassium: 4.6 mmol/L (ref 3.5–5.1)
Sodium: 138 mmol/L (ref 135–145)

## 2015-02-09 LAB — TROPONIN I: Troponin I: <0.03 ng/mL (ref <0.031)

## 2015-02-09 LAB — URINE MICROSCOPIC-ADD ON

## 2015-02-09 MED ORDER — ZOLPIDEM TARTRATE 5 MG PO TABS: 5.0000 mg | ORAL_TABLET | Freq: Every evening | ORAL | Status: DC | PRN

## 2015-02-09 MED ORDER — ONDANSETRON HCL 4 MG/2ML IJ SOLN: 4.0000 mg | Freq: Three times a day (TID) | INTRAMUSCULAR | Status: AC | PRN

## 2015-02-09 MED ORDER — MORPHINE SULFATE 2 MG/ML IJ SOLN
1.0000 mg | INTRAMUSCULAR | Status: DC | PRN
  Administered 2015-02-09: 2 mg via INTRAVENOUS
  Administered 2015-02-10: 1 mg via INTRAVENOUS
  Administered 2015-02-10: 2 mg via INTRAVENOUS
  Filled 2015-02-09 (×4): qty 1

## 2015-02-09 MED ORDER — OXYCODONE HCL 5 MG PO TABS
5.0000 mg | ORAL_TABLET | ORAL | Status: DC | PRN
  Administered 2015-02-09 – 2015-02-13 (×6): 5 mg via ORAL
  Filled 2015-02-09 (×8): qty 1

## 2015-02-09 MED ORDER — BISACODYL 5 MG PO TBEC
5.0000 mg | DELAYED_RELEASE_TABLET | Freq: Every day | ORAL | Status: DC | PRN
Start: 2015-02-09 — End: 2015-02-11
  Administered 2015-02-11: 5 mg via ORAL
  Filled 2015-02-09: qty 1

## 2015-02-09 MED ORDER — METOPROLOL SUCCINATE ER 50 MG PO TB24
50.0000 mg | ORAL_TABLET | Freq: Every day | ORAL | Status: DC
  Administered 2015-02-10: 50 mg via ORAL
  Filled 2015-02-09: qty 1

## 2015-02-09 MED ORDER — MAGNESIUM CITRATE PO SOLN: 1.0000 | Freq: Once | ORAL | Status: AC | PRN

## 2015-02-09 MED ORDER — SODIUM CHLORIDE 0.9 % IV BOLUS (SEPSIS)
500.0000 mL | Freq: Once | INTRAVENOUS | Status: AC
  Administered 2015-02-09: 500 mL via INTRAVENOUS

## 2015-02-09 MED ORDER — DUTASTERIDE-TAMSULOSIN HCL 0.5-0.4 MG PO CAPS: 1.0000 | ORAL_CAPSULE | Freq: Every day | ORAL | Status: DC

## 2015-02-09 MED ORDER — DOCUSATE SODIUM 100 MG PO CAPS
100.0000 mg | ORAL_CAPSULE | Freq: Two times a day (BID) | ORAL | Status: DC
  Administered 2015-02-10 – 2015-02-13 (×7): 100 mg via ORAL
  Filled 2015-02-09 (×7): qty 1

## 2015-02-09 MED ORDER — MORPHINE SULFATE 4 MG/ML IJ SOLN
4.0000 mg | Freq: Once | INTRAMUSCULAR | Status: AC
  Administered 2015-02-09: 4 mg via INTRAVENOUS
  Filled 2015-02-09: qty 1

## 2015-02-09 MED ORDER — SODIUM CHLORIDE 0.9 % IV SOLN
INTRAVENOUS | Status: DC
  Administered 2015-02-10 (×2): via INTRAVENOUS

## 2015-02-09 MED ORDER — DUTASTERIDE 0.5 MG PO CAPS
0.5000 mg | ORAL_CAPSULE | Freq: Every day | ORAL | Status: DC
  Administered 2015-02-11 – 2015-02-13 (×3): 0.5 mg via ORAL
  Filled 2015-02-09 (×4): qty 1

## 2015-02-09 MED ORDER — ACETAMINOPHEN 325 MG PO TABS
650.0000 mg | ORAL_TABLET | Freq: Four times a day (QID) | ORAL | Status: DC | PRN
  Administered 2015-02-10: 650 mg via ORAL
  Filled 2015-02-09 (×2): qty 2

## 2015-02-09 MED ORDER — SODIUM CHLORIDE 0.9 % IV SOLN
INTRAVENOUS | Status: AC
  Administered 2015-02-09: 22:00:00 via INTRAVENOUS

## 2015-02-09 MED ORDER — ACETAMINOPHEN 650 MG RE SUPP: 650.0000 mg | Freq: Four times a day (QID) | RECTAL | Status: DC | PRN

## 2015-02-09 MED ORDER — ASPIRIN EC 81 MG PO TBEC: 81.0000 mg | DELAYED_RELEASE_TABLET | Freq: Every day | ORAL | Status: DC

## 2015-02-09 MED ORDER — TAMSULOSIN HCL 0.4 MG PO CAPS
0.4000 mg | ORAL_CAPSULE | Freq: Every day | ORAL | Status: DC
  Administered 2015-02-11 – 2015-02-13 (×3): 0.4 mg via ORAL
  Filled 2015-02-09 (×2): qty 1

## 2015-02-09 NOTE — ED Notes (Signed)
Pt from home via [facility] with c/o fall x 2 days ago via sliding down the bathroom wall to a sitting position.  Pt reports right hip pain and has been unable to walk since.  Hx of CVA with right sided weakness and numbness deficits present.  Pt in NAD, A&O.

## 2015-02-09 NOTE — ED Provider Notes (Signed)
CSN: [id number]     Arrival date & time 02/09/15  1738 History   First MD Initiated Contact with Patient 02/09/15 1743     Chief Complaint  Patient presents with  . Fall  . Hip Pain     (Consider location/radiation/quality/duration/timing/severity/associated sxs/prior Treatment) HPI Comments: 81 year old male with history of stroke with right hemiparesis, high blood pressure, walker use at baseline presents with worsening general weakness the past 2-3 days and fall 2 days ago with left hip injury. Patient has no hip fracture history. General weakness and needing increased assistance and unable to bear weight recently. Nothing improves his symptoms, family with patient at bedside. Pain with any range of motion of the hip patient keeps flexed on the right. No blood thinners due to hemorrhagic stroke in the past. No head injury, low risk fall from sliding off 2 feet landing on hip, no neck pain or headache.  Patient is a 81 y.o. male presenting with fall and hip pain. The history is provided by the patient and a relative.  Fall Pertinent negatives include no chest pain, no abdominal pain, no headaches and no shortness of breath.  Hip Pain Pertinent negatives include no chest pain, no abdominal pain, no headaches and no shortness of breath.    Past Medical History  Diagnosis Date  . Stroke   . Hypertension   . Enlarged prostate   . Inguinal hernia    Past Surgical History  Procedure Laterality Date  . Brain surgery     History reviewed. No pertinent family history. History  Substance Use Topics  . Smoking status: Never Smoker   . Smokeless tobacco: Never Used  . Alcohol Use: No    Review of Systems  Constitutional: Positive for fatigue. Negative for fever and chills.  HENT: Negative for congestion.   Eyes: Negative for visual disturbance.  Respiratory: Negative for shortness of breath.   Cardiovascular: Negative for chest pain.  Gastrointestinal: Negative for vomiting and  abdominal pain.  Genitourinary: Negative for dysuria and flank pain.  Musculoskeletal: Positive for arthralgias. Negative for back pain, neck pain and neck stiffness.  Skin: Positive for wound. Negative for rash.  Neurological: Positive for weakness. Negative for light-headedness and headaches.      Allergies  Penicillins  Home Medications   Prior to Admission medications   Medication Sig Start Date End Date Taking? Authorizing Provider  Dutasteride-Tamsulosin HCl 0.5-0.4 MG CAPS Take 1 capsule by mouth daily.   Yes Historical Provider, MD  ibuprofen (ADVIL,MOTRIN) 200 MG tablet Take 400 mg by mouth every 6 (six) hours as needed for moderate pain.   Yes Historical Provider, MD  metoprolol succinate (TOPROL-XL) 50 MG 24 hr tablet Take 50 mg by mouth daily. Take with or immediately following a meal.   Yes Historical Provider, MD  Elastic Bandages & Supports (HERNIA SUPPORT LEFT MEDIUM) MISC Hernia truss. Wear as needed for support. May select variety 12/29/14   Benjiman Core, MD   BP 138/81 mmHg  Pulse 79  Temp(Src) 98 F (36.7 C) (Oral)  Resp 17  SpO2 97% Physical Exam  Constitutional: He is oriented to person, place, and time. He appears well-developed. No distress (fatigue appearance).  HENT:  Head: Normocephalic and atraumatic.  Dry mucous membranes  Eyes: Right eye exhibits no discharge. Left eye exhibits no discharge.  Neck: Normal range of motion. Neck supple. No tracheal deviation present.  Cardiovascular: Normal rate and regular rhythm.   Pulmonary/Chest: Effort normal. Respiratory distress: decreased effort.  Abdominal: Soft.  He exhibits no distension. There is no tenderness. There is no guarding.  Musculoskeletal: He exhibits edema (mild lower extremity is bilateral) and tenderness.  Patient has right knee flexed and minimal movement of the right hip due to pain also patient has stroke history on the right however this is new positioning for the patient. Unable to do  good assessment due to pain of the right femur in the right hip, neurovascularly intact distal. No pain in the left hip or left knee for range motion and 5+ strength in left lower extremity.  Neurological: He is alert and oriented to person, place, and time. GCS eye subscore is 4. GCS verbal subscore is 5. GCS motor subscore is 6.  Cranial nerves intact, neck supple no midline vertebral tenderness full range of motion, gross sensation intact, normal strength in the left side, weakness on the right which is chronic.  Skin: Skin is warm. No rash noted.  Psychiatric: He has a normal mood and affect.  Nursing note and vitals reviewed.   ED Course  Procedures (including critical care time) Labs Review Labs Reviewed  BASIC METABOLIC PANEL - Abnormal; Notable for the following:    Glucose, Bld 124 (*)    BUN 26 (*)    GFR calc non Af Amer 82 (*)    All other components within normal limits  CBC WITH DIFFERENTIAL/PLATELET - Abnormal; Notable for the following:    Hemoglobin 11.9 (*)    HCT 38.1 (*)    MCV 74.0 (*)    MCH 23.1 (*)    Platelets 127 (*)    Neutrophils Relative % 81 (*)    Lymphocytes Relative 8 (*)    All other components within normal limits  URINALYSIS, ROUTINE W REFLEX MICROSCOPIC - Abnormal; Notable for the following:    Hgb urine dipstick TRACE (*)    All other components within normal limits  URINE CULTURE  TROPONIN I  URINE MICROSCOPIC-ADD ON  HEPATIC FUNCTION PANEL    Imaging Review Dg Chest 1 View  02/09/2015   CLINICAL DATA:  Fall 3 days ago with chest pain, initial encounter  EXAM: CHEST  1 VIEW  COMPARISON:  12/12/2009  FINDINGS: Cardiac shadow is within normal limits. The thoracic aorta is tortuous and mildly calcified. Lungs are clear. No pneumothorax is noted. No acute bony abnormality is seen.  IMPRESSION: No acute abnormality noted.   Electronically Signed   By: Alcide Clever M.D.   On: 02/09/2015 19:00   Ct Head Wo Contrast  02/09/2015   CLINICAL DATA:   Fall 2 days ago.  CVA with right-sided weakness.  EXAM: CT HEAD WITHOUT CONTRAST  TECHNIQUE: Contiguous axial images were obtained from the base of the skull through the vertex without intravenous contrast.  COMPARISON:  03/22/2009  FINDINGS: Sinuses/Soft tissues: Mucosal thickening of left maxillary sinus. Clear mastoid air cells. Occipital craniotomy.  Intracranial: Generalized atrophy. Moderate low density in the periventricular white matter likely related to small vessel disease. Slightly increased. Cerebellar atrophy with encephalomalacia in the medial right cerebellar hemisphere, as before. Dolichoectasia of the right vertebral artery. No mass lesion, hemorrhage, hydrocephalus, acute infarct, intra-axial, or extra-axial fluid collection.  IMPRESSION: 1.  No acute intracranial abnormality. 2. Cerebral/cerebellar atrophy with progressive small vessel ischemic change. 3. Sinus disease and prior craniotomy.   Electronically Signed   By: Jeronimo Greaves M.D.   On: 02/09/2015 18:37   Dg Hip Unilat With Pelvis Min 4 Views Right  02/09/2015   CLINICAL DATA:  Fall  3 days ago with right hip pain, initial encounter  EXAM: RIGHT HIP (WITH PELVIS) 4+ VIEWS  COMPARISON:  None.  FINDINGS: Pelvic ring is intact. The proximal left femur is within normal limits. Only limited evaluation of the proximal right femur is available due to the patient's inability to adequately position himself. There is some regularity of the femoral neck superimposed over the femoral head. This is suspicious for subcapital femoral neck fracture. CT may be helpful for further evaluation.  IMPRESSION: Changes suspicious for a subcapital femoral neck fracture. CT may be helpful for further evaluation given difficulty positioning the patient.   Electronically Signed   By: Alcide CleverMark  Lukens M.D.   On: 02/09/2015 19:11   Dg Femur, Min 2 Views Right  02/09/2015   CLINICAL DATA:  Fall 3 days ago with right hip and leg pain  EXAM: RIGHT FEMUR 2 VIEWS  COMPARISON:   None.  FINDINGS: Irregularity of the proximal femur is again identified suspicious for subcapital femoral neck fracture. The distal femur is within normal limits. The knee joint is unremarkable.  IMPRESSION: Changes suspicious for proximal right femoral fracture   Electronically Signed   By: Alcide CleverMark  Lukens M.D.   On: 02/09/2015 19:12     EKG Interpretation None      MDM   Final diagnoses:  General weakness  Closed right hip fracture, initial encounter   Patient presents with general weakness and recent fall and right hip pain. Plan for metabolic, cardiac workup, x-rays of the right hip and femur and CT head with hemorrhagic history general weakness and decreased ability to ambulate last 2 days. Plan for admission  Reviewed x-ray concern for femoral neck fracture, discussed with orthopedic surgeon on call plan for surgery tomorrow. Orthopedic surgery and discussed with family in the phone. Updated family with plan and discussed with [facility] hospitalist. The patients results and plan were reviewed and discussed.   Any x-rays performed were personally reviewed by myself.   Differential diagnosis were considered with the presenting HPI.  Medications  0.9 %  sodium chloride infusion (not administered)  ondansetron (ZOFRAN) injection 4 mg (not administered)  sodium chloride 0.9 % bolus 500 mL (500 mLs Intravenous New Bag/Given 02/09/15 1811)  morphine 4 MG/ML injection 4 mg (4 mg Intravenous Given 02/09/15 2042)    Filed Vitals:   02/09/15 1753 02/09/15 1754 02/09/15 1800 02/09/15 2000  BP: 133/74  126/75 138/81  Pulse:  76 76 79  Temp:      TempSrc:      Resp: 20 15 15 17   SpO2:  94% 93% 97%    Final diagnoses:  General weakness  Closed right hip fracture, initial encounter    Admission/ observation were discussed with the admitting physician, patient and/or family and they are comfortable with the plan.    Blane OharaJoshua Basil Blakesley, MD 02/09/15 2106

## 2015-02-09 NOTE — H&P (Signed)
[facility] History and Physical  [name] LagosJimmy L [name] [id number] DOB: 02/26/1934 DOA: 02/09/2015  Referring physician:  PCP: Lina Sayreimothy Lane, MD  Specialists:  Chief Complaint: Right Hip fracture  HPI: [name] LagosJimmy L Koffman is a 81 y.o. AM PMHx stroke 2 (2000, 2004) with right hemiparesis, HTN, inguinal hernia, uses walker at baseline. 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 Patient has no hip fracture history. General weakness and needing increased assistance and unable to bear weight recently. Nothing improves his symptoms, family with patient at bedside. Pain with any range of motion of the hip patient keeps flexed on the right. No blood thinners due to hemorrhagic stroke in the past. No head injury, low risk fall from sliding off 2 feet landing on hip, no neck pain or headache. Studies was sitting on the commode and went to sit gallop on a small chair next to the bathtub and missed the chair. Patient has done this in the past with only minor pain. Felt that this was the same situation so did not pursue additional care on Wednesday when incident occurred. However pain did not improve so proceed to ED for further evaluation today.  Review of Systems: The patient denies anorexia, fever, weight loss,, vision loss, decreased hearing, hoarseness, chest pain, syncope, dyspnea on exertion, peripheral edema, balance deficits, hemoptysis, abdominal pain, melena, hematochezia, severe indigestion/heartburn, hematuria, incontinence, genital sores, muscle weakness, suspicious skin lesions, transient blindness, difficulty walking, depression, unusual weight change, abnormal bleeding, enlarged lymph nodes, angioedema, and breast masses.    TRAVEL HISTORY: NA   Consultants:  Dr.Timothy D Murphy (orthopedic surgery)    Procedure/Significant Events:  4/1 CT head without contrast;. No acute intracranial abnormality.-  Cerebral/cerebellar atrophy with progressive small vessel ischemic change. 4/1 right hip and pelvis x-ray; Changes suspicious for a subcapital right femoral neck fracture.  4/1 CT hip right without contrast; Subcapital right femoral neck fracture, impacted/foreshortened   Culture     Antibiotics:     DVT prophylaxis:  SCD; left leg only  Devices     LINES / TUBES:     Past Medical History  Diagnosis Date  . Stroke   . Hypertension   . Enlarged prostate   . Inguinal hernia    Past Surgical History  Procedure Laterality Date  . Brain surgery     Social History:  reports that he has never smoked. He has never used smokeless tobacco. He reports that he does not drink alcohol or use illicit drugs. where does patient live; home with wife. Can patient participate in ADLs?  Allergies  Allergen Reactions  . Penicillins Hives    History reviewed. No pertinent family history.     Prior to Admission medications   Medication Sig Start Date End Date Taking? Authorizing Provider  Dutasteride-Tamsulosin HCl 0.5-0.4 MG CAPS Take 1 capsule by mouth daily.    Historical Provider, MD  Elastic Bandages & Supports (HERNIA SUPPORT LEFT MEDIUM) MISC Hernia truss. Wear as needed for support. May select variety 12/29/14   Benjiman CoreNathan Pickering, MD  metoprolol succinate (TOPROL-XL) 50 MG 24 hr tablet Take 50 mg by mouth daily. Take with or immediately following a meal.    Historical Provider, MD   Physical Exam: Filed Vitals:   02/09/15 1800 02/09/15 2000 02/09/15 2200 02/09/15 2243  BP: 126/75 138/81 135/76 140/82  Pulse: 76 79 72 79  Temp:    98.9 F (37.2 C)  TempSrc:  Resp: SpO2: 93% 97% 98% 94%     General: A/O 4, NAD (just received dose of morphine)  Neck: Negative JVD, negative lymphadenopathy  Cardiovascular: Regular rhythm and rate, negative murmurs rubs or gallops, normal S1/S2  Respiratory: Clear to auscultation bilateral  Abdomen: Soft,  nontender, nondistended, plus bowel sounds  Musculoskeletal: Right leg flexed, deformity palpated at proximal neck of femur, DP/PT pulse 2+. Left lower extremity within normal limit.    Labs on Admission:  Basic Metabolic Panel:  Recent Labs Lab 02/09/15 1807  NA 138  K 4.6  CL 103  CO2 26  GLUCOSE 124*  BUN 26*  CREATININE 0.83  CALCIUM 8.9   Liver Function Tests:  Recent Labs Lab 02/09/15 1900  AST 23  ALT 14  ALKPHOS 43  BILITOT 0.6  PROT 7.2  ALBUMIN 3.8   No results for input(s): LIPASE, AMYLASE in the last 168 hours. No results for input(s): AMMONIA in the last 168 hours. CBC:  Recent Labs Lab 02/09/15 1807  WBC 9.4  NEUTROABS 7.6  HGB 11.9*  HCT 38.1*  MCV 74.0*  PLT 127*   Cardiac Enzymes:  Recent Labs Lab 02/09/15 1807  TROPONINI <0.03    BNP (last 3 results) No results for input(s): BNP in the last 8760 hours.  ProBNP (last 3 results) No results for input(s): PROBNP in the last 8760 hours.  CBG: No results for input(s): GLUCAP in the last 168 hours.  Radiological Exams on Admission: Dg Chest 1 View  02/09/2015   CLINICAL DATA:  Fall 3 days ago with chest pain, initial encounter  EXAM: CHEST  1 VIEW  COMPARISON:  12/12/2009  FINDINGS: Cardiac shadow is within normal limits. The thoracic aorta is tortuous and mildly calcified. Lungs are clear. No pneumothorax is noted. No acute bony abnormality is seen.  IMPRESSION: No acute abnormality noted.   Electronically Signed   By: Alcide Clever M.D.   On: 02/09/2015 19:00   Ct Head Wo Contrast  02/09/2015   CLINICAL DATA:  Fall 2 days ago.  CVA with right-sided weakness.  EXAM: CT HEAD WITHOUT CONTRAST  TECHNIQUE: Contiguous axial images were obtained from the base of the skull through the vertex without intravenous contrast.  COMPARISON:  03/22/2009  FINDINGS: Sinuses/Soft tissues: Mucosal thickening of left maxillary sinus. Clear mastoid air cells. Occipital craniotomy.  Intracranial: Generalized  atrophy. Moderate low density in the periventricular white matter likely related to small vessel disease. Slightly increased. Cerebellar atrophy with encephalomalacia in the medial right cerebellar hemisphere, as before. Dolichoectasia of the right vertebral artery. No mass lesion, hemorrhage, hydrocephalus, acute infarct, intra-axial, or extra-axial fluid collection.  IMPRESSION: 1.  No acute intracranial abnormality. 2. Cerebral/cerebellar atrophy with progressive small vessel ischemic change. 3. Sinus disease and prior craniotomy.   Electronically Signed   By: Jeronimo Greaves M.D.   On: 02/09/2015 18:37   Ct Hip Right Wo Contrast  02/09/2015   CLINICAL DATA:  Fall, right hip pain  EXAM: CT OF THE RIGHT HIP WITHOUT CONTRAST  TECHNIQUE: Multidetector CT imaging of the right hip was performed according to the standard protocol. Multiplanar CT image reconstructions were also generated.  COMPARISON:  Right hip radiographs dated 02/09/2015  FINDINGS: Subcapital right femoral neck fracture (series 6/image 15), impacted/foreshortened (series 6/image 20).  Visualized bony pelvis appears intact.  Right inguinal/scrotal hernia containing fat and small bowel (series 3/ image 13), incompletely visualized.  Foley catheter in the bladder.  Moderate stool in the  rectum.  IMPRESSION: Subcapital right femoral neck fracture, impacted/foreshortened.   Electronically Signed   By: Charline Bills M.D.   On: 02/09/2015 22:03   Dg Hip Unilat With Pelvis Min 4 Views Right  02/09/2015   CLINICAL DATA:  Fall 3 days ago with right hip pain, initial encounter  EXAM: RIGHT HIP (WITH PELVIS) 4+ VIEWS  COMPARISON:  None.  FINDINGS: Pelvic ring is intact. The proximal left femur is within normal limits. Only limited evaluation of the proximal right femur is available due to the patient's inability to adequately position himself. There is some regularity of the femoral neck superimposed over the femoral head. This is suspicious for subcapital  femoral neck fracture. CT may be helpful for further evaluation.  IMPRESSION: Changes suspicious for a subcapital femoral neck fracture. CT may be helpful for further evaluation given difficulty positioning the patient.   Electronically Signed   By: Alcide Clever M.D.   On: 02/09/2015 19:11   Dg Femur, Min 2 Views Right  02/09/2015   CLINICAL DATA:  Fall 3 days ago with right hip and leg pain  EXAM: RIGHT FEMUR 2 VIEWS  COMPARISON:  None.  FINDINGS: Irregularity of the proximal femur is again identified suspicious for subcapital femoral neck fracture. The distal femur is within normal limits. The knee joint is unremarkable.  IMPRESSION: Changes suspicious for proximal right femoral fracture   Electronically Signed   By: Alcide Clever M.D.   On: 02/09/2015 19:12    EKG: Pending   Assessment/Plan Principal Problem:   Fracture of femoral neck, right Active Problems:   Tuberculosis of lung, infiltrative   Essential hypertension   Hemiparesis affecting right side as late effect of cerebrovascular accident   OVERACTIVE BLADDER   Anorexia   Inguinal hernia   Closed right hip fracture   Fractured hip  Right hip fracture -Surgery in the a.m. -Pain control per [facility]  Essential hypertension -Metoprolol XL 50 mg daily  CVA with residual right hemiparesis -PT/OT post surgery  Anorexia -Nutrition consult post surgery     Code Status: Full  Family Communication: Wife present Disposition Plan: Per surgery  Time spent: Extremity minutes  Drema Dallas [facility] Pager [phone]  If 7PM-7AM, please contact night-coverage [email or web address] Password [facility] 02/10/2015, 3:28 AM

## 2015-02-10 ENCOUNTER — Encounter (HOSPITAL_COMMUNITY)
Admission: EM | Disposition: A | Discharge status: Rehabilitation Facility | Payer: Self-pay | Source: Home / Self Care | Attending: Internal Medicine

## 2015-02-10 ENCOUNTER — Inpatient Hospital Stay (HOSPITAL_COMMUNITY): Payer: Medicare Other | Admitting: Anesthesiology

## 2015-02-10 ENCOUNTER — Inpatient Hospital Stay (HOSPITAL_COMMUNITY): Payer: Medicare Other

## 2015-02-10 ENCOUNTER — Encounter (HOSPITAL_COMMUNITY): Payer: Self-pay | Admitting: Certified Registered Nurse Anesthetist

## 2015-02-10 DIAGNOSIS — D509 Iron deficiency anemia, unspecified: Secondary | ICD-10-CM

## 2015-02-10 DIAGNOSIS — D696 Thrombocytopenia, unspecified: Secondary | ICD-10-CM

## 2015-02-10 DIAGNOSIS — I6389 Other cerebral infarction: Secondary | ICD-10-CM | POA: Insufficient documentation

## 2015-02-10 HISTORY — PX: HIP PINNING,CANNULATED: SHX1758

## 2015-02-10 LAB — TYPE AND SCREEN
ABO/RH(D): B POS
Antibody Screen: NEGATIVE

## 2015-02-10 LAB — PROCALCITONIN: Procalcitonin: 0.1 ng/mL

## 2015-02-10 LAB — BRAIN NATRIURETIC PEPTIDE: B Natriuretic Peptide: 257.2 pg/mL — ABNORMAL HIGH (ref 0.0–100.0)

## 2015-02-10 LAB — COMPREHENSIVE METABOLIC PANEL
ALBUMIN: 3.5 g/dL (ref 3.5–5.2)
ALT: 13 U/L (ref 0–53)
AST: 22 U/L (ref 0–37)
Alkaline Phosphatase: 41 U/L (ref 39–117)
Anion gap: 1 — ABNORMAL LOW (ref 5–15)
BUN: 18 mg/dL (ref 6–23)
CHLORIDE: 106 mmol/L (ref 96–112)
CO2: 30 mmol/L (ref 19–32)
CREATININE: 0.66 mg/dL (ref 0.50–1.35)
Calcium: 8.4 mg/dL (ref 8.4–10.5)
GFR calc Af Amer: >90 mL/min (ref >90)
GFR calc non Af Amer: >90 mL/min (ref >90)
Glucose, Bld: 112 mg/dL — ABNORMAL HIGH (ref 70–99)
POTASSIUM: 4.1 mmol/L (ref 3.5–5.1)
SODIUM: 137 mmol/L (ref 135–145)
Total Bilirubin: 0.8 mg/dL (ref 0.3–1.2)
Total Protein: 6.8 g/dL (ref 6.0–8.3)

## 2015-02-10 LAB — CBC WITH DIFFERENTIAL/PLATELET
BASOS PCT: 0 % (ref 0–1)
Basophils Absolute: 0 10*3/uL (ref 0.0–0.1)
EOS ABS: 0.2 10*3/uL (ref 0.0–0.7)
Eosinophils Relative: 2 % (ref 0–5)
HEMATOCRIT: 36.7 % — AB (ref 39.0–52.0)
Hemoglobin: 11.4 g/dL — ABNORMAL LOW (ref 13.0–17.0)
LYMPHS PCT: 14 % (ref 12–46)
Lymphs Abs: 1.4 10*3/uL (ref 0.7–4.0)
MCH: 23 pg — ABNORMAL LOW (ref 26.0–34.0)
MCHC: 31.1 g/dL (ref 30.0–36.0)
MCV: 74.1 fL — ABNORMAL LOW (ref 78.0–100.0)
Monocytes Absolute: 1.2 10*3/uL — ABNORMAL HIGH (ref 0.1–1.0)
Monocytes Relative: 12 % (ref 3–12)
NEUTROS PCT: 72 % (ref 43–77)
Neutro Abs: 7.2 10*3/uL (ref 1.7–7.7)
Platelets: DECREASED 10*3/uL (ref 150–400)
RBC: 4.95 MIL/uL (ref 4.22–5.81)
RDW: 13.6 % (ref 11.5–15.5)
WBC: 10 10*3/uL (ref 4.0–10.5)

## 2015-02-10 LAB — BLOOD GAS, ARTERIAL
Acid-Base Excess: 1.1 mmol/L (ref 0.0–2.0)
Bicarbonate: 24.9 mEq/L — ABNORMAL HIGH (ref 20.0–24.0)
Drawn by: 312971
O2 CONTENT: 3 L/min
O2 Saturation: 89 %
PCO2 ART: 39.6 mmHg (ref 35.0–45.0)
Patient temperature: 101
TCO2: 26 mmol/L (ref 0–100)
pH, Arterial: 7.421 (ref 7.350–7.450)
pO2, Arterial: 58.6 mmHg — ABNORMAL LOW (ref 80.0–100.0)

## 2015-02-10 LAB — SURGICAL PCR SCREEN
MRSA, PCR: NEGATIVE
STAPHYLOCOCCUS AUREUS: NEGATIVE

## 2015-02-10 LAB — LACTIC ACID, PLASMA
Lactic Acid, Venous: 1.8 mmol/L (ref 0.5–2.0)
Lactic Acid, Venous: 2.8 mmol/L (ref 0.5–2.0)

## 2015-02-10 LAB — ABO/RH: ABO/RH(D): B POS

## 2015-02-10 LAB — PROTIME-INR
INR: 1.13 (ref 0.00–1.49)
Prothrombin Time: 14.6 seconds (ref 11.6–15.2)

## 2015-02-10 LAB — MAGNESIUM: MAGNESIUM: 2.1 mg/dL (ref 1.5–2.5)

## 2015-02-10 SURGERY — FIXATION, FEMUR, NECK, PERCUTANEOUS, USING SCREW
Anesthesia: General | Site: Hip | Laterality: Right

## 2015-02-10 MED ORDER — NEOSTIGMINE METHYLSULFATE 10 MG/10ML IV SOLN
INTRAVENOUS | Status: AC
  Filled 2015-02-10: qty 1

## 2015-02-10 MED ORDER — EPHEDRINE SULFATE 50 MG/ML IJ SOLN
INTRAMUSCULAR | Status: DC | PRN
  Administered 2015-02-10: 10 mg via INTRAVENOUS

## 2015-02-10 MED ORDER — ONDANSETRON HCL 4 MG/2ML IJ SOLN: 4.0000 mg | Freq: Once | INTRAMUSCULAR | Status: DC | PRN

## 2015-02-10 MED ORDER — ARTIFICIAL TEARS OP OINT
TOPICAL_OINTMENT | OPHTHALMIC | Status: AC
  Filled 2015-02-10: qty 3.5

## 2015-02-10 MED ORDER — OXYCODONE-ACETAMINOPHEN 5-325 MG PO TABS: 1.0000 | ORAL_TABLET | ORAL | Status: DC | PRN

## 2015-02-10 MED ORDER — FENTANYL CITRATE 0.05 MG/ML IJ SOLN
25.0000 ug | INTRAMUSCULAR | Status: DC | PRN
Start: 2015-02-10 — End: 2015-02-10

## 2015-02-10 MED ORDER — GLYCOPYRROLATE 0.2 MG/ML IJ SOLN
INTRAMUSCULAR | Status: DC | PRN
  Administered 2015-02-10: 0.4 mg via INTRAVENOUS

## 2015-02-10 MED ORDER — HYDROCODONE-ACETAMINOPHEN 5-325 MG PO TABS
1.0000 | ORAL_TABLET | Freq: Four times a day (QID) | ORAL | Status: DC | PRN
  Administered 2015-02-11: 1 via ORAL
  Administered 2015-02-11 – 2015-02-12 (×2): 2 via ORAL
  Administered 2015-02-12: 1 via ORAL
  Administered 2015-02-12 – 2015-02-13 (×2): 2 via ORAL
  Filled 2015-02-10: qty 1
  Filled 2015-02-10 (×2): qty 2
  Filled 2015-02-10: qty 1
  Filled 2015-02-10 (×3): qty 2

## 2015-02-10 MED ORDER — ONDANSETRON HCL 4 MG PO TABS: 4.0000 mg | ORAL_TABLET | Freq: Three times a day (TID) | ORAL | Status: DC | PRN

## 2015-02-10 MED ORDER — PHENYLEPHRINE 40 MCG/ML (10ML) SYRINGE FOR IV PUSH (FOR BLOOD PRESSURE SUPPORT)
PREFILLED_SYRINGE | INTRAVENOUS | Status: AC
  Filled 2015-02-10: qty 10

## 2015-02-10 MED ORDER — LIDOCAINE HCL (CARDIAC) 20 MG/ML IV SOLN
INTRAVENOUS | Status: DC | PRN
  Administered 2015-02-10: 50 mg via INTRAVENOUS

## 2015-02-10 MED ORDER — STERILE WATER FOR INJECTION IJ SOLN
INTRAMUSCULAR | Status: AC
  Filled 2015-02-10: qty 10

## 2015-02-10 MED ORDER — SODIUM CHLORIDE 0.9 % IV SOLN: Freq: Once | INTRAVENOUS | Status: DC

## 2015-02-10 MED ORDER — CEFAZOLIN SODIUM-DEXTROSE 2-3 GM-% IV SOLR
INTRAVENOUS | Status: AC
  Filled 2015-02-10: qty 50

## 2015-02-10 MED ORDER — LACTATED RINGERS IV SOLN
INTRAVENOUS | Status: DC | PRN
  Administered 2015-02-10 (×2): via INTRAVENOUS

## 2015-02-10 MED ORDER — ASPIRIN 325 MG PO TABS: 325.0000 mg | ORAL_TABLET | Freq: Every day | ORAL | Status: DC

## 2015-02-10 MED ORDER — ONDANSETRON HCL 4 MG/2ML IJ SOLN
INTRAMUSCULAR | Status: DC | PRN
  Administered 2015-02-10: 4 mg via INTRAVENOUS

## 2015-02-10 MED ORDER — PHENYLEPHRINE HCL 10 MG/ML IJ SOLN
10.0000 mg | INTRAVENOUS | Status: DC | PRN
  Administered 2015-02-10: 20 ug/min via INTRAVENOUS

## 2015-02-10 MED ORDER — FENTANYL CITRATE 0.05 MG/ML IJ SOLN
INTRAMUSCULAR | Status: AC
  Filled 2015-02-10: qty 5

## 2015-02-10 MED ORDER — SODIUM CHLORIDE 0.9 % IV SOLN
500.0000 mg | Freq: Two times a day (BID) | INTRAVENOUS | Status: DC
  Administered 2015-02-10 – 2015-02-12 (×4): 500 mg via INTRAVENOUS
  Filled 2015-02-10 (×5): qty 500

## 2015-02-10 MED ORDER — IMIPENEM-CILASTATIN 500 MG IV SOLR
500.0000 mg | Freq: Three times a day (TID) | INTRAVENOUS | Status: DC
  Administered 2015-02-10 – 2015-02-13 (×8): 500 mg via INTRAVENOUS
  Filled 2015-02-10 (×10): qty 500

## 2015-02-10 MED ORDER — EPHEDRINE SULFATE 50 MG/ML IJ SOLN
INTRAMUSCULAR | Status: AC
  Filled 2015-02-10: qty 1

## 2015-02-10 MED ORDER — DOCUSATE SODIUM 100 MG PO CAPS: 100.0000 mg | ORAL_CAPSULE | Freq: Two times a day (BID) | ORAL | Status: DC

## 2015-02-10 MED ORDER — PHENOL 1.4 % MT LIQD: 1.0000 | OROMUCOSAL | Status: DC | PRN

## 2015-02-10 MED ORDER — ROCURONIUM BROMIDE 100 MG/10ML IV SOLN
INTRAVENOUS | Status: DC | PRN
  Administered 2015-02-10: 35 mg via INTRAVENOUS

## 2015-02-10 MED ORDER — GLYCOPYRROLATE 0.2 MG/ML IJ SOLN
INTRAMUSCULAR | Status: AC
  Filled 2015-02-10: qty 2

## 2015-02-10 MED ORDER — NEOSTIGMINE METHYLSULFATE 10 MG/10ML IV SOLN
INTRAVENOUS | Status: DC | PRN
  Administered 2015-02-10: 3 mg via INTRAVENOUS

## 2015-02-10 MED ORDER — MENTHOL 3 MG MT LOZG: 1.0000 | LOZENGE | OROMUCOSAL | Status: DC | PRN

## 2015-02-10 MED ORDER — PROPOFOL 10 MG/ML IV BOLUS
INTRAVENOUS | Status: DC | PRN
  Administered 2015-02-10: 120 mg via INTRAVENOUS

## 2015-02-10 MED ORDER — CEFAZOLIN SODIUM-DEXTROSE 2-3 GM-% IV SOLR
2.0000 g | Freq: Once | INTRAVENOUS | Status: AC
  Administered 2015-02-10: 2 g via INTRAVENOUS

## 2015-02-10 MED ORDER — DEXTROSE 5 % IV SOLN
1.0000 g | INTRAVENOUS | Status: DC
  Filled 2015-02-10: qty 1

## 2015-02-10 MED ORDER — ASPIRIN EC 325 MG PO TBEC
325.0000 mg | DELAYED_RELEASE_TABLET | Freq: Every day | ORAL | Status: DC
  Administered 2015-02-11 – 2015-02-13 (×3): 325 mg via ORAL
  Filled 2015-02-10 (×3): qty 1

## 2015-02-10 MED ORDER — PHENYLEPHRINE HCL 10 MG/ML IJ SOLN
INTRAMUSCULAR | Status: DC | PRN
  Administered 2015-02-10 (×2): 80 ug via INTRAVENOUS
  Administered 2015-02-10: 120 ug via INTRAVENOUS

## 2015-02-10 MED ORDER — ALBUMIN HUMAN 5 % IV SOLN
INTRAVENOUS | Status: DC | PRN
  Administered 2015-02-10: 11:00:00 via INTRAVENOUS

## 2015-02-10 MED ORDER — CEFAZOLIN SODIUM-DEXTROSE 2-3 GM-% IV SOLR
2.0000 g | Freq: Four times a day (QID) | INTRAVENOUS | Status: DC
Start: 2015-02-10 — End: 2015-02-10
  Administered 2015-02-10: 2 g via INTRAVENOUS
  Filled 2015-02-10 (×2): qty 50

## 2015-02-10 MED ORDER — PROPOFOL 10 MG/ML IV BOLUS
INTRAVENOUS | Status: AC
  Filled 2015-02-10: qty 20

## 2015-02-10 MED ORDER — ONDANSETRON HCL 4 MG/2ML IJ SOLN
INTRAMUSCULAR | Status: AC
  Filled 2015-02-10: qty 2

## 2015-02-10 MED ORDER — ROCURONIUM BROMIDE 50 MG/5ML IV SOLN
INTRAVENOUS | Status: AC
  Filled 2015-02-10: qty 1

## 2015-02-10 MED ORDER — FENTANYL CITRATE 0.05 MG/ML IJ SOLN
INTRAMUSCULAR | Status: DC | PRN
  Administered 2015-02-10 (×2): 100 ug via INTRAVENOUS

## 2015-02-10 MED ORDER — LIDOCAINE HCL (CARDIAC) 20 MG/ML IV SOLN
INTRAVENOUS | Status: AC
  Filled 2015-02-10: qty 5

## 2015-02-10 SURGICAL SUPPLY — 44 items
BIT DRILL 4.9 CANNULATED (BIT) ×1
BIT DRILL CANN QC 4.9 LRG (BIT) IMPLANT
COVER PERINEAL POST (MISCELLANEOUS) ×2 IMPLANT
COVER SURGICAL LIGHT HANDLE (MISCELLANEOUS) ×2 IMPLANT
DRAPE IMP U-DRAPE 54X76 (DRAPES) ×2 IMPLANT
DRAPE STERI IOBAN 125X83 (DRAPES) ×2 IMPLANT
DRILL BIT CANNULATED 4.9 (BIT) ×2
DRSG EMULSION OIL 3X3 NADH (GAUZE/BANDAGES/DRESSINGS) ×2 IMPLANT
DRSG MEPILEX BORDER 4X4 (GAUZE/BANDAGES/DRESSINGS) ×1 IMPLANT
DRSG TEGADERM 4X4.75 (GAUZE/BANDAGES/DRESSINGS) ×2 IMPLANT
DURAPREP 26ML APPLICATOR (WOUND CARE) ×2 IMPLANT
ELECT REM PT RETURN 9FT ADLT (ELECTROSURGICAL) ×2
ELECTRODE REM PT RTRN 9FT ADLT (ELECTROSURGICAL) ×1 IMPLANT
GAUZE SPONGE 2X2 8PLY STRL LF (GAUZE/BANDAGES/DRESSINGS) IMPLANT
GAUZE SPONGE 4X4 12PLY STRL (GAUZE/BANDAGES/DRESSINGS) ×2 IMPLANT
GLOVE BIO SURGEON STRL SZ7 (GLOVE) ×2 IMPLANT
GLOVE BIO SURGEON STRL SZ7.5 (GLOVE) ×4 IMPLANT
GLOVE BIOGEL PI IND STRL 7.0 (GLOVE) ×1 IMPLANT
GLOVE BIOGEL PI IND STRL 8 (GLOVE) ×1 IMPLANT
GLOVE BIOGEL PI INDICATOR 7.0 (GLOVE) ×1
GLOVE BIOGEL PI INDICATOR 8 (GLOVE) ×1
GOWN STRL REUS W/ TWL LRG LVL3 (GOWN DISPOSABLE) ×1 IMPLANT
GOWN STRL REUS W/TWL LRG LVL3 (GOWN DISPOSABLE) ×2
GUIDEWIRE THRD ASNIS 3.2X300 (WIRE) ×3 IMPLANT
KIT BASIN OR (CUSTOM PROCEDURE TRAY) ×2 IMPLANT
KIT ROOM TURNOVER OR (KITS) ×2 IMPLANT
LINER BOOT UNIVERSAL DISP (MISCELLANEOUS) ×2 IMPLANT
MANIFOLD NEPTUNE II (INSTRUMENTS) ×2 IMPLANT
NS IRRIG 1000ML POUR BTL (IV SOLUTION) ×2 IMPLANT
PACK GENERAL/GYN (CUSTOM PROCEDURE TRAY) ×2 IMPLANT
PAD ARMBOARD 7.5X6 YLW CONV (MISCELLANEOUS) ×4 IMPLANT
SCREW ASNIS 85MM (Screw) ×1 IMPLANT
SCREW ASNIS 90MM (Screw) ×1 IMPLANT
SCREW CANN 6.5X80 STRL (Screw) ×1 IMPLANT
SPONGE GAUZE 2X2 STER 10/PKG (GAUZE/BANDAGES/DRESSINGS) ×1
STAPLER VISISTAT 35W (STAPLE) ×2 IMPLANT
SUT ETHILON 2 0 FS 18 (SUTURE) ×1 IMPLANT
SUT MON AB 2-0 CT1 36 (SUTURE) ×2 IMPLANT
SUT VIC AB 0 CT1 27 (SUTURE) ×2
SUT VIC AB 0 CT1 27XBRD ANBCTR (SUTURE) ×1 IMPLANT
TOWEL OR 17X24 6PK STRL BLUE (TOWEL DISPOSABLE) ×2 IMPLANT
TOWEL OR 17X26 10 PK STRL BLUE (TOWEL DISPOSABLE) ×2 IMPLANT
TOWEL OR NON WOVEN STRL DISP B (DISPOSABLE) ×2 IMPLANT
WATER STERILE IRR 1000ML POUR (IV SOLUTION) ×2 IMPLANT

## 2015-02-10 NOTE — Progress Notes (Signed)
Orthopedic Tech Progress Note Patient Details:  [name] LagosJimmy L [name] 12/10/1933 [id number]  Patient ID: [name] LagosJimmy L [name], male   DOB: 04/16/1934, 81 y.o.   MRN: [id number] Pt unable to use trapeze bar patient helper  [name], [name] 02/10/2015, 2:08 PM

## 2015-02-10 NOTE — Progress Notes (Signed)
Gen. surgery note:  Dr. Renaye Rakersim Murphy asked us to evaluate this patient in the preop holding area regarding a right inguinal hernia. The patient has an acute right hip fracture and is going to undergo a hip pinning procedure this morning. He has had a right inguinal hernia since 2007. He states that he saw Dr. Misty StanleyKristen Earl but was told to avoid surgery unless he  became symptomatic. He states that he was seen in the emergency department 2 months ago for right groin pain. The hernia was reduced. He was planning to see Dr. Jimmye NormanJames Wyatt in the office but never made an appointment. The patient and his son asked if he should have the hernia repaired at the same time.  He has some intermittent discomfort from the hernia but it is always reducible. He is tolerating a diet and is having normal bowel function. He has never had hernia surgery before.     Exam:  Patient is appropriate and answers questions normally. His son is at the bedside. Abdomen is soft, non-distended, nontender Right groin reveals what appears to be an indirect right inguinal hernia that is easily reducible and nontender. Skin is healthy Extremities: He is holding his right hip in 90 flexion and cannot extend this due to pain.     Assessment/plan:  Reducible right inguinal hernia, probably indirect type. I do not advise having this repaired at the same time as his hip fracture. I feel that could potentially complicate recovery and rehabilitation. I recommend that [facility], surgeon of his choice, in a few months after he has completed his rehabilitation from the hip fracture.      Avoid constipation. Recommend twice a day stool softeners and Mira lax.  Subcapital right femoral neck fracture. For ORIF this morning  Hypertension, controlled on beta blocker Thrombocytopenia History of stroke, on aspirin, residual r right hemiparesis. Apparently he has had a slow gradual functional and  cognitive decline since his stroke. This may have contributed to his recent fall and hip fracture. Microcytic anemia BPH History tuberculosis of lung   Kaivon Livesey M. Derrell LollingIngram, M.D., [facility] General and Minimally invasive Surgery Breast and Colorectal Surgery Office:   [phone] Pager:   [phone]

## 2015-02-10 NOTE — Op Note (Signed)
02/09/2015 - 02/10/2015  11:15 AM  PATIENT:  [name]    PRE-OPERATIVE DIAGNOSIS:  right hip fracture  POST-OPERATIVE DIAGNOSIS:  Same  PROCEDURE:  CANNULATED HIP PINNING  SURGEON:  MURPHY, Jewel BaizeIMOTHY D, MD  ASSISTANT: Janalee DaneBrittney Kelly, PA-C, She was present and scrubbed throughout the case, critical for completion in a timely fashion, and for retraction, instrumentation, and closure.   ANESTHESIA:   General  PREOPERATIVE INDICATIONS:  [name] LagosJimmy L Teschner is a  81 y.o. male who fell and was found to have a diagnosis of right hip fracture who elected for surgical management.    The risks benefits and alternatives were discussed with the patient preoperatively including but not limited to the risks of infection, bleeding, nerve injury, cardiopulmonary complications, blood clots, malunion, nonunion, avascular necrosis, the need for revision surgery, the potential for conversion to hemiarthroplasty, among others, and the patient was willing to proceed.  OPERATIVE IMPLANTS: 6.5 mm cannulated screws x3  OPERATIVE FINDINGS: Clinical osteoporosis with weak bone, proximal femur  OPERATIVE PROCEDURE: The patient was brought to the operating room and placed in supine position. IV antibiotics were given. General anesthesia administered. Foley was also given. The patient was placed on the fracture table. The operative extremity was positioned, without any significant reduction maneuver and was prepped and draped in usual sterile fashion.  Time out was performed.  Small incisions were made distal to the greater trochanter, and 3 guidewires were introduced Into an inverted triangle configuration. The lengths were measured. The reduction was slightly valgus, and near-anatomic. I opened the cortex with a cannulated drill, and then placed the screws into position. Satisfactory fixation was achieved. I sequentially tightened the screws by hand.  I performed a live fluoroscopic exam and no screw penetrance was  noted. All threads crossed the fracture site.   The wounds were irrigated copiously, and repaired with Vicryl with Steri-Strips and sterile gauze. There no complications and the patient tolerated the procedure well.  The patient will be weightbearing as tolerated, VTE prophylaxis will be: ASA 325   This note was generated using a template and dragon dictation system. In light of that, I have reviewed the note and all aspects of it are applicable to this case. Any dictation errors are due to the computerized dictation system.

## 2015-02-10 NOTE — Progress Notes (Signed)
Notified Janalee DaneBrittney Kelly, PA that patient's leg is abducted and patient states he cannot straighten leg. Patient is drowsy and resting and does not appear to be in any pain but does state some pain. Brittney said that Dr. Eulah PontMurphy will re-assess patient soon.

## 2015-02-10 NOTE — Progress Notes (Signed)
Notified Dr. Arbutus Leasat of the following: Patient has wet, crackling, lung sounds. Pt on 3L02 and O2 Sat fluctuates between 86-94 %. Will check temp and HR. Dr. Arbutus Leasat stated he will re-assess patient. New orders given.

## 2015-02-10 NOTE — Progress Notes (Signed)
CRITICAL VALUE ALERT  Critical value received:  Lactic Acid 2.8  Date of notification: February 10, 2015  Time of notification:  22.21  Critical value read back:  Yes  Nurse who received alert: Hilbert Corriganarolyn Joei Frangos, RN  MD notified (1st page): Burnadette PeterLynch with [email or web address] by paging system  Time of first page:  22:23  MD notified (2nd page): NA  Time of second page: NA  Responding MD:  Burnadette PeterLynch  Time MD responded: 22:23

## 2015-02-10 NOTE — Discharge Instructions (Signed)
Weight bearing as tolerated in the right hip  Continue ASA 325mg   for 30 days post op  Keep dressing clean and dry till follow up

## 2015-02-10 NOTE — Progress Notes (Addendum)
Dr. Eulah PontMurphy, T made aware of rxn to PCN when he gave verbal order for Ancef. Per Dr. Eulah PontMurphy, T ok to give ancef 2 grams

## 2015-02-10 NOTE — Anesthesia Postprocedure Evaluation (Signed)
Anesthesia Post Note  Patient: [name]  Procedure(s) Performed: Procedure(s) (LRB): CANNULATED HIP PINNING (Right)  Anesthesia type: General  Patient location: PACU  Post pain: Pain level controlled and Adequate analgesia  Post assessment: Post-op Vital signs reviewed, Patient's Cardiovascular Status Stable, Respiratory Function Stable, Patent Airway and Pain level controlled  Last Vitals:  Filed Vitals:   02/10/15 1245  BP: 117/67  Pulse: 70  Temp:   Resp: 11    Post vital signs: Reviewed and stable  Level of consciousness: awake, alert  and oriented  Complications: No apparent anesthesia complications

## 2015-02-10 NOTE — Progress Notes (Signed)
ANTIBIOTIC CONSULT NOTE - INITIAL  Pharmacy Consult for Vanco/Cefepime Indication: PNA  Allergies  Allergen Reactions  . Penicillins Hives    Patient Measurements:   Adjusted Body Weight:    Vital Signs: Temp: 101.1 F (38.4 C) (04/02 1812) Temp Source: Axillary (04/02 1812) BP: 145/73 mmHg (04/02 1812) Pulse Rate: 102 (04/02 1812) Intake/Output from previous day: 04/01 0701 - 04/02 0700 In: 319.2 [I.V.:319.2] Out: 150 [Urine:150] Intake/Output from this shift:    Labs:  Recent Labs  02/09/15 1807 02/10/15 0458  WBC 9.4 10.0  HGB 11.9* 11.4*  PLT 127* PLATELET CLUMPS NOTED ON SMEAR, COUNT APPEARS DECREASED  CREATININE 0.83 0.66   CrCl cannot be calculated (Unknown ideal weight.). No results for input(s): VANCOTROUGH, VANCOPEAK, VANCORANDOM, GENTTROUGH, GENTPEAK, GENTRANDOM, TOBRATROUGH, TOBRAPEAK, TOBRARND, AMIKACINPEAK, AMIKACINTROU, AMIKACIN in the last 72 hours.   Microbiology: Recent Results (from the past 720 hour(s))  Surgical pcr screen     Status: None   Collection Time: 02/10/15  6:07 AM  Result Value Ref Range Status   MRSA, PCR NEGATIVE NEGATIVE Final   Staphylococcus aureus NEGATIVE NEGATIVE Final    Comment:        The Xpert SA Assay (FDA approved for NASAL specimens in patients over 79 years of age), is one component of a comprehensive surveillance program.  Test performance has been validated by [facility] for patients greater than or equal to 79 year old. It is not intended to diagnose infection nor to guide or monitor treatment.     Medical History: Past Medical History  Diagnosis Date  . Stroke   . Hypertension   . Enlarged prostate   . Inguinal hernia     Medications:  Prescriptions prior to admission  Medication Sig Dispense Refill Last Dose  . Dutasteride-Tamsulosin HCl 0.5-0.4 MG CAPS Take 1 capsule by mouth daily.   02/09/2015 at Unknown time  . ibuprofen (ADVIL,MOTRIN) 200 MG tablet Take 400 mg by mouth every 6 (six)  hours as needed for moderate pain.   02/09/2015 at Unknown time  . metoprolol succinate (TOPROL-XL) 50 MG 24 hr tablet Take 50 mg by mouth daily. Take with or immediately following a meal.   02/09/2015 at 1000  . Elastic Bandages & Supports (HERNIA SUPPORT LEFT MEDIUM) MISC Hernia truss. Wear as needed for support. May select variety 1 each 0    Assessment: 81 y/o M with R hip fx s/p hip pinning 4/2. Post-op patient developed hypoxemia with poor lung sounds and was placed on O2.Temp was 101.1 WBC 10, Scr 0.66 with estimated CrCl 46. PMH includes  CVA, HTN, thrombocytopenia, anemia, BPH, h/o TB  Goal of Therapy:  Vancomycin trough level 15-20 mcg/ml  Plan:  Cefepime 1g IV q24h--PCN allergy. Tolerated Ancef pre-op Vancomycin 500mg  IV q12hrs. Vancomycin trough after 3-5 doses at steady state.  Hero Mccathern S. Merilynn Finlandobertson, PharmD, BCPS Clinical Staff Pharmacist Pager [phone]  Misty Stanleyobertson, Evella Kasal Stillinger 02/10/2015,7:03 PM

## 2015-02-10 NOTE — Progress Notes (Signed)
PROGRESS NOTE  [name] ZOX:[id number] DOB: Sep 11, 1934 DOA: 02/09/2015 PCP: Lina Sayre, MD  Brief history 81 year old male with a history of hypertension and right hemiparesis secondary to stroke in 2000 and 2004. According to the patient's son, the patient has had a slow gradual functional and current decline since his strokes. At baseline, he is able to transfer and ambulate using a walker. Unfortunately, the patient had a mechanical fall when he missed his chair and attempt to sit down on 02/08/2015. Because of increasing pain and inability to bear weight on his right lower extremity, the patient presented to the emergency department. CT of the right hip revealed a subcapital right femoral neck fracture. Orthopedics was consulted, and surgery is scheduled for 02/10/2015. Assessment/Plan: Right subcapital femoral neck fracture -appreciate ortho -surgery planned on 02/10/15 -pain control -PT/OT after surgery -suspect he will need SNF Hypertension -Continue metoprolol succinate -Controlled Thrombocytopenia -Check serum B12 BPH -continue avodart and flomax R-inguinal hernia -spoke with [facility]--not feasible to perform hernia surgery at same time as orthopedic surgery History of stroke -continue ASA -residual R-hemiparesis R-foot pain and edema -xray R-foot and ankle Microcytic anemia -check iron studies   Family Communication:   Son and wife updated at beside Disposition Plan:   Home when medically stable       Procedures/Studies: Dg Chest 1 View  02/09/2015   CLINICAL DATA:  Fall 3 days ago with chest pain, initial encounter  EXAM: CHEST  1 VIEW  COMPARISON:  12/12/2009  FINDINGS: Cardiac shadow is within normal limits. The thoracic aorta is tortuous and mildly calcified. Lungs are clear. No pneumothorax is noted. No acute bony abnormality is seen.  IMPRESSION: No acute abnormality noted.   Electronically Signed   By: Alcide Clever M.D.   On: 02/09/2015 19:00    Ct Head Wo Contrast  02/09/2015   CLINICAL DATA:  Fall 2 days ago.  CVA with right-sided weakness.  EXAM: CT HEAD WITHOUT CONTRAST  TECHNIQUE: Contiguous axial images were obtained from the base of the skull through the vertex without intravenous contrast.  COMPARISON:  03/22/2009  FINDINGS: Sinuses/Soft tissues: Mucosal thickening of left maxillary sinus. Clear mastoid air cells. Occipital craniotomy.  Intracranial: Generalized atrophy. Moderate low density in the periventricular white matter likely related to small vessel disease. Slightly increased. Cerebellar atrophy with encephalomalacia in the medial right cerebellar hemisphere, as before. Dolichoectasia of the right vertebral artery. No mass lesion, hemorrhage, hydrocephalus, acute infarct, intra-axial, or extra-axial fluid collection.  IMPRESSION: 1.  No acute intracranial abnormality. 2. Cerebral/cerebellar atrophy with progressive small vessel ischemic change. 3. Sinus disease and prior craniotomy.   Electronically Signed   By: Jeronimo Greaves M.D.   On: 02/09/2015 18:37   Ct Hip Right Wo Contrast  02/09/2015   CLINICAL DATA:  Fall, right hip pain  EXAM: CT OF THE RIGHT HIP WITHOUT CONTRAST  TECHNIQUE: Multidetector CT imaging of the right hip was performed according to the standard protocol. Multiplanar CT image reconstructions were also generated.  COMPARISON:  Right hip radiographs dated 02/09/2015  FINDINGS: Subcapital right femoral neck fracture (series 6/image 15), impacted/foreshortened (series 6/image 20).  Visualized bony pelvis appears intact.  Right inguinal/scrotal hernia containing fat and small bowel (series 3/ image 13), incompletely visualized.  Foley catheter in the bladder.  Moderate stool in the rectum.  IMPRESSION: Subcapital right femoral neck fracture, impacted/foreshortened.   Electronically Signed   By: Charline Bills M.D.   On:  02/09/2015 22:03   Dg Hip Unilat With Pelvis Min 4 Views Right  02/09/2015   CLINICAL DATA:  Fall  3 days ago with right hip pain, initial encounter  EXAM: RIGHT HIP (WITH PELVIS) 4+ VIEWS  COMPARISON:  None.  FINDINGS: Pelvic ring is intact. The proximal left femur is within normal limits. Only limited evaluation of the proximal right femur is available due to the patient's inability to adequately position himself. There is some regularity of the femoral neck superimposed over the femoral head. This is suspicious for subcapital femoral neck fracture. CT may be helpful for further evaluation.  IMPRESSION: Changes suspicious for a subcapital femoral neck fracture. CT may be helpful for further evaluation given difficulty positioning the patient.   Electronically Signed   By: Alcide CleverMark  Lukens M.D.   On: 02/09/2015 19:11   Dg Femur, Min 2 Views Right  02/09/2015   CLINICAL DATA:  Fall 3 days ago with right hip and leg pain  EXAM: RIGHT FEMUR 2 VIEWS  COMPARISON:  None.  FINDINGS: Irregularity of the proximal femur is again identified suspicious for subcapital femoral neck fracture. The distal femur is within normal limits. The knee joint is unremarkable.  IMPRESSION: Changes suspicious for proximal right femoral fracture   Electronically Signed   By: Alcide CleverMark  Lukens M.D.   On: 02/09/2015 19:12         Subjective: Patient complains of right hip pain. Denies any fevers, chills, chest pain, shortness breath, nausea, vomiting, diarrhea, dysuria, hematuria.  Objective: Filed Vitals:   02/09/15 2000 02/09/15 2200 02/09/15 2243 02/10/15 0442  BP: 138/81 135/76 140/82 130/73  Pulse: 79 72 79 67  Temp:   98.9 F (37.2 C) 98.4 F (36.9 C)  TempSrc:      Resp: 17 15 15 15   SpO2: 97% 98% 94% 96%    Intake/Output Summary (Last 24 hours) at 02/10/15 0843 Last data filed at 02/10/15 0750  Gross per 24 hour  Intake 319.17 ml  Output    150 ml  Net 169.17 ml   Weight change:  Exam:   General:  Pt is alert, follows commands appropriately, not in acute distress  HEENT: No icterus, No thrush, No neck  mass, [place]/AT  Cardiovascular: RRR, S1/S2, no rubs, no gallops  Respiratory: CTA bilaterally, no wheezing, no crackles, no rhonchi  Abdomen: Soft/+BS, non tender, non distended, no guarding; right inguinal hernia that is reducible.  Extremities: R-foot edema, No lymphangitis, No petechiae, No rashes, no synovitis  Data Reviewed: Basic Metabolic Panel:  Recent Labs Lab 02/09/15 1807 02/10/15 0458  NA 138 137  K 4.6 4.1  CL 103 106  CO2 26 30  GLUCOSE 124* 112*  BUN 26* 18  CREATININE 0.83 0.66  CALCIUM 8.9 8.4  MG  --  2.1   Liver Function Tests:  Recent Labs Lab 02/09/15 1900 02/10/15 0458  AST 23 22  ALT 14 13  ALKPHOS 43 41  BILITOT 0.6 0.8  PROT 7.2 6.8  ALBUMIN 3.8 3.5   No results for input(s): LIPASE, AMYLASE in the last 168 hours. No results for input(s): AMMONIA in the last 168 hours. CBC:  Recent Labs Lab 02/09/15 1807 02/10/15 0458  WBC 9.4 10.0  NEUTROABS 7.6 7.2  HGB 11.9* 11.4*  HCT 38.1* 36.7*  MCV 74.0* 74.1*  PLT 127* PLATELET CLUMPS NOTED ON SMEAR, COUNT APPEARS DECREASED   Cardiac Enzymes:  Recent Labs Lab 02/09/15 1807  TROPONINI <0.03   BNP: Invalid input(s): POCBNP CBG: No  results for input(s): GLUCAP in the last 168 hours.  Recent Results (from the past 240 hour(s))  Surgical pcr screen     Status: None   Collection Time: 02/10/15  6:07 AM  Result Value Ref Range Status   MRSA, PCR NEGATIVE NEGATIVE Final   Staphylococcus aureus NEGATIVE NEGATIVE Final    Comment:        The Xpert SA Assay (FDA approved for NASAL specimens in patients over 68 years of age), is one component of a comprehensive surveillance program.  Test performance has been validated by [facility] for patients greater than or equal to 35 year old. It is not intended to diagnose infection nor to guide or monitor treatment.      Scheduled Meds: . sodium chloride   Intravenous STAT  . sodium chloride   Intravenous Once  . aspirin EC  81 mg  Oral Daily  . docusate sodium  100 mg Oral BID  . dutasteride  0.5 mg Oral Daily   And  . tamsulosin  0.4 mg Oral Daily  . metoprolol succinate  50 mg Oral Daily   Continuous Infusions: . sodium chloride 75 mL/hr at 02/10/15 0006     Oreta Soloway, DO  [facility] Pager [phone]  If 7PM-7AM, please contact night-coverage [email or web address] Password [email or web address] 02/10/2015, 8:43 AM   LOS: 1 day

## 2015-02-10 NOTE — Progress Notes (Signed)
Janalee DaneBrittney Kelly, PA assessed patient and said that patient does not appear to have dislocated hip, but that patient is most likely stiffening leg d/t pain. Also, she said that R leg was difficult to straighten prior to surgery as well.

## 2015-02-10 NOTE — Transfer of Care (Signed)
Immediate Anesthesia Transfer of Care Note  Patient: [name]  Procedure(s) Performed: Procedure(s): CANNULATED HIP PINNING (Right)  Patient Location: PACU  Anesthesia Type:General  Level of Consciousness: awake, patient cooperative and responds to stimulation  Airway & Oxygen Therapy: Patient Spontanous Breathing and Patient connected to nasal cannula oxygen  Post-op Assessment: Report given to RN and Post -op Vital signs reviewed and stable  Post vital signs: Reviewed and stable  Last Vitals:  Filed Vitals:   02/10/15 0442  BP: 130/73  Pulse: 67  Temp: 36.9 C  Resp: 15    Complications: No apparent anesthesia complications

## 2015-02-10 NOTE — Consult Note (Signed)
ORTHOPAEDIC CONSULTATION  REQUESTING PHYSICIAN: Allie Bossier, MD  Chief Complaint: right hip pain  HPI: [name] is a 81 y.o. male who complains of  Right hip pain.  Patient reports that approximately 2-3 days ago he was sitting on the toilet when he slipped trying to move to a chair next to the bathtub.  He fell on the right hip.  Patient apparently has done this in the past without major injury, so given that he was only in minor pain, the patient thought medical attention was not necessary.  After the incident, the patient has experienced progressive weakness and is unable to bear weight in the right leg.  He was ambulating with a walker prior to the fall. History of stroke with residual hemiparesis of the right side. Right arm contracted at baseline.  Past Medical History  Diagnosis Date  . Stroke   . Hypertension   . Enlarged prostate   . Inguinal hernia    Past Surgical History  Procedure Laterality Date  . Brain surgery     History   Social History  . Marital Status: Single    Spouse Name: N/A  . Number of Children: N/A  . Years of Education: N/A   Social History Main Topics  . Smoking status: Never Smoker   . Smokeless tobacco: Never Used  . Alcohol Use: No  . Drug Use: No  . Sexual Activity: Not on file   Other Topics Concern  . None   Social History Narrative   History reviewed. No pertinent family history. Allergies  Allergen Reactions  . Penicillins Hives   Prior to Admission medications   Medication Sig Start Date End Date Taking? Authorizing Provider  Dutasteride-Tamsulosin HCl 0.5-0.4 MG CAPS Take 1 capsule by mouth daily.   Yes Historical Provider, MD  ibuprofen (ADVIL,MOTRIN) 200 MG tablet Take 400 mg by mouth every 6 (six) hours as needed for moderate pain.   Yes Historical Provider, MD  metoprolol succinate (TOPROL-XL) 50 MG 24 hr tablet Take 50 mg by mouth daily. Take with or immediately following a meal.   Yes Historical  Provider, MD  Elastic Bandages & Supports (HERNIA SUPPORT LEFT MEDIUM) MISC Hernia truss. Wear as needed for support. May select variety 12/29/14   Davonna Belling, MD   Dg Chest 1 View  02/09/2015   CLINICAL DATA:  Fall 3 days ago with chest pain, initial encounter  EXAM: CHEST  1 VIEW  COMPARISON:  12/12/2009  FINDINGS: Cardiac shadow is within normal limits. The thoracic aorta is tortuous and mildly calcified. Lungs are clear. No pneumothorax is noted. No acute bony abnormality is seen.  IMPRESSION: No acute abnormality noted.   Electronically Signed   By: Inez Catalina M.D.   On: 02/09/2015 19:00   Ct Head Wo Contrast  02/09/2015   CLINICAL DATA:  Fall 2 days ago.  CVA with right-sided weakness.  EXAM: CT HEAD WITHOUT CONTRAST  TECHNIQUE: Contiguous axial images were obtained from the base of the skull through the vertex without intravenous contrast.  COMPARISON:  03/22/2009  FINDINGS: Sinuses/Soft tissues: Mucosal thickening of left maxillary sinus. Clear mastoid air cells. Occipital craniotomy.  Intracranial: Generalized atrophy. Moderate low density in the periventricular white matter likely related to small vessel disease. Slightly increased. Cerebellar atrophy with encephalomalacia in the medial right cerebellar hemisphere, as before. Dolichoectasia of the right vertebral artery. No mass lesion, hemorrhage, hydrocephalus, acute infarct, intra-axial, or extra-axial fluid collection.  IMPRESSION: 1.  No  acute intracranial abnormality. 2. Cerebral/cerebellar atrophy with progressive small vessel ischemic change. 3. Sinus disease and prior craniotomy.   Electronically Signed   By: Abigail Miyamoto M.D.   On: 02/09/2015 18:37   Ct Hip Right Wo Contrast  02/09/2015   CLINICAL DATA:  Fall, right hip pain  EXAM: CT OF THE RIGHT HIP WITHOUT CONTRAST  TECHNIQUE: Multidetector CT imaging of the right hip was performed according to the standard protocol. Multiplanar CT image reconstructions were also generated.   COMPARISON:  Right hip radiographs dated 02/09/2015  FINDINGS: Subcapital right femoral neck fracture (series 6/image 15), impacted/foreshortened (series 6/image 20).  Visualized bony pelvis appears intact.  Right inguinal/scrotal hernia containing fat and small bowel (series 3/ image 13), incompletely visualized.  Foley catheter in the bladder.  Moderate stool in the rectum.  IMPRESSION: Subcapital right femoral neck fracture, impacted/foreshortened.   Electronically Signed   By: Julian Hy M.D.   On: 02/09/2015 22:03   Dg Hip Unilat With Pelvis Min 4 Views Right  02/09/2015   CLINICAL DATA:  Fall 3 days ago with right hip pain, initial encounter  EXAM: RIGHT HIP (WITH PELVIS) 4+ VIEWS  COMPARISON:  None.  FINDINGS: Pelvic ring is intact. The proximal left femur is within normal limits. Only limited evaluation of the proximal right femur is available due to the patient's inability to adequately position himself. There is some regularity of the femoral neck superimposed over the femoral head. This is suspicious for subcapital femoral neck fracture. CT may be helpful for further evaluation.  IMPRESSION: Changes suspicious for a subcapital femoral neck fracture. CT may be helpful for further evaluation given difficulty positioning the patient.   Electronically Signed   By: Inez Catalina M.D.   On: 02/09/2015 19:11   Dg Femur, Min 2 Views Right  02/09/2015   CLINICAL DATA:  Fall 3 days ago with right hip and leg pain  EXAM: RIGHT FEMUR 2 VIEWS  COMPARISON:  None.  FINDINGS: Irregularity of the proximal femur is again identified suspicious for subcapital femoral neck fracture. The distal femur is within normal limits. The knee joint is unremarkable.  IMPRESSION: Changes suspicious for proximal right femoral fracture   Electronically Signed   By: Inez Catalina M.D.   On: 02/09/2015 19:12    Positive ROS: All other systems have been reviewed and were otherwise negative with the exception of those mentioned in  the HPI and as above.  Labs cbc  Recent Labs  02/09/15 1807 02/10/15 0458  WBC 9.4 10.0  HGB 11.9* 11.4*  HCT 38.1* 36.7*  PLT 127* PENDING    Labs inflam No results for input(s): CRP in the last 72 hours.  Invalid input(s): ESR  Labs coag  Recent Labs  02/10/15 0458  INR 1.13     Recent Labs  02/09/15 1807 02/10/15 0458  NA 138 137  K 4.6 4.1  CL 103 106  CO2 26 30  GLUCOSE 124* 112*  BUN 26* 18  CREATININE 0.83 0.66  CALCIUM 8.9 8.4    Physical Exam: Filed Vitals:   02/10/15 0442  BP: 130/73  Pulse: 67  Temp: 98.4 F (36.9 C)  Resp: 15   General: Alert, no acute distress Cardiovascular: No pedal edema Respiratory: No cyanosis, no use of accessory musculature GI: No organomegaly, abdomen is soft and non-tender Skin: No lesions in the area of chief complaint other than those listed below in MSK exam.  Neurologic: Sensation intact distally Psychiatric: Patient is competent for  consent with normal mood and affect Lymphatic: No axillary or cervical lymphadenopathy  MUSCULOSKELETAL:  Right hemiparesis due to prior stroke Right leg is contracted, deformity noted at the proximal hip around the femoral neck, DP/PT pulses intact. Other extremities are atraumatic with painless ROM and NVI.  Assessment: Right hip fracture  Plan: Plan to take to the OR this am for hip pinning Weight Bearing Status: Bedrest and NWB in the right hip  PT VTE px: SCD's and chemical prophylaxis on hold due to OR in the am   Gae Dry, PA-C Cell [phone]   02/10/2015 6:35 AM

## 2015-02-10 NOTE — Anesthesia Preprocedure Evaluation (Signed)
Anesthesia Evaluation  Patient identified by MRN, date of birth, ID band Patient awake    Reviewed: Allergy & Precautions, NPO status , Patient's Chart, lab work & pertinent test results  Airway Mallampati: II   Neck ROM: full    Dental   Pulmonary neg pulmonary ROS,  breath sounds clear to auscultation        Cardiovascular hypertension, Rhythm:regular Rate:Normal     Neuro/Psych  Neuromuscular disease CVA, Residual Symptoms    GI/Hepatic   Endo/Other    Renal/GU      Musculoskeletal   Abdominal   Peds  Hematology   Anesthesia Other Findings   Reproductive/Obstetrics                             Anesthesia Physical Anesthesia Plan  ASA: III  Anesthesia Plan: General   Post-op Pain Management:    Induction: Intravenous  Airway Management Planned: Oral ETT  Additional Equipment:   Intra-op Plan:   Post-operative Plan: Extubation in OR  Informed Consent: I have reviewed the patients History and Physical, chart, labs and discussed the procedure including the risks, benefits and alternatives for the proposed anesthesia with the patient or authorized representative who has indicated his/her understanding and acceptance.     Plan Discussed with: CRNA, Anesthesiologist and Surgeon  Anesthesia Plan Comments:         Anesthesia Quick Evaluation

## 2015-02-10 NOTE — Progress Notes (Addendum)
Page by RN due to hypoxemia. Pt had to be placed on 3L [place] postop with sat 90-91% on 3L Arrived at bedside at 615pm Pt resting comfortably without distress. Denies any CP, but c/o sob. No N/V Wife present at bedside  101.1--HR 100--RR18--145/73--91% on 3L  CV-RRR, no rub Lung--scattered rhonchi bilateral, poor inspiratory effort Abd, soft/NT+BS  STAT CXR ordered Blood cultures x 2 sets UA and urine culture ABG ordered Placed on telemetry EKG ordered  DTat  Total time for 02/10/15 65min, >50% spent counseling and coordinating care. DTat

## 2015-02-11 DIAGNOSIS — J9601 Acute respiratory failure with hypoxia: Secondary | ICD-10-CM

## 2015-02-11 DIAGNOSIS — S72001P Fracture of unspecified part of neck of right femur, subsequent encounter for closed fracture with malunion: Secondary | ICD-10-CM

## 2015-02-11 DIAGNOSIS — J189 Pneumonia, unspecified organism: Secondary | ICD-10-CM

## 2015-02-11 DIAGNOSIS — A419 Sepsis, unspecified organism: Secondary | ICD-10-CM

## 2015-02-11 LAB — URINE CULTURE: Colony Count: 80000

## 2015-02-11 LAB — CBC WITH DIFFERENTIAL/PLATELET
Basophils Absolute: 0 10*3/uL (ref 0.0–0.1)
Basophils Relative: 0 % (ref 0–1)
EOS PCT: 2 % (ref 0–5)
Eosinophils Absolute: 0.2 10*3/uL (ref 0.0–0.7)
HEMATOCRIT: 30.1 % — AB (ref 39.0–52.0)
Hemoglobin: 9.4 g/dL — ABNORMAL LOW (ref 13.0–17.0)
LYMPHS ABS: 0.8 10*3/uL (ref 0.7–4.0)
Lymphocytes Relative: 8 % — ABNORMAL LOW (ref 12–46)
MCH: 23.1 pg — ABNORMAL LOW (ref 26.0–34.0)
MCHC: 31.2 g/dL (ref 30.0–36.0)
MCV: 74 fL — ABNORMAL LOW (ref 78.0–100.0)
MONO ABS: 1.4 10*3/uL — AB (ref 0.1–1.0)
Monocytes Relative: 13 % — ABNORMAL HIGH (ref 3–12)
Neutro Abs: 8 10*3/uL — ABNORMAL HIGH (ref 1.7–7.7)
Neutrophils Relative %: 77 % (ref 43–77)
PLATELETS: UNDETERMINED 10*3/uL (ref 150–400)
RBC: 4.07 MIL/uL — ABNORMAL LOW (ref 4.22–5.81)
RDW: 13.4 % (ref 11.5–15.5)
WBC: 10.4 10*3/uL (ref 4.0–10.5)

## 2015-02-11 LAB — BASIC METABOLIC PANEL
Anion gap: 3 — ABNORMAL LOW (ref 5–15)
BUN: 16 mg/dL (ref 6–23)
CALCIUM: 7.7 mg/dL — AB (ref 8.4–10.5)
CO2: 27 mmol/L (ref 19–32)
Chloride: 104 mmol/L (ref 96–112)
Creatinine, Ser: 0.76 mg/dL (ref 0.50–1.35)
GFR calc Af Amer: >90 mL/min (ref >90)
GFR calc non Af Amer: 85 mL/min — ABNORMAL LOW (ref >90)
Glucose, Bld: 93 mg/dL (ref 70–99)
Potassium: 3.7 mmol/L (ref 3.5–5.1)
SODIUM: 134 mmol/L — AB (ref 135–145)

## 2015-02-11 LAB — LACTIC ACID, PLASMA
Lactic Acid, Venous: 0.9 mmol/L (ref 0.5–2.0)
Lactic Acid, Venous: 1.9 mmol/L (ref 0.5–2.0)

## 2015-02-11 LAB — FERRITIN: FERRITIN: 427 ng/mL — AB (ref 22–322)

## 2015-02-11 LAB — VITAMIN B12: Vitamin B-12: 614 pg/mL (ref 211–911)

## 2015-02-11 LAB — IRON AND TIBC
Iron: 17 ug/dL — ABNORMAL LOW (ref 42–165)
SATURATION RATIOS: 11 % — AB (ref 20–55)
TIBC: 160 ug/dL — AB (ref 215–435)
UIBC: 143 ug/dL (ref 125–400)

## 2015-02-11 LAB — APTT: APTT: 43 s — AB (ref 24–37)

## 2015-02-11 LAB — PROTIME-INR
INR: 1.28 (ref 0.00–1.49)
PROTHROMBIN TIME: 16.2 s — AB (ref 11.6–15.2)

## 2015-02-11 MED ORDER — SODIUM CHLORIDE 0.9 % IV SOLN
INTRAVENOUS | Status: DC
  Administered 2015-02-11 – 2015-02-12 (×3): via INTRAVENOUS

## 2015-02-11 MED ORDER — SENNA 8.6 MG PO TABS
2.0000 | ORAL_TABLET | Freq: Every day | ORAL | Status: DC
  Administered 2015-02-12 – 2015-02-13 (×2): 17.2 mg via ORAL
  Filled 2015-02-11 (×2): qty 2

## 2015-02-11 MED ORDER — SODIUM CHLORIDE 0.9 % IV BOLUS (SEPSIS)
1000.0000 mL | Freq: Once | INTRAVENOUS | Status: AC
  Administered 2015-02-11: 1000 mL via INTRAVENOUS

## 2015-02-11 MED ORDER — LACTULOSE 10 GM/15ML PO SOLN
20.0000 g | Freq: Once | ORAL | Status: AC
  Administered 2015-02-11: 20 g via ORAL
  Filled 2015-02-11: qty 30

## 2015-02-11 MED ORDER — SODIUM CHLORIDE 0.9 % IV BOLUS (SEPSIS)
500.0000 mL | Freq: Once | INTRAVENOUS | Status: AC
  Administered 2015-02-11: 500 mL via INTRAVENOUS

## 2015-02-11 MED ORDER — BISACODYL 10 MG RE SUPP
10.0000 mg | Freq: Every day | RECTAL | Status: DC | PRN
  Administered 2015-02-11: 10 mg via RECTAL
  Filled 2015-02-11: qty 1

## 2015-02-11 NOTE — Evaluation (Signed)
Clinical/Bedside Swallow Evaluation Patient Details  Name: [name] LagosJimmy L [name] MRN: [id number] Date of Birth: 11/26/1933  Today's Date: 02/11/2015 Time: SLP Start Time (ACUTE ONLY): 1550 SLP Stop Time (ACUTE ONLY): 1610 SLP Time Calculation (min) (ACUTE ONLY): 20 min  Past Medical History:  Past Medical History  Diagnosis Date  . Stroke   . Hypertension   . Enlarged prostate   . Inguinal hernia    Past Surgical History:  Past Surgical History  Procedure Laterality Date  . Brain surgery     HPI:  Pt is a [age] y.o. Male with h/o CVA with right sided hemiparesis admitted 02/09/15 after fall at home resulting in R femoral fx. Pt is now s/p R cannulated hip pinning on 02/10/15. CXR 4/2 post surgery: Interstitial accentuation in the right lung appears increased.Differential diagnostic considerations include asymmetric noncardiogenic edema, aspiration pneumonitis, pulmonary embolus, or early patchy right-sided bronchopneumonia. Pre-surgical CXR without acute abnormalities.    Assessment / Plan / Recommendation Clinical Impression  Swallow evaluation complete. Mild, inconsistent s/s of aspiration characterized by cough post swallow noted with thin liquid via straw only, eliminated with cueing for small cup sips. Patient consumes pureed solids at baseline due to poor dental condition. Patient and family deny difficulty swallowing at baseline. Question lethargy combined with brief pharyngeal irritation post surgery as cause for mild difficulty. Will f/u briefly for continued diagnostic treatment at bedside. Education complete with patient and family.     Aspiration Risk  Mild    Diet Recommendation Dysphagia 1 (Puree);Thin liquid   Liquid Administration via: Cup;No straw Medication Administration: Whole meds with liquid Supervision: Staff to assist with self feeding;Full supervision/cueing for compensatory strategies Compensations: Slow rate;Small sips/bites Postural Changes and/or Swallow  Maneuvers: Seated upright 90 degrees    Other  Recommendations Oral Care Recommendations: Oral care BID   Follow Up Recommendations   (TBD)    Frequency and Duration min 1 x/week  1 week   Pertinent Vitals/Pain n/a     Swallow Study Prior Functional Status  Type of Home: House Available Help at Discharge: Family;Available 24 hours/day    General HPI: Pt is a 81 y.o. Male with h/o CVA with right sided hemiparesis admitted 02/09/15 after fall at home resulting in R femoral fx. Pt is now s/p R cannulated hip pinning on 02/10/15. CXR 4/2 post surgery: Interstitial accentuation in the right lung appears increased.Differential diagnostic considerations include asymmetric noncardiogenic edema, aspiration pneumonitis, pulmonary embolus, or early patchy right-sided bronchopneumonia. Pre-surgical CXR without acute abnormalities.  Type of Study: Bedside swallow evaluation Previous Swallow Assessment: none Diet Prior to this Study: Thin liquids;Dysphagia 3 (soft) Temperature Spikes Noted: Yes Respiratory Status: Room air History of Recent Intubation: Yes Length of Intubations (days):  (surgery only) Behavior/Cognition: Distractible;Lethargic Oral Cavity - Dentition: Missing dentition;Poor condition Self-Feeding Abilities: Able to feed self;Needs assist Patient Positioning: Upright in bed Baseline Vocal Quality: Clear Volitional Cough: Strong Volitional Swallow: Able to elicit    Oral/Motor/Sensory Function Overall Oral Motor/Sensory Function: Appears within functional limits for tasks assessed   Ice Chips Ice chips: Not tested   Thin Liquid Thin Liquid: Impaired Presentation: Straw Pharyngeal  Phase Impairments: Cough - Delayed    Nectar Thick Nectar Thick Liquid: Not tested   Honey Thick Honey Thick Liquid: Not tested   Puree Puree: Within functional limits Presentation: Spoon   Solid   GO   Rosio Weiss MA, CCC-SLP [phone]  Solid: Impaired (immediate expectoration of bolus,  consumes puree at home)  Dannah Ryles Meryl 02/11/2015,4:12 PM

## 2015-02-11 NOTE — Progress Notes (Addendum)
PROGRESS NOTE  [name] ZOX:[id number] DOB: 05/29/1934 DOA: 02/09/2015 PCP: Lina Sayreimothy Lane, MD  Brief history 81 year old male with a history of hypertension and right hemiparesis secondary to stroke in 2000 and 2004. According to the patient's son, the patient has had a slow gradual functional and current decline since his strokes. At baseline, he is able to transfer and ambulate using a walker. Unfortunately, the patient had a mechanical fall when he missed his chair and attempt to sit down on 02/08/2015. Because of increasing pain and inability to bear weight on his right lower extremity, the patient presented to the emergency department. CT of the right hip revealed a subcapital right femoral neck fracture. Orthopedics was consulted, and patient underwent right hip pinning on 02/10/2015. The patient developed a fever and hypoxemia postoperatively on the evening of 02/10/2015. Pulmonary workup reveals right-sided pneumonia. The patient was started on intravenous imipenem and vancomycin after blood cultures were performed. Assessment/Plan: Right subcapital femoral neck fracture -appreciate ortho -02/10/15--R- hip pinning -pain control -PT-->recommends CIR--I have placed consult and discussed with patient and daughter-in-law at the bedside but I do not feel they understand how beneficial this may be -daughter in law and pt appear angry that pt has to sit up in chair as part of PT and increasing mobilization--I explained the longer he can tolerate, the better Sepsis/HCAP/acute respiratory hypoxemic failure -stable on 3L [place] -Continue IV imipenem and vanco pending culture data -BP remains soft but stable -continue IVF -Lactic Acid 2.8-->0.9 -pulmonary hygiene -flutter valve -Procalcitonin 0.10 Hypertension -hold metoprolol succinate as a patient's blood pressure is soft Thrombocytopenia -Check serum B12 BPH -continue avodart and flomax R-inguinal hernia -spoke with [facility]--not  feasible to perform hernia surgery at same time as orthopedic surgery History of stroke -continue ASA -residual R-hemiparesis R-foot pain and edema -xray R-foot and ankle--no fracture or dislocation Microcytic anemia -check iron studies--pending   Family Communication:Daughter-in-law updated at beside--I asked her if she would like me to call pt's son or wife to update and she stated "no" Disposition Plan: CIR when medically stable   Procedures/Studies: Dg Chest 1 View  02/09/2015   CLINICAL DATA:  Fall 3 days ago with chest pain, initial encounter  EXAM: CHEST  1 VIEW  COMPARISON:  12/12/2009  FINDINGS: Cardiac shadow is within normal limits. The thoracic aorta is tortuous and mildly calcified. Lungs are clear. No pneumothorax is noted. No acute bony abnormality is seen.  IMPRESSION: No acute abnormality noted.   Electronically Signed   By: Alcide CleverMark  Lukens M.D.   On: 02/09/2015 19:00   Dg Ankle 2 Views Right  02/10/2015   CLINICAL DATA:  Fall with pain and edema. Pain aggravated by walking.  EXAM: RIGHT ANKLE - 2 VIEW  COMPARISON:  Right foot 02/10/2015  FINDINGS: Lateral soft tissue swelling. No evidence for fracture or dislocation. Normal alignment of the ankle.  IMPRESSION: No acute bone abnormality in the ankle.  Evidence for lateral soft tissue swelling.   Electronically Signed   By: Richarda OverlieAdam  Henn M.D.   On: 02/10/2015 14:28   Ct Head Wo Contrast  02/09/2015   CLINICAL DATA:  Fall 2 days ago.  CVA with right-sided weakness.  EXAM: CT HEAD WITHOUT CONTRAST  TECHNIQUE: Contiguous axial images were obtained from the base of the skull through the vertex without intravenous contrast.  COMPARISON:  03/22/2009  FINDINGS: Sinuses/Soft tissues: Mucosal thickening of left maxillary sinus. Clear mastoid air cells. Occipital craniotomy.  Intracranial: Generalized atrophy. Moderate low density in the periventricular white matter likely related to small vessel disease. Slightly increased. Cerebellar atrophy  with encephalomalacia in the medial right cerebellar hemisphere, as before. Dolichoectasia of the right vertebral artery. No mass lesion, hemorrhage, hydrocephalus, acute infarct, intra-axial, or extra-axial fluid collection.  IMPRESSION: 1.  No acute intracranial abnormality. 2. Cerebral/cerebellar atrophy with progressive small vessel ischemic change. 3. Sinus disease and prior craniotomy.   Electronically Signed   By: Jeronimo Greaves M.D.   On: 02/09/2015 18:37   Pelvis Portable  02/10/2015   CLINICAL DATA:  Hip fracture.  EXAM: PORTABLE PELVIS 1-2 VIEWS  COMPARISON:  Yesterday's CT.  FINDINGS: Screw fixation of the previously described right femoral neck fracture. Suboptimal patient positioning, with right upper extremity projecting over the upper pelvis. Patient is also obliqued. No acute hardware complication or superimposed acute fracture identified.  IMPRESSION: Expected appearance after right proximal femoral fixation.   Electronically Signed   By: Jeronimo Greaves M.D.   On: 02/10/2015 13:55   Ct Hip Right Wo Contrast  02/09/2015   CLINICAL DATA:  Fall, right hip pain  EXAM: CT OF THE RIGHT HIP WITHOUT CONTRAST  TECHNIQUE: Multidetector CT imaging of the right hip was performed according to the standard protocol. Multiplanar CT image reconstructions were also generated.  COMPARISON:  Right hip radiographs dated 02/09/2015  FINDINGS: Subcapital right femoral neck fracture (series 6/image 15), impacted/foreshortened (series 6/image 20).  Visualized bony pelvis appears intact.  Right inguinal/scrotal hernia containing fat and small bowel (series 3/ image 13), incompletely visualized.  Foley catheter in the bladder.  Moderate stool in the rectum.  IMPRESSION: Subcapital right femoral neck fracture, impacted/foreshortened.   Electronically Signed   By: Charline Bills M.D.   On: 02/09/2015 22:03   Dg Chest Port 1 View  02/10/2015   CLINICAL DATA:  Hypoxia. Cannulated hip pinning earlier today. Abnormal lung  sounds.  EXAM: PORTABLE CHEST - 1 VIEW  COMPARISON:  02/09/2015  FINDINGS: Tortuous and atherosclerotic thoracic aorta. Heart size within normal limits.  Interstitial accentuation in the right lung, especially the right lung apex, increased from prior. The left lung looks clear. No pleural effusion identified.  IMPRESSION: 1. Interstitial accentuation in the right lung appears increased. Differential diagnostic considerations include asymmetric noncardiogenic edema, aspiration pneumonitis, pulmonary embolus, or early patchy right-sided bronchopneumonia. 2. Tortuous and atherosclerotic thoracic aorta.   Electronically Signed   By: Gaylyn Rong M.D.   On: 02/10/2015 19:03   Dg Foot 2 Views Right  02/10/2015   CLINICAL DATA:  Mechanical fall with foot pain and edema.  EXAM: RIGHT FOOT - 2 VIEW  COMPARISON:  None.  FINDINGS: AP and lateral views portably. Mild osteopenia. No acute fracture or dislocation. Small calcaneal spur. Degenerative changes of the tibiotalar joint and first metatarsal phalangeal joint.  IMPRESSION: No acute osseous abnormality.  Limited two-view portable technique.   Electronically Signed   By: Jeronimo Greaves M.D.   On: 02/10/2015 14:37   Dg Hip Operative Unilat With Pelvis Right  02/10/2015   CLINICAL DATA:  ORIF of a right femoral neck fracture.  EXAM: OPERATIVE RIGHT HIP (WITH PELVIS IF PERFORMED)  VIEWS  TECHNIQUE: Fluoroscopic spot image(s) were submitted for interpretation post-operatively.  COMPARISON:  CT, 02/09/2015  FINDINGS: Subcapital fracture has been stabilized with 3 screws. There is no fracture comminution or significant displacement. There is slight residual valgus angulation.  There is no new fracture or evidence of an operative complication.  IMPRESSION: ORIF of  a right subcapital femoral neck fracture.   Electronically Signed   By: Amie Portland M.D.   On: 02/10/2015 11:38   Dg Hip Unilat With Pelvis Min 4 Views Right  02/09/2015   CLINICAL DATA:  Fall 3 days ago  with right hip pain, initial encounter  EXAM: RIGHT HIP (WITH PELVIS) 4+ VIEWS  COMPARISON:  None.  FINDINGS: Pelvic ring is intact. The proximal left femur is within normal limits. Only limited evaluation of the proximal right femur is available due to the patient's inability to adequately position himself. There is some regularity of the femoral neck superimposed over the femoral head. This is suspicious for subcapital femoral neck fracture. CT may be helpful for further evaluation.  IMPRESSION: Changes suspicious for a subcapital femoral neck fracture. CT may be helpful for further evaluation given difficulty positioning the patient.   Electronically Signed   By: Alcide Clever M.D.   On: 02/09/2015 19:11   Dg Femur, Min 2 Views Right  02/09/2015   CLINICAL DATA:  Fall 3 days ago with right hip and leg pain  EXAM: RIGHT FEMUR 2 VIEWS  COMPARISON:  None.  FINDINGS: Irregularity of the proximal femur is again identified suspicious for subcapital femoral neck fracture. The distal femur is within normal limits. The knee joint is unremarkable.  IMPRESSION: Changes suspicious for proximal right femoral fracture   Electronically Signed   By: Alcide Clever M.D.   On: 02/09/2015 19:12         Subjective: Patient is breathing better. He denies any chest pain, shortness breath, nausea, vomiting, diarrhea, abdominal pain. He complains of right shoulder pain has been chronic. No headaches.  Objective: Filed Vitals:   02/11/15 0000 02/11/15 0035 02/11/15 0400 02/11/15 0631  BP:  90/50  89/49  Pulse:  73  62  Temp:  98 F (36.7 C)  98.2 F (36.8 C)  TempSrc:  Oral  Oral  Resp: 16 16 16 16   Weight:    60.6 kg (133 lb 9.6 oz)  SpO2: 93% 93% 93% 96%    Intake/Output Summary (Last 24 hours) at 02/11/15 1234 Last data filed at 02/11/15 0700  Gross per 24 hour  Intake    510 ml  Output   1300 ml  Net   -790 ml   Weight change:  Exam:   General:  Pt is alert, follows commands appropriately, not in  acute distress  HEENT: No icterus, No thrush,  [place]/AT  Cardiovascular: RRR, S1/S2, no rubs, no gallops  Respiratory: Diminished breath sounds right base. Right basilar crackles. No wheezes.  Abdomen: Soft/+BS, non tender, non distended, no guarding  Extremities: No edema, No lymphangitis, No petechiae, No rashes, no synovitis  Data Reviewed: Basic Metabolic Panel:  Recent Labs Lab 02/09/15 1807 02/10/15 0458 02/11/15 0720  NA 138 137 134*  K 4.6 4.1 3.7  CL 103 106 104  CO2 26 30 27   GLUCOSE 124* 112* 93  BUN 26* 18 16  CREATININE 0.83 0.66 0.76  CALCIUM 8.9 8.4 7.7*  MG  --  2.1  --    Liver Function Tests:  Recent Labs Lab 02/09/15 1900 02/10/15 0458  AST 23 22  ALT 14 13  ALKPHOS 43 41  BILITOT 0.6 0.8  PROT 7.2 6.8  ALBUMIN 3.8 3.5   No results for input(s): LIPASE, AMYLASE in the last 168 hours. No results for input(s): AMMONIA in the last 168 hours. CBC:  Recent Labs Lab 02/09/15 1807 02/10/15 0458 02/11/15 0720  WBC 9.4 10.0 10.4  NEUTROABS 7.6 7.2 8.0*  HGB 11.9* 11.4* 9.4*  HCT 38.1* 36.7* 30.1*  MCV 74.0* 74.1* 74.0*  PLT 127* PLATELET CLUMPS NOTED ON SMEAR, COUNT APPEARS DECREASED PLATELET CLUMPS NOTED ON SMEAR, UNABLE TO ESTIMATE   Cardiac Enzymes:  Recent Labs Lab 02/09/15 1807  TROPONINI <0.03   BNP: Invalid input(s): POCBNP CBG: No results for input(s): GLUCAP in the last 168 hours.  Recent Results (from the past 240 hour(s))  Urine culture     Status: None   Collection Time: 02/09/15  7:22 PM  Result Value Ref Range Status   Specimen Description URINE, RANDOM  Final   Special Requests NONE  Final   Colony Count   Final    80,000 COLONIES/ML Performed at [facility]    Culture   Final    Multiple bacterial morphotypes present, none predominant. Suggest appropriate recollection if clinically indicated. Performed at [facility]    Report Status 02/11/2015 FINAL  Final  Surgical pcr screen     Status:  None   Collection Time: 02/10/15  6:07 AM  Result Value Ref Range Status   MRSA, PCR NEGATIVE NEGATIVE Final   Staphylococcus aureus NEGATIVE NEGATIVE Final    Comment:        The Xpert SA Assay (FDA approved for NASAL specimens in patients over 71 years of age), is one component of a comprehensive surveillance program.  Test performance has been validated by [facility] for patients greater than or equal to 41 year old. It is not intended to diagnose infection nor to guide or monitor treatment.      Scheduled Meds: . sodium chloride   Intravenous Once  . aspirin EC  325 mg Oral Daily  . docusate sodium  100 mg Oral BID  . dutasteride  0.5 mg Oral Daily   And  . tamsulosin  0.4 mg Oral Daily  . imipenem-cilastatin  500 mg Intravenous 3 times per day  . vancomycin  500 mg Intravenous Q12H   Continuous Infusions: . sodium chloride 100 mL/hr at 02/11/15 0849     Tekeshia Klahr, DO  [facility] Pager [phone]  If 7PM-7AM, please contact night-coverage [email or web address] Password [email or web address] 02/11/2015, 12:34 PM   LOS: 2 days

## 2015-02-11 NOTE — Progress Notes (Signed)
     Subjective:  POD#1 right hip pinning.  Patient reports pain as moderate.  Resting comfortably in bed.  Mild hypoxemia overnight and started on O2.  Worked with PT this morning. Wife at bedside throughout the night.  Insistent that the husband will go home with her once stable.  Objective:   VITALS:   Filed Vitals:   02/11/15 0000 02/11/15 0035 02/11/15 0400 02/11/15 0631  BP:  90/50  89/49  Pulse:  73  62  Temp:  98 F (36.7 C)  98.2 F (36.8 C)  TempSrc:  Oral  Oral  Resp: 16 16 16 16   Weight:    60.6 kg (133 lb 9.6 oz)  SpO2: 93% 93% 93% 96%    Neurologically intact ABD soft Neurovascular intact Sensation intact distally Intact pulses distally Dorsiflexion/Plantar flexion intact Incision: dressing C/D/I   Lab Results  Component Value Date   WBC 10.4 02/11/2015   HGB 9.4* 02/11/2015   HCT 30.1* 02/11/2015   MCV 74.0* 02/11/2015   PLT PLATELET CLUMPS NOTED ON SMEAR, UNABLE TO ESTIMATE 02/11/2015   BMET    Component Value Date/Time   NA 134* 02/11/2015 0720   K 3.7 02/11/2015 0720   CL 104 02/11/2015 0720   CO2 27 02/11/2015 0720   GLUCOSE 93 02/11/2015 0720   BUN 16 02/11/2015 0720   CREATININE 0.76 02/11/2015 0720   CALCIUM 7.7* 02/11/2015 0720   GFRNONAA 85* 02/11/2015 0720   GFRAA >90 02/11/2015 0720     Assessment/Plan: 1 Day Post-Op   Principal Problem:   Fracture of femoral neck, right Active Problems:   Tuberculosis of lung, infiltrative   Essential hypertension   Hemiparesis affecting right side as late effect of cerebrovascular accident   OVERACTIVE BLADDER   Anorexia   Inguinal hernia   Closed right hip fracture   Fractured hip   Cerebral infarction due to other mechanism   Microcytic anemia   Thrombocytopenia   Up with therapy WBAT in the right hip ASA 325mg  daily for 30 days post op for DVT prophylaxis  Lyrica Mcclarty Marie 02/11/2015, 12:02 PM Cell [phone]

## 2015-02-11 NOTE — Evaluation (Signed)
Physical Therapy Evaluation Patient Details Name: [name] MRN: [id number] DOB: 01/02/1934 Today's Date: 02/11/2015   History of Present Illness  Admitted for R hip pain for 2 days after fall while apparently preforming a bathroom transfer; now s/p IM Nail; WBAT; history significant for R sided hemiparesis Past Medical History  Diagnosis Date  . Stroke   . Hypertension   . Enlarged prostate   . Inguinal hernia    Past Surgical History  Procedure Laterality Date  . Brain surgery       Clinical Impression  Patient is s/p above surgery resulting in functional limitations due to the deficits listed below (see PT Problem List).  Patient will benefit from skilled PT to increase their independence and safety with mobility to allow discharge to the venue listed below.    Pt's wife clearly indicated she would like him to go home (rehab at SNF is not desirable for her or the patient); He is very motivated to move better, and I believe he would make significant progress at CIR. Have requested screen.     Follow Up Recommendations CIR    Equipment Recommendations   (to be determined)    Recommendations for Other Services Rehab consult     Precautions / Restrictions Precautions Precautions: Fall Precaution Comments: hx Right hemiparesis Restrictions Weight Bearing Restrictions: Yes RLE Weight Bearing: Weight bearing as tolerated      Mobility  Bed Mobility Overal bed mobility: Needs Assistance Bed Mobility: Supine to Sit     Supine to sit: +2 for physical assistance;Mod assist     General bed mobility comments: verbal and tactile cues for technqiue; required assist to elevat trunk and used bed pad to square off hip sat EOB  Transfers Overall transfer level: Needs assistance Equipment used: 1 person hand held assist;2 person hand held assist;Rolling walker (2 wheeled) Transfers: Sit to/from [employer]GI CorporationStand;Stand Pivot Transfers Sit to Stand: Mod assist;+2  safety/equipment Stand pivot transfers: Mod assist;+2 safety/equipment       General transfer comment: Cues for technique, hand palcement and safety; overall good initiationa nd rise on LLE; max verbal and hand over hand cues to reach for far armrest during pivot transfer; assist to guide hips to chair; Good rise with dit to stand to RW; assist to place RUE on RW, but noted good hand opening and reaching for grip; at least mod assist to ease descent to chair  Ambulation/Gait Ambulation/Gait assistance: +2 physical assistance;Mod assist Ambulation Distance (Feet): 5 Feet Assistive device: Rolling walker (2 wheeled) Gait Pattern/deviations: Step-to pattern;Decreased step length - right;Decreased step length - left;Trunk flexed;Antalgic     General Gait Details: Pt very motivated and wanting to try walking; step-by-step cues for gait sequence; mod verbal and tactile cueing for upright posture and to work on extend both snees fully straight in standing; extremely short steps with very small base of support; needing extra support at gait belt while advancing LLE  Stairs            Wheelchair Mobility    Modified Rankin (Stroke Patients Only)       Balance Overall balance assessment: Needs assistance   Sitting balance-Leahy Scale: Fair (Poor initially) Sitting balance - Comments: initially needing support at trunk, but able to organize and not need support     Standing balance-Leahy Scale: Poor  Pertinent Vitals/Pain Pain Assessment: Faces Faces Pain Scale: Hurts even more Pain Location: R hip, with transfers or standing Pain Descriptors / Indicators: Aching;Grimacing Pain Intervention(s): Limited activity within patient's tolerance;Monitored during session;Repositioned    Home Living Family/patient expects to be discharged to:: Private residence Living Arrangements: Spouse/significant other Available Help at Discharge:  Family;Available 24 hours/day Type of Home: House Home Access: Other (comment) (small step? mostly level)     Home Layout: One level Home Equipment: Wheelchair - power Additional Comments: Has AE for bathroom; not sure if shower seat or bench    Prior Function Level of Independence: Needs assistance   Gait / Transfers Assistance Needed: mostly transfers to his power wc; reports was able to take a few steps and sometimes would walk in room distances (not sure if with RW or not)  ADL's / Homemaking Assistance Needed: wife assist        Hand Dominance        Extremity/Trunk Assessment   Upper Extremity Assessment: Defer to OT evaluation           Lower Extremity Assessment: RLE deficits/detail RLE Deficits / Details: Decr AROM and strength, limited by pain postop; also with history of R hemiparesis, and noted tendency to hold hip in grossly flexed; difficulty fully extending hip, but able to actively move in all plnaes       Communication   Communication: Other (comment) (Vietmanese)  Cognition Arousal/Alertness: Awake/alert Behavior During Therapy: WFL for tasks assessed/performed Overall Cognitive Status: Within Functional Limits for tasks assessed (Seems very disappointed in the difficulty with moving)                      General Comments General comments (skin integrity, edema, etc.): O2 dropped to 87 on RA supine in bed when [place] removed. Returned pt to 3 L  and O2 bumped to 92%.     Exercises General Exercises - Lower Extremity Quad Sets: AROM;Right;5 reps Heel Slides: AROM;Right;5 reps Hip ABduction/ADduction: AROM;Right;5 reps      Assessment/Plan    PT Assessment Patient needs continued PT services  PT Diagnosis Difficulty walking;Acute pain;Abnormality of gait   PT Problem List Decreased strength;Decreased range of motion;Decreased activity tolerance;Decreased balance;Decreased mobility;Decreased coordination;Decreased knowledge of use of  DME;Decreased safety awareness;Decreased knowledge of precautions;Pain;Cardiopulmonary status limiting activity  PT Treatment Interventions DME instruction;Gait training;Functional mobility training;Therapeutic activities;Therapeutic exercise;Balance training;Patient/family education;Wheelchair mobility training   PT Goals (Current goals can be found in the Care Plan section) Acute Rehab PT Goals Patient Stated Goal: He wants to be back to his PLOF; Wife VERY MUCH wants to take him home PT Goal Formulation: With patient/family Time For Goal Achievement: 02/25/15 Potential to Achieve Goals: Good    Frequency Min 6X/week   Barriers to discharge        Co-evaluation PT/OT/SLP Co-Evaluation/Treatment: Yes Reason for Co-Treatment: Complexity of the patient's impairments (multi-system involvement);For patient/therapist safety PT goals addressed during session: Mobility/safety with mobility         End of Session Equipment Utilized During Treatment: Gait belt Activity Tolerance: Patient tolerated treatment well Patient left: in chair;with call bell/phone within reach (Extensive education that being OOB in chair benefits pt) Nurse Communication: Mobility status;Weight bearing status (Wife very anxious about pt status)         Time: 2841-3244 PT Time Calculation (min) (ACUTE ONLY): 37 min   Charges:   PT Evaluation $Initial PT Evaluation Tier I: 1 Procedure     PT G Codes:  Van Clines Hamff 02/11/2015, 11:02 AM  Van Clines, PT  Acute Rehabilitation Services Pager [phone] Office [phone]

## 2015-02-11 NOTE — Evaluation (Signed)
Occupational Therapy Evaluation Patient Details Name: [name] MRN: [id number] DOB: 1934-02-27 Today's Date: 02/11/2015    History of Present Illness Pt is a 81 y.o. Male admitted 02/09/15 after fall at home resulting in R femoral fx. Pt is now s/p R cannulated hip pinning on 02/10/15.    Clinical Impression   PTA pt lived at home with his wife and was independent with transfers to/from powerchair. Pt was able to ambulate very short distances. Per his wife, he was independent with ADLs and had supervision for shower transfer. Pt is limited by RLE pain and decreased ROM which impairs his functional mobility and ADLs. Pt would be excellent candidate due to his desire for independence and strong family support and will benefit from acute OT to progress to Supervision level to return home with family support.     Follow Up Recommendations  CIR;Supervision/Assistance - 24 hour    Equipment Recommendations  None recommended by OT    Recommendations for Other Services       Precautions / Restrictions Precautions Precautions: Fall Precaution Comments: hx Right hemiparesis Restrictions Weight Bearing Restrictions: Yes RLE Weight Bearing: Weight bearing as tolerated      Mobility Bed Mobility Overal bed mobility: Needs Assistance Bed Mobility: Supine to Sit     Supine to sit: +2 for physical assistance;Mod assist     General bed mobility comments: verbal and tactile cues for technqiue; required assist to elevat trunk and used bed pad to square off hip sat EOB  Transfers Overall transfer level: Needs assistance Equipment used: 1 person hand held assist;2 person hand held assist;Rolling walker (2 wheeled) Transfers: Sit to/from [employer] Sit to Stand: Mod assist;+2 safety/equipment Stand pivot transfers: Mod assist;+2 safety/equipment       General transfer comment: Cues for technique, hand palcement and safety; overall good initiationa nd rise on LLE; max  verbal and hand over hand cues to reach for far armrest during pivot transfer; assist to guide hips to chair; Good rise with dit to stand to RW; assist to place RUE on RW, but noted good hand opening and reaching for grip; at least mod assist to ease descent to chair    Balance Overall balance assessment: Needs assistance   Sitting balance-Leahy Scale: Fair Sitting balance - Comments: initially needing support at trunk, but able to organize and not need support   Standing balance support: Bilateral upper extremity supported;During functional activity Standing balance-Leahy Scale: Poor                              ADL Overall ADL's : Needs assistance/impaired Eating/Feeding: Set up;Sitting   Grooming: Set up;Sitting   Upper Body Bathing: Set up;Sitting (increased time)   Lower Body Bathing: Moderate assistance;+2 for physical assistance;Sit to/from stand   Upper Body Dressing : Minimal assistance;Sitting   Lower Body Dressing: Maximal assistance;+2 for physical assistance;Sit to/from stand   Toilet Transfer: Moderate assistance;+2 for safety/equipment;Stand-pivot;RW             General ADL Comments: Pt seemed disappointed in his limited mobility.      Vision Vision Assessment?: No apparent visual deficits          Pertinent Vitals/Pain Pain Assessment: Faces Faces Pain Scale: Hurts even more Pain Location: R hip with transfers or standing Pain Descriptors / Indicators: Aching;Grimacing Pain Intervention(s): Limited activity within patient's tolerance;Monitored during session;Repositioned        Extremity/Trunk Assessment Upper  Extremity Assessment Upper Extremity Assessment: RUE deficits/detail;Generalized weakness RUE Deficits / Details: hx of Right hemiparesis. RUE with some slow movement and able to reach to hold RW with assistance.    Lower Extremity Assessment Lower Extremity Assessment: Defer to PT evaluation   Cervical / Trunk  Assessment Cervical / Trunk Assessment: Kyphotic   Communication Communication Communication: Other (comment) (Vietnamese; wife interpreted at times)   Cognition Arousal/Alertness: Awake/alert Behavior During Therapy: WFL for tasks assessed/performed Overall Cognitive Status: Within Functional Limits for tasks assessed                                Home Living Family/patient expects to be discharged to:: Private residence Living Arrangements: Spouse/significant other Available Help at Discharge: Family;Available 24 hours/day Type of Home: House Home Access: Other (comment) (small step? mostly level)     Home Layout: One level     Bathroom Shower/Tub: Tub/shower unit Shower/tub characteristics: Engineer, building servicesCurtain Bathroom Toilet: Standard Bathroom Accessibility: Yes How Accessible: Accessible via wheelchair;Other (comment) (pt's power chair fits in bathroom) Home Equipment: Wheelchair - power;Tub bench;Walker - 2 wheels;Bedside commode          Prior Functioning/Environment Level of Independence: Needs assistance  Gait / Transfers Assistance Needed: mostly transfers to his power wc; reports was able to take a few steps and sometimes would walk in room distances (not sure if with RW or not) ADL's / Homemaking Assistance Needed: pt is independent with bathing and dressing at baseline with wife supervising shower transfer Communication / Swallowing Assistance Needed: Understands English, at times benefitted from wife interpreting [place]      OT Diagnosis: Generalized weakness;Acute pain;Hemiplegia dominant side   OT Problem List: Decreased strength;Decreased range of motion;Decreased activity tolerance;Impaired balance (sitting and/or standing);Impaired UE functional use;Pain   OT Treatment/Interventions: Self-care/ADL training;Therapeutic exercise;Neuromuscular education;DME and/or AE instruction;Therapeutic activities;Energy conservation;Patient/family  education;Balance training    OT Goals(Current goals can be found in the care plan section) Acute Rehab OT Goals Patient Stated Goal: He wants to be back to his PLOF; Wife VERY MUCH wants to take him home OT Goal Formulation: With patient/family Time For Goal Achievement: 02/25/15 Potential to Achieve Goals: Good ADL Goals Pt Will Perform Lower Body Bathing: with min assist;sit to/from stand Pt Will Perform Lower Body Dressing: with min assist;sit to/from stand Pt Will Transfer to Toilet: with min assist;ambulating Pt Will Perform Toileting - Clothing Manipulation and hygiene: with min assist;sit to/from stand  OT Frequency: Min 2X/week           Co-evaluation PT/OT/SLP Co-Evaluation/Treatment: Yes Reason for Co-Treatment: Complexity of the patient's impairments (multi-system involvement);For patient/therapist safety;Other (comment) (for communication)   OT goals addressed during session: ADL's and self-care      End of Session Equipment Utilized During Treatment: Gait belt;Rolling walker Nurse Communication: Mobility status  Activity Tolerance: Patient tolerated treatment well Patient left: in chair;with call bell/phone within reach;with family/visitor present   Time: [id number] OT Time Calculation (min): 41 min Charges:  OT General Charges $OT Visit: 1 Procedure OT Evaluation $Initial OT Evaluation Tier I: 1 Procedure G-Codes:    Rae LipsMiller, Westen Dinino M 02/11/2015, 2:26 PM  Carney LivingLeeAnn Marie Kastin Cerda, OTR/L Occupational Therapist [phone] (pager)

## 2015-02-12 ENCOUNTER — Encounter (HOSPITAL_COMMUNITY): Payer: Self-pay | Admitting: Orthopedic Surgery

## 2015-02-12 DIAGNOSIS — S72001G Fracture of unspecified part of neck of right femur, subsequent encounter for closed fracture with delayed healing: Secondary | ICD-10-CM

## 2015-02-12 DIAGNOSIS — D696 Thrombocytopenia, unspecified: Secondary | ICD-10-CM

## 2015-02-12 DIAGNOSIS — S72001S Fracture of unspecified part of neck of right femur, sequela: Secondary | ICD-10-CM

## 2015-02-12 LAB — BASIC METABOLIC PANEL
Anion gap: 5 (ref 5–15)
BUN: 10 mg/dL (ref 6–23)
CO2: 25 mmol/L (ref 19–32)
Calcium: 7.7 mg/dL — ABNORMAL LOW (ref 8.4–10.5)
Chloride: 108 mmol/L (ref 96–112)
Creatinine, Ser: 0.64 mg/dL (ref 0.50–1.35)
GFR calc Af Amer: >90 mL/min (ref >90)
Glucose, Bld: 93 mg/dL (ref 70–99)
Potassium: 3.5 mmol/L (ref 3.5–5.1)
SODIUM: 138 mmol/L (ref 135–145)

## 2015-02-12 LAB — CBC
HEMATOCRIT: 28.2 % — AB (ref 39.0–52.0)
HEMOGLOBIN: 8.8 g/dL — AB (ref 13.0–17.0)
MCH: 22.8 pg — ABNORMAL LOW (ref 26.0–34.0)
MCHC: 31.2 g/dL (ref 30.0–36.0)
MCV: 73.1 fL — AB (ref 78.0–100.0)
Platelets: 96 10*3/uL — ABNORMAL LOW (ref 150–400)
RBC: 3.86 MIL/uL — ABNORMAL LOW (ref 4.22–5.81)
RDW: 13.3 % (ref 11.5–15.5)
WBC: 9.6 10*3/uL (ref 4.0–10.5)

## 2015-02-12 LAB — PROCALCITONIN: Procalcitonin: 0.42 ng/mL

## 2015-02-12 LAB — FOLATE RBC
FOLATE, HEMOLYSATE: 319 ng/mL
Folate, RBC: 1022 ng/mL (ref >498)
Hematocrit: 31.2 % — ABNORMAL LOW (ref 37.5–51.0)

## 2015-02-12 NOTE — Care Management Note (Signed)
CARE MANAGEMENT NOTE 02/12/2015  Patient:  [name],[name]   Account Number:  [id number]  Date Initiated:  02/12/2015  Documentation initiated by:  Vance PeperBRADY,Octave Montrose  Subjective/Objective Assessment:   81 yr old male admitted with right femoral fracture, patient underwent a right hip IM Nailing.     Action/Plan:   Patient will need shortterm rehab. Family interested in CIR. patient being evaluated. CM will follow.   Anticipated DC Date:     Anticipated DC Plan:  IP REHAB FACILITY  In-house referral  Clinical Social Worker      DC Planning Services  CM consult      Choice offered to / List presented to:             Status of service:  In process, will continue to follow Medicare Important Message given?   (If response is "NO", the following Medicare IM given date fields will be blank) Date Medicare IM given:   Medicare IM given by:   Date Additional Medicare IM given:   Additional Medicare IM given by:    Discharge Disposition:    Per UR Regulation:  Reviewed for med. necessity/level of care/duration of stay  If discussed at Long Length of Stay Meetings, dates discussed:    Comments:

## 2015-02-12 NOTE — Progress Notes (Signed)
Speech Language Pathology Treatment: Dysphagia  Patient Details Name: [name] MRN: [id number] DOB: Apr 25, 1934 Today's Date: 02/12/2015 Time: 6728-9791 SLP Time Calculation (min) (ACUTE ONLY): 13 min  Assessment / Plan / Recommendation Clinical Impression  Pt alert and without overt signs of dysphagia or aspiration across PO trials today, including advanced trials of thin liquids via straw. Pt's wife says that at home he does eat small pieces of soft foods, which do require some mastication and today he has been eating papaya. Will advance diet to Dys 2, continuing thin liquids. No further acute SLP needs identified.   HPI HPI: Pt is a 81 y.o. Male with h/o CVA with right sided hemiparesis admitted 02/09/15 after fall at home resulting in R femoral fx. Pt is now s/p R cannulated hip pinning on 02/10/15. CXR 4/2 post surgery: Interstitial accentuation in the right lung appears increased.Differential diagnostic considerations include asymmetric noncardiogenic edema, aspiration pneumonitis, pulmonary embolus, or early patchy right-sided bronchopneumonia. Pre-surgical CXR without acute abnormalities.    Pertinent Vitals Pain Assessment: Faces Faces Pain Scale: No hurt  SLP Plan  All goals met    Recommendations Diet recommendations: Dysphagia 2 (fine chop);Thin liquid Liquids provided via: Cup;Straw Medication Administration: Whole meds with liquid Supervision: Staff to assist with self feeding;Full supervision/cueing for compensatory strategies Compensations: Slow rate;Small sips/bites Postural Changes and/or Swallow Maneuvers: Seated upright 90 degrees       Oral Care Recommendations: Oral care BID Follow up Recommendations: None Plan: All goals met    Germain Osgood, M.A. CCC-SLP [phone]  Germain Osgood 02/12/2015, 3:07 PM

## 2015-02-12 NOTE — Progress Notes (Signed)
Physical Therapy Treatment Patient Details Name: [name] MRN: [id number] DOB: 11/19/1933 Today's Date: 02/12/2015    History of Present Illness Pt is a 81 y.o. Male admitted 02/09/15 after fall at home resulting in R femoral fx. Pt is now s/p R cannulated hip pinning on 02/10/15.     PT Comments    Pt ambulated 2 ft forward and 2 ft back at bedside w/ mod assist for bed mobility, sit<>stand, and ambulation.  Pt demonstrated posterior lean during session and pt was unable to stand upright despite verbal cues.  PT is continuing to recommend CIR as pt is motivated to participate in therapy and is progressing well.   Follow Up Recommendations  CIR     Equipment Recommendations   (to be determined)    Recommendations for Other Services       Precautions / Restrictions Precautions Precautions: Fall Precaution Comments: hx Right hemiparesis Restrictions Weight Bearing Restrictions: Yes RLE Weight Bearing: Weight bearing as tolerated    Mobility  Bed Mobility Overal bed mobility: Needs Assistance Bed Mobility: Supine to Sit;Sit to Supine     Supine to sit: +2 for physical assistance;Mod assist Sit to supine: +2 for physical assistance;Mod assist   General bed mobility comments: verbal and tactile cues for technique.  Required assist w/ trunk posteriorly and w/ bringing BLEs into and OOB.    Transfers Overall transfer level: Needs assistance Equipment used: Rolling walker (2 wheeled) Transfers: Sit to/from Stand Sit to Stand: Mod assist;+2 physical assistance         General transfer comment: Cues for hand placement on bed and RW.  Assist to stand upright w/ pt leaning posteriorly w/ slight loss of balance.  Ambulation/Gait Ambulation/Gait assistance: +2 physical assistance;Mod assist Ambulation Distance (Feet): 4 Feet Assistive device: Rolling walker (2 wheeled) Gait Pattern/deviations: Step-to pattern;Decreased stride length;Antalgic;Shuffle;Leaning  posteriorly;Trunk flexed;Narrow base of support   Gait velocity interpretation: Below normal speed for age/gender General Gait Details: Pt's B knees and hips remained in flexion despite verbal cues to stand upright.  Pt required assist w/ trunk posteriorly to maintain balance despite having BUEs on RW.  Verbal cues for proper sequencing.  Pt's trunk flexed w/ narrow base of support.  Pt required increased assist +2 when shifting weight to RLE to advance LLE likely 2/2 h/o R hemi.   Stairs            Wheelchair Mobility    Modified Rankin (Stroke Patients Only)       Balance Overall balance assessment: Needs assistance Sitting-balance support: Single extremity supported;Feet supported Sitting balance-Leahy Scale: Fair Sitting balance - Comments: initially needing support at trunk, but able to organize and not need support Postural control: Posterior lean Standing balance support: Bilateral upper extremity supported Standing balance-Leahy Scale: Poor                      Cognition Arousal/Alertness: Awake/alert Behavior During Therapy: WFL for tasks assessed/performed Overall Cognitive Status: Within Functional Limits for tasks assessed                      Exercises Total Joint Exercises Ankle Circles/Pumps: AROM;Both;10 reps;Supine Quad Sets: AROM;Both;5 reps;Supine Hip ABduction/ADduction: AAROM;Right;5 reps;Supine    General Comments        Pertinent Vitals/Pain Pain Assessment: Faces Faces Pain Scale: Hurts a little bit Pain Location: Pt did not specify Pain Descriptors / Indicators: Grimacing Pain Intervention(s): Monitored during session;Limited activity within patient's tolerance;Repositioned  Home Living                      Prior Function            PT Goals (current goals can now be found in the care plan section) Acute Rehab PT Goals Patient Stated Goal: He wants to be back to his PLOF; Wife VERY MUCH wants to take him  home PT Goal Formulation: With patient/family Time For Goal Achievement: 02/25/15 Potential to Achieve Goals: Good Progress towards PT goals: Progressing toward goals    Frequency  Min 6X/week    PT Plan Current plan remains appropriate    Co-evaluation             End of Session Equipment Utilized During Treatment: Gait belt Activity Tolerance: Patient tolerated treatment well Patient left: in bed;with call bell/phone within reach;with family/visitor present;with nursing/sitter in room;with SCD's reapplied     Time: 2956-2130 PT Time Calculation (min) (ACUTE ONLY): 22 min  Charges:  $Therapeutic Activity: 8-22 mins                    G CodesMichail Jewels PT, [place] [phone]  [phone] 02/12/2015, 4:38 PM

## 2015-02-12 NOTE — Progress Notes (Signed)
Rehab Admissions Coordinator Note:  Patient was screened by Clois DupesBoyette, [place]inty[place] Alexa Godwin for appropriateness for an Inpatient Acute Rehab Consult per PT and OT recommendations. At this time, we await rehab consult completion today and then an Admissions Coordinator will follow up.  Clois DupesBoyette, Tymir Terral Godwin 02/12/2015, 8:27 AM  I can be reached at [phone].

## 2015-02-12 NOTE — Progress Notes (Signed)
Occupational Therapy Treatment Patient Details Name: [name] LagosJimmy L Liddell MRN: [id number] DOB: 06/06/1934 Today's Date: 02/12/2015    History of present illness Pt is a 81 y.o. Male admitted 02/09/15 after fall at home resulting in R femoral fx. Pt is now s/p R cannulated hip pinning on 02/10/15.    OT comments  Pt.s wife present for session and is a great help for physical assist with equipment and translation as needed.  Pt. Requires mod a with x2 person assist for safety and equipment.  Able to take approx. 4 pivotal steps from eob to recliner today with max tactile and instructional cues to manage RUE, and cue initiation of steps.  Remains excellent candidate for CIR level therapies to ensure safe return home and has strong family support at d/c to assist as needed.  Follow Up Recommendations  CIR;Supervision/Assistance - 24 hour    Equipment Recommendations  None recommended by OT    Recommendations for Other Services      Precautions / Restrictions Precautions Precautions: Fall Precaution Comments: hx Right hemiparesis Restrictions RLE Weight Bearing: Weight bearing as tolerated       Mobility Bed Mobility Overal bed mobility: Needs Assistance Bed Mobility: Supine to Sit     Supine to sit: +2 for physical assistance;Mod assist     General bed mobility comments: verbal and tactile cues for technqiue; required assist to elevat trunk and used bed pad to square off hip sat EOB  Transfers Overall transfer level: Needs assistance Equipment used: Rolling walker (2 wheeled) Transfers: Sit to/from [employer];Stand Pivot Transfers Sit to Stand: Mod assist;+2 safety/equipment Stand pivot transfers: Mod assist;+2 safety/equipment       General transfer comment: Cues for technique, hand palcement and safety; overall good initiationa nd rise on LLE; max verbal and hand over hand cues to reach for far armrest during pivot transfer; assist to guide hips to chair; Good rise with dit to stand to  RW; assist to place RUE on RW, but noted good hand opening and reaching for grip; at least mod assist to ease descent to chair    Balance                                   ADL Overall ADL's : Needs assistance/impaired                         Toilet Transfer: Moderate assistance;+2 for safety/equipment;Stand-pivot;RW   Toileting- Clothing Manipulation and Hygiene: Maximal assistance;Sitting/lateral lean Toileting - Clothing Manipulation Details (indicate cue type and reason): simulated during transfer       General ADL Comments: wife is a huge asset! very eager and able to assist with equipment as needed also translating.        Vision                     Perception     Praxis      Cognition   Behavior During Therapy: [facility] HospitalWFL for tasks assessed/performed Overall Cognitive Status: Within Functional Limits for tasks assessed                       Extremity/Trunk Assessment               Exercises     Shoulder Instructions       General Comments      Pertinent Vitals/ Pain  Pain Assessment:  (did not rate but said "pain" when asked if he had any) Pain Intervention(s): Monitored during session;Premedicated before session;Repositioned  Home Living                                          Prior Functioning/Environment              Frequency Min 2X/week     Progress Toward Goals  OT Goals(current goals can now be found in the care plan section)  Progress towards OT goals: Progressing toward goals     Plan Discharge plan remains appropriate    Co-evaluation                 End of Session Equipment Utilized During Treatment: Gait belt;Rolling walker   Activity Tolerance Patient tolerated treatment well   Patient Left in chair;with call bell/phone within reach;with family/visitor present   Nurse Communication          Time: 1610-9604 OT Time Calculation (min): 26  min  Charges: OT General Charges $OT Visit: 1 Procedure OT Treatments $Self Care/Home Management : 23-37 mins  Robet Leu, COTA/L 02/12/2015, 9:16 AM

## 2015-02-12 NOTE — Progress Notes (Signed)
     Subjective:  POD#2 right hip pinning. Patient reports pain as mild.  Resting comfortably in bed.  Worked with PT/OT yesterday.  Wife at the bedside.  Objective:   VITALS:   Filed Vitals:   02/11/15 2028 02/12/15 0000 02/12/15 0400 02/12/15 0616  BP: 126/65   102/62  Pulse: 100   69  Temp: 99.2 F (37.3 C)   98.7 F (37.1 C)  TempSrc: Oral   Oral  Resp: 16 16 16 16   Weight:    61.2 kg (134 lb 14.7 oz)  SpO2: 94% 94% 94% 97%    Neurologically intact ABD soft Neurovascular intact Sensation intact distally Intact pulses distally Dorsiflexion/Plantar flexion intact Incision: dressing C/D/I   Lab Results  Component Value Date   WBC 9.6 02/12/2015   HGB 8.8* 02/12/2015   HCT 28.2* 02/12/2015   MCV 73.1* 02/12/2015   PLT 96* 02/12/2015   BMET    Component Value Date/Time   NA 138 02/12/2015 0517   K 3.5 02/12/2015 0517   CL 108 02/12/2015 0517   CO2 25 02/12/2015 0517   GLUCOSE 93 02/12/2015 0517   BUN 10 02/12/2015 0517   CREATININE 0.64 02/12/2015 0517   CALCIUM 7.7* 02/12/2015 0517   GFRNONAA >90 02/12/2015 0517   GFRAA >90 02/12/2015 0517     Assessment/Plan: 2 Days Post-Op   Principal Problem:   Fracture of femoral neck, right Active Problems:   Tuberculosis of lung, infiltrative   Essential hypertension   Hemiparesis affecting right side as late effect of cerebrovascular accident   OVERACTIVE BLADDER   Anorexia   Inguinal hernia   Closed right hip fracture   Fractured hip   Cerebral infarction due to other mechanism   Microcytic anemia   Thrombocytopenia   Acute respiratory failure with hypoxia   HCAP (healthcare-associated pneumonia)   Sepsis   Up with therapy WBAT in the right hip ASA 325mg  daily for 30 days post op for DVT prophylaxis  [name] [name] 02/12/2015, 8:03 AM Cell [phone]

## 2015-02-12 NOTE — Consult Note (Signed)
Physical Medicine and Rehabilitation Consult Reason for Consult: Right hip fracture Referring Physician: [facility]   HPI: [name] is a 81 y.o. right handed limited English-speaking male with history of CVA 2 with right hemiparesis, hypertension. Patient lives with his wife primarily used a wheelchair prior to admission. He could ambulate short distances with a cane /walker. Presented 02/09/2015 after a recent fall from the toilet landing on his right hip. X-rays and imaging revealed right hip fracture. Underwent cannulated hip pinning 02/10/2015 per Dr. Eulah Pont. Weightbearing as tolerated right lower extremity. Hospital course pain management. Patient continues on aspirin as prior to admission. Acute blood loss anemia 9.4 and monitored. Postop chest x-ray question aspiration pneumonia currently maintained on empiric vancomycin. Speech therapy swallow evaluation placed on a dysphagia 1 thin liquid diet. Physical and occupational therapy evaluations completed 02/11/2015 with recommendations of physical medicine rehabilitation consult  Review of Systems  Gastrointestinal: Positive for constipation.  Genitourinary: Positive for urgency.  Musculoskeletal: Positive for myalgias.  Neurological:       Right side weakness   Past Medical History  Diagnosis Date  . Stroke   . Hypertension   . Enlarged prostate   . Inguinal hernia    Past Surgical History  Procedure Laterality Date  . Brain surgery     History reviewed. No pertinent family history. Social History:  reports that he has never smoked. He has never used smokeless tobacco. He reports that he does not drink alcohol or use illicit drugs. Allergies:  Allergies  Allergen Reactions  . Penicillins Hives   Medications Prior to Admission  Medication Sig Dispense Refill  . Dutasteride-Tamsulosin HCl 0.5-0.4 MG CAPS Take 1 capsule by mouth daily.    Marland Kitchen ibuprofen (ADVIL,MOTRIN) 200 MG tablet Take 400 mg by mouth every 6 (six)  hours as needed for moderate pain.    . metoprolol succinate (TOPROL-XL) 50 MG 24 hr tablet Take 50 mg by mouth daily. Take with or immediately following a meal.    . Elastic Bandages & Supports (HERNIA SUPPORT LEFT MEDIUM) MISC Hernia truss. Wear as needed for support. May select variety 1 each 0    Home: Home Living Family/patient expects to be discharged to:: Private residence Living Arrangements: Spouse/significant other Available Help at Discharge: Family, Available 24 hours/day Type of Home: House Home Access: Other (comment) (small step? mostly level) Home Layout: One level Home Equipment: Wheelchair - power, Tub bench, Walker - 2 wheels, Bedside commode Additional Comments: Has AE for bathroom; not sure if shower seat or bench  Functional History: Prior Function Level of Independence: Needs assistance Gait / Transfers Assistance Needed: mostly transfers to his power wc; reports was able to take a few steps and sometimes would walk in room distances (not sure if with RW or not) ADL's / Homemaking Assistance Needed: pt is independent with bathing and dressing at baseline with wife supervising shower transfer Communication / Swallowing Assistance Needed: Understands English, at times benefitted from wife interpreting [place] Functional Status:  Mobility: Bed Mobility Overal bed mobility: Needs Assistance Bed Mobility: Supine to Sit Supine to sit: +2 for physical assistance, Mod assist General bed mobility comments: verbal and tactile cues for technqiue; required assist to elevat trunk and used bed pad to square off hip sat EOB Transfers Overall transfer level: Needs assistance Equipment used: 1 person hand held assist, 2 person hand held assist, Rolling walker (2 wheeled) Transfers: Sit to/from Stand, [employer] Transfers Sit to Stand: Mod assist, +2 safety/equipment Stand  pivot transfers: Mod assist, +2 safety/equipment General transfer comment: Cues for technique, hand  palcement and safety; overall good initiationa nd rise on LLE; max verbal and hand over hand cues to reach for far armrest during pivot transfer; assist to guide hips to chair; Good rise with dit to stand to RW; assist to place RUE on RW, but noted good hand opening and reaching for grip; at least mod assist to ease descent to chair Ambulation/Gait Ambulation/Gait assistance: +2 physical assistance, Mod assist Ambulation Distance (Feet): 5 Feet Assistive device: Rolling walker (2 wheeled) Gait Pattern/deviations: Step-to pattern, Decreased step length - right, Decreased step length - left, Trunk flexed, Antalgic General Gait Details: Pt very motivated and wanting to try walking; step-by-step cues for gait sequence; mod verbal and tactile cueing for upright posture and to work on extend both snees fully straight in standing; extremely short steps with very small base of support; needing extra support at gait belt while advancing LLE    ADL: ADL Overall ADL's : Needs assistance/impaired Eating/Feeding: Set up, Sitting Grooming: Set up, Sitting Upper Body Bathing: Set up, Sitting (increased time) Lower Body Bathing: Moderate assistance, +2 for physical assistance, Sit to/from stand Upper Body Dressing : Minimal assistance, Sitting Lower Body Dressing: Maximal assistance, +2 for physical assistance, Sit to/from stand Toilet Transfer: Moderate assistance, +2 for safety/equipment, Stand-pivot, RW General ADL Comments: Pt seemed disappointed in his limited mobility.   Cognition: Cognition Overall Cognitive Status: Within Functional Limits for tasks assessed Orientation Level: Oriented X4 Cognition Arousal/Alertness: Awake/alert Behavior During Therapy: WFL for tasks assessed/performed Overall Cognitive Status: Within Functional Limits for tasks assessed  Blood pressure 126/65, pulse 100, temperature 99.2 F (37.3 C), temperature source Oral, resp. rate 16, weight 60.6 kg (133 lb 9.6 oz), SpO2  94 %. Physical Exam  Vitals reviewed. HENT:  Head: Normocephalic.  Eyes: EOM are normal.  Neck: Normal range of motion. Neck supple. No thyromegaly present.  Cardiovascular: Normal rate and regular rhythm.   Respiratory: Effort normal and breath sounds normal. No respiratory distress.  GI: Soft. Bowel sounds are normal. He exhibits no distension.  Musculoskeletal:  Right hip dressed  Neurological: He is alert.  Patient can provide his name and age. Appropriate for yes and no. Follows simple commands. RUE 2 to 2+/5. RLE 1-2/5. LUE 4+/5. Alert and generally appropriate  Skin:  Right hip dressing intact clean and dry appropriately tender  Psychiatric: He has a normal mood and affect. His behavior is normal.    Results for orders placed or performed during the hospital encounter of 02/09/15 (from the past 24 hour(s))  CBC with Differential/Platelet     Status: Abnormal   Collection Time: 02/11/15  7:20 AM  Result Value Ref Range   WBC 10.4 4.0 - 10.5 K/uL   RBC 4.07 (L) 4.22 - 5.81 MIL/uL   Hemoglobin 9.4 (L) 13.0 - 17.0 g/dL   HCT 04.530.1 (L) 40.939.0 - 81.152.0 %   MCV 74.0 (L) 78.0 - 100.0 fL   MCH 23.1 (L) 26.0 - 34.0 pg   MCHC 31.2 30.0 - 36.0 g/dL   RDW 91.413.4 78.211.5 - 95.615.5 %   Platelets PLATELET CLUMPS NOTED ON SMEAR, UNABLE TO ESTIMATE 150 - 400 K/uL   Neutrophils Relative % 77 43 - 77 %   Lymphocytes Relative 8 (L) 12 - 46 %   Monocytes Relative 13 (H) 3 - 12 %   Eosinophils Relative 2 0 - 5 %   Basophils Relative 0 0 - 1 %  Neutro Abs 8.0 (H) 1.7 - 7.7 K/uL   Lymphs Abs 0.8 0.7 - 4.0 K/uL   Monocytes Absolute 1.4 (H) 0.1 - 1.0 K/uL   Eosinophils Absolute 0.2 0.0 - 0.7 K/uL   Basophils Absolute 0.0 0.0 - 0.1 K/uL   Smear Review MORPHOLOGY UNREMARKABLE   Basic metabolic panel     Status: Abnormal   Collection Time: 02/11/15  7:20 AM  Result Value Ref Range   Sodium 134 (L) 135 - 145 mmol/L   Potassium 3.7 3.5 - 5.1 mmol/L   Chloride 104 96 - 112 mmol/L   CO2 27 19 - 32 mmol/L     Glucose, Bld 93 70 - 99 mg/dL   BUN 16 6 - 23 mg/dL   Creatinine, Ser 1.61 0.50 - 1.35 mg/dL   Calcium 7.7 (L) 8.4 - 10.5 mg/dL   GFR calc non Af Amer 85 (L) >90 mL/min   GFR calc Af Amer >90 >90 mL/min   Anion gap 3 (L) 5 - 15  Vitamin B12     Status: None   Collection Time: 02/11/15  7:20 AM  Result Value Ref Range   Vitamin B-12 614 211 - 911 pg/mL  Iron and TIBC     Status: Abnormal   Collection Time: 02/11/15  7:20 AM  Result Value Ref Range   Iron 17 (L) 42 - 165 ug/dL   TIBC 096 (L) 045 - 409 ug/dL   Saturation Ratios 11 (L) 20 - 55 %   UIBC 143 125 - 400 ug/dL  Ferritin     Status: Abnormal   Collection Time: 02/11/15  7:20 AM  Result Value Ref Range   Ferritin 427 (H) 22 - 322 ng/mL  Lactic acid, plasma     Status: None   Collection Time: 02/11/15  8:14 AM  Result Value Ref Range   Lactic Acid, Venous 0.9 0.5 - 2.0 mmol/L  Protime-INR     Status: Abnormal   Collection Time: 02/11/15  8:14 AM  Result Value Ref Range   Prothrombin Time 16.2 (H) 11.6 - 15.2 seconds   INR 1.28 0.00 - 1.49  APTT     Status: Abnormal   Collection Time: 02/11/15  8:14 AM  Result Value Ref Range   aPTT 43 (H) 24 - 37 seconds  Lactic acid, plasma     Status: None   Collection Time: 02/11/15  2:45 PM  Result Value Ref Range   Lactic Acid, Venous 1.9 0.5 - 2.0 mmol/L   Dg Ankle 2 Views Right  02/10/2015   CLINICAL DATA:  Fall with pain and edema. Pain aggravated by walking.  EXAM: RIGHT ANKLE - 2 VIEW  COMPARISON:  Right foot 02/10/2015  FINDINGS: Lateral soft tissue swelling. No evidence for fracture or dislocation. Normal alignment of the ankle.  IMPRESSION: No acute bone abnormality in the ankle.  Evidence for lateral soft tissue swelling.   Electronically Signed   By: Richarda Overlie M.D.   On: 02/10/2015 14:28   Pelvis Portable  02/10/2015   CLINICAL DATA:  Hip fracture.  EXAM: PORTABLE PELVIS 1-2 VIEWS  COMPARISON:  Yesterday's CT.  FINDINGS: Screw fixation of the previously described  right femoral neck fracture. Suboptimal patient positioning, with right upper extremity projecting over the upper pelvis. Patient is also obliqued. No acute hardware complication or superimposed acute fracture identified.  IMPRESSION: Expected appearance after right proximal femoral fixation.   Electronically Signed   By: Jeronimo Greaves M.D.   On: 02/10/2015 13:55  Dg Chest Port 1 View  02/10/2015   CLINICAL DATA:  Hypoxia. Cannulated hip pinning earlier today. Abnormal lung sounds.  EXAM: PORTABLE CHEST - 1 VIEW  COMPARISON:  02/09/2015  FINDINGS: Tortuous and atherosclerotic thoracic aorta. Heart size within normal limits.  Interstitial accentuation in the right lung, especially the right lung apex, increased from prior. The left lung looks clear. No pleural effusion identified.  IMPRESSION: 1. Interstitial accentuation in the right lung appears increased. Differential diagnostic considerations include asymmetric noncardiogenic edema, aspiration pneumonitis, pulmonary embolus, or early patchy right-sided bronchopneumonia. 2. Tortuous and atherosclerotic thoracic aorta.   Electronically Signed   By: Gaylyn Rong M.D.   On: 02/10/2015 19:03   Dg Foot 2 Views Right  02/10/2015   CLINICAL DATA:  Mechanical fall with foot pain and edema.  EXAM: RIGHT FOOT - 2 VIEW  COMPARISON:  None.  FINDINGS: AP and lateral views portably. Mild osteopenia. No acute fracture or dislocation. Small calcaneal spur. Degenerative changes of the tibiotalar joint and first metatarsal phalangeal joint.  IMPRESSION: No acute osseous abnormality.  Limited two-view portable technique.   Electronically Signed   By: Jeronimo Greaves M.D.   On: 02/10/2015 14:37   Dg Hip Operative Unilat With Pelvis Right  02/10/2015   CLINICAL DATA:  ORIF of a right femoral neck fracture.  EXAM: OPERATIVE RIGHT HIP (WITH PELVIS IF PERFORMED)  VIEWS  TECHNIQUE: Fluoroscopic spot image(s) were submitted for interpretation post-operatively.  COMPARISON:  CT,  02/09/2015  FINDINGS: Subcapital fracture has been stabilized with 3 screws. There is no fracture comminution or significant displacement. There is slight residual valgus angulation.  There is no new fracture or evidence of an operative complication.  IMPRESSION: ORIF of a right subcapital femoral neck fracture.   Electronically Signed   By: Amie Portland M.D.   On: 02/10/2015 11:38    Assessment/Plan: Diagnosis: right femoral neck fx 1. Does the need for close, 24 hr/day medical supervision in concert with the patient's rehab needs make it unreasonable for this patient to be served in a less intensive setting? Yes 2. Co-Morbidities requiring supervision/potential complications: hx of cva, htn 3. Due to bladder management, bowel management, safety, skin/wound care, disease management, medication administration, pain management and patient education, does the patient require 24 hr/day rehab nursing? Yes 4. Does the patient require coordinated care of a physician, rehab nurse, PT (1-2 hrs/day, 5 days/week) and OT (1-2 hrs/day, 5 days/week) to address physical and functional deficits in the context of the above medical diagnosis(es)? Yes Addressing deficits in the following areas: balance, endurance, locomotion, strength, transferring, bowel/bladder control, bathing, dressing, feeding, grooming, toileting and psychosocial support 5. Can the patient actively participate in an intensive therapy program of at least 3 hrs of therapy per day at least 5 days per week? Yes 6. The potential for patient to make measurable gains while on inpatient rehab is excellent 7. Anticipated functional outcomes upon discharge from inpatient rehab are supervision to min assist  with PT, supervision and min assist with OT, n/a with SLP. 8. Estimated rehab length of stay to reach the above functional goals is: 12-15 days 9. Does the patient have adequate social supports and living environment to accommodate these discharge  functional goals? Yes 10. Anticipated D/C setting: Home 11. Anticipated post D/C treatments: HH therapy 12. Overall Rehab/Functional Prognosis: excellent  RECOMMENDATIONS: This patient's condition is appropriate for continued rehabilitative care in the following setting: CIR Patient has agreed to participate in recommended program. Yes Note that insurance  prior authorization may be required for reimbursement for recommended care.  Comment: Rehab Admissions Coordinator to follow up.  Thanks,  Ranelle Oyster, MD, [place] [facility]     02/12/2015

## 2015-02-12 NOTE — Progress Notes (Signed)
PROGRESS NOTE  [name] ZOX:[id number] DOB: 1934-06-15 DOA: 02/09/2015 PCP: Lina Sayre, MD    Brief history 81 year old male with a history of hypertension and right hemiparesis secondary to stroke in 2000 and 2004. According to the patient's son, the patient has had a slow gradual functional and current decline since his strokes. At baseline, he is able to transfer and ambulate using a walker. Unfortunately, the patient had a mechanical fall when he missed his chair and attempt to sit down on 02/08/2015. Because of increasing pain and inability to bear weight on his right lower extremity, the patient presented to the emergency department. CT of the right hip revealed a subcapital right femoral neck fracture. Orthopedics was consulted, and patient underwent right hip pinning on 02/10/2015. The patient developed a fever and hypoxemia postoperatively on the evening of 02/10/2015. Pulmonary workup reveals right-sided pneumonia. The patient was started on intravenous imipenem and vancomycin after blood cultures were performed. Assessment/Plan: Right subcapital femoral neck fracture -appreciate ortho -02/10/15--R- hip pinning -pain control -WBAT R-hip per ortho -PT-->recommends CIR -ASA 325mg  per ortho for DVT prophylaxis -d/c foley -d/c telemetry Sepsis/HCAP/acute respiratory hypoxemic failure -stable on 3L [place]-->now weaned to RA and remains stable -Continue IV imipenem -d/c vanco -BP remains soft but stable -continue IVF -Lactic Acid 2.8-->0.9 -pulmonary hygiene -flutter valve -Procalcitonin 0.10-->0.42 Hypertension -hold metoprolol succinate as a patient's blood pressure is soft Thrombocytopenia -Check serum B12--614 -monitor for signs of bleeding BPH -continue avodart and flomax R-inguinal hernia -spoke with [facility]--not feasible to perform hernia surgery at same time as orthopedic surgery History of stroke -continue ASA -residual R-hemiparesis R-foot pain and  edema -xray R-foot and ankle--no fracture or dislocation Microcytic anemia -check iron studies--iron sat 11% -start iron if pt is willing to take it--give a dose of IV nulecit Constipation -+BM 4/3 after carthartic Dysphagia -speech therapy recommends dys 1 diet with thin liquids  Family Communication:wife at bedside updated Disposition Plan: CIR 4/5 if stable        Procedures/Studies: Dg Chest 1 View  02/09/2015   CLINICAL DATA:  Fall 3 days ago with chest pain, initial encounter  EXAM: CHEST  1 VIEW  COMPARISON:  12/12/2009  FINDINGS: Cardiac shadow is within normal limits. The thoracic aorta is tortuous and mildly calcified. Lungs are clear. No pneumothorax is noted. No acute bony abnormality is seen.  IMPRESSION: No acute abnormality noted.   Electronically Signed   By: Alcide Clever M.D.   On: 02/09/2015 19:00   Dg Ankle 2 Views Right  02/10/2015   CLINICAL DATA:  Fall with pain and edema. Pain aggravated by walking.  EXAM: RIGHT ANKLE - 2 VIEW  COMPARISON:  Right foot 02/10/2015  FINDINGS: Lateral soft tissue swelling. No evidence for fracture or dislocation. Normal alignment of the ankle.  IMPRESSION: No acute bone abnormality in the ankle.  Evidence for lateral soft tissue swelling.   Electronically Signed   By: Richarda Overlie M.D.   On: 02/10/2015 14:28   Ct Head Wo Contrast  02/09/2015   CLINICAL DATA:  Fall 2 days ago.  CVA with right-sided weakness.  EXAM: CT HEAD WITHOUT CONTRAST  TECHNIQUE: Contiguous axial images were obtained from the base of the skull through the vertex without intravenous contrast.  COMPARISON:  03/22/2009  FINDINGS: Sinuses/Soft tissues: Mucosal thickening of left maxillary sinus. Clear mastoid air cells. Occipital craniotomy.  Intracranial: Generalized atrophy. Moderate low density in the periventricular white matter likely related  to small vessel disease. Slightly increased. Cerebellar atrophy with encephalomalacia in the medial right cerebellar  hemisphere, as before. Dolichoectasia of the right vertebral artery. No mass lesion, hemorrhage, hydrocephalus, acute infarct, intra-axial, or extra-axial fluid collection.  IMPRESSION: 1.  No acute intracranial abnormality. 2. Cerebral/cerebellar atrophy with progressive small vessel ischemic change. 3. Sinus disease and prior craniotomy.   Electronically Signed   By: Jeronimo Greaves M.D.   On: 02/09/2015 18:37   Pelvis Portable  02/10/2015   CLINICAL DATA:  Hip fracture.  EXAM: PORTABLE PELVIS 1-2 VIEWS  COMPARISON:  Yesterday's CT.  FINDINGS: Screw fixation of the previously described right femoral neck fracture. Suboptimal patient positioning, with right upper extremity projecting over the upper pelvis. Patient is also obliqued. No acute hardware complication or superimposed acute fracture identified.  IMPRESSION: Expected appearance after right proximal femoral fixation.   Electronically Signed   By: Jeronimo Greaves M.D.   On: 02/10/2015 13:55   Ct Hip Right Wo Contrast  02/09/2015   CLINICAL DATA:  Fall, right hip pain  EXAM: CT OF THE RIGHT HIP WITHOUT CONTRAST  TECHNIQUE: Multidetector CT imaging of the right hip was performed according to the standard protocol. Multiplanar CT image reconstructions were also generated.  COMPARISON:  Right hip radiographs dated 02/09/2015  FINDINGS: Subcapital right femoral neck fracture (series 6/image 15), impacted/foreshortened (series 6/image 20).  Visualized bony pelvis appears intact.  Right inguinal/scrotal hernia containing fat and small bowel (series 3/ image 13), incompletely visualized.  Foley catheter in the bladder.  Moderate stool in the rectum.  IMPRESSION: Subcapital right femoral neck fracture, impacted/foreshortened.   Electronically Signed   By: Charline Bills M.D.   On: 02/09/2015 22:03   Dg Chest Port 1 View  02/10/2015   CLINICAL DATA:  Hypoxia. Cannulated hip pinning earlier today. Abnormal lung sounds.  EXAM: PORTABLE CHEST - 1 VIEW  COMPARISON:   02/09/2015  FINDINGS: Tortuous and atherosclerotic thoracic aorta. Heart size within normal limits.  Interstitial accentuation in the right lung, especially the right lung apex, increased from prior. The left lung looks clear. No pleural effusion identified.  IMPRESSION: 1. Interstitial accentuation in the right lung appears increased. Differential diagnostic considerations include asymmetric noncardiogenic edema, aspiration pneumonitis, pulmonary embolus, or early patchy right-sided bronchopneumonia. 2. Tortuous and atherosclerotic thoracic aorta.   Electronically Signed   By: Gaylyn Rong M.D.   On: 02/10/2015 19:03   Dg Foot 2 Views Right  02/10/2015   CLINICAL DATA:  Mechanical fall with foot pain and edema.  EXAM: RIGHT FOOT - 2 VIEW  COMPARISON:  None.  FINDINGS: AP and lateral views portably. Mild osteopenia. No acute fracture or dislocation. Small calcaneal spur. Degenerative changes of the tibiotalar joint and first metatarsal phalangeal joint.  IMPRESSION: No acute osseous abnormality.  Limited two-view portable technique.   Electronically Signed   By: Jeronimo Greaves M.D.   On: 02/10/2015 14:37   Dg Hip Operative Unilat With Pelvis Right  02/10/2015   CLINICAL DATA:  ORIF of a right femoral neck fracture.  EXAM: OPERATIVE RIGHT HIP (WITH PELVIS IF PERFORMED)  VIEWS  TECHNIQUE: Fluoroscopic spot image(s) were submitted for interpretation post-operatively.  COMPARISON:  CT, 02/09/2015  FINDINGS: Subcapital fracture has been stabilized with 3 screws. There is no fracture comminution or significant displacement. There is slight residual valgus angulation.  There is no new fracture or evidence of an operative complication.  IMPRESSION: ORIF of a right subcapital femoral neck fracture.   Electronically Signed   By:  Amie Portlandavid  Ormond M.D.   On: 02/10/2015 11:38   Dg Hip Unilat With Pelvis Min 4 Views Right  02/09/2015   CLINICAL DATA:  Fall 3 days ago with right hip pain, initial encounter  EXAM: RIGHT HIP  (WITH PELVIS) 4+ VIEWS  COMPARISON:  None.  FINDINGS: Pelvic ring is intact. The proximal left femur is within normal limits. Only limited evaluation of the proximal right femur is available due to the patient's inability to adequately position himself. There is some regularity of the femoral neck superimposed over the femoral head. This is suspicious for subcapital femoral neck fracture. CT may be helpful for further evaluation.  IMPRESSION: Changes suspicious for a subcapital femoral neck fracture. CT may be helpful for further evaluation given difficulty positioning the patient.   Electronically Signed   By: Alcide CleverMark  Lukens M.D.   On: 02/09/2015 19:11   Dg Femur, Min 2 Views Right  02/09/2015   CLINICAL DATA:  Fall 3 days ago with right hip and leg pain  EXAM: RIGHT FEMUR 2 VIEWS  COMPARISON:  None.  FINDINGS: Irregularity of the proximal femur is again identified suspicious for subcapital femoral neck fracture. The distal femur is within normal limits. The knee joint is unremarkable.  IMPRESSION: Changes suspicious for proximal right femoral fracture   Electronically Signed   By: Alcide CleverMark  Lukens M.D.   On: 02/09/2015 19:12         Subjective: Patient denies fevers, chills, headache, chest pain, dyspnea, nausea, vomiting, diarrhea, abdominal pain, dysuria, hematuria   Objective: Filed Vitals:   02/11/15 2028 02/12/15 0000 02/12/15 0400 02/12/15 0616  BP: 126/65   102/62  Pulse: 100   69  Temp: 99.2 F (37.3 C)   98.7 F (37.1 C)  TempSrc: Oral   Oral  Resp: 16 16 16 16   Weight:    61.2 kg (134 lb 14.7 oz)  SpO2: 94% 94% 94% 97%    Intake/Output Summary (Last 24 hours) at 02/12/15 1346 Last data filed at 02/12/15 0657  Gross per 24 hour  Intake   1620 ml  Output   1350 ml  Net    270 ml   Weight change: 0.6 kg (1 lb 5.2 oz) Exam:   General:  Pt is alert, follows commands appropriately, not in acute distress  HEENT: No icterus, No thrush, , [place]/AT  Cardiovascular: RRR, S1/S2, no  rubs, no gallops  Respiratory: Bibasilar rales, right greater than left. No wheeze.  Abdomen: Soft/+BS, non tender, non distended, no guarding  Extremities: No edema, No lymphangitis, No petechiae, No rashes, no synovitis  Data Reviewed: Basic Metabolic Panel:  Recent Labs Lab 02/09/15 1807 02/10/15 0458 02/11/15 0720 02/12/15 0517  NA 138 137 134* 138  K 4.6 4.1 3.7 3.5  CL 103 106 104 108  CO2 26 30 27 25   GLUCOSE 124* 112* 93 93  BUN 26* 18 16 10   CREATININE 0.83 0.66 0.76 0.64  CALCIUM 8.9 8.4 7.7* 7.7*  MG  --  2.1  --   --    Liver Function Tests:  Recent Labs Lab 02/09/15 1900 02/10/15 0458  AST 23 22  ALT 14 13  ALKPHOS 43 41  BILITOT 0.6 0.8  PROT 7.2 6.8  ALBUMIN 3.8 3.5   No results for input(s): LIPASE, AMYLASE in the last 168 hours. No results for input(s): AMMONIA in the last 168 hours. CBC:  Recent Labs Lab 02/09/15 1807 02/10/15 0458 02/11/15 0720 02/12/15 0517  WBC 9.4 10.0 10.4 9.6  NEUTROABS 7.6 7.2 8.0*  --   HGB 11.9* 11.4* 9.4* 8.8*  HCT 38.1* 36.7* 30.1* 28.2*  MCV 74.0* 74.1* 74.0* 73.1*  PLT 127* PLATELET CLUMPS NOTED ON SMEAR, COUNT APPEARS DECREASED PLATELET CLUMPS NOTED ON SMEAR, UNABLE TO ESTIMATE 96*   Cardiac Enzymes:  Recent Labs Lab 02/09/15 1807  TROPONINI <0.03   BNP: Invalid input(s): POCBNP CBG: No results for input(s): GLUCAP in the last 168 hours.  Recent Results (from the past 240 hour(s))  Urine culture     Status: None   Collection Time: 02/09/15  7:22 PM  Result Value Ref Range Status   Specimen Description URINE, RANDOM  Final   Special Requests NONE  Final   Colony Count   Final    80,000 COLONIES/ML Performed at [facility]    Culture   Final    Multiple bacterial morphotypes present, none predominant. Suggest appropriate recollection if clinically indicated. Performed at [facility]    Report Status 02/11/2015 FINAL  Final  Surgical pcr screen     Status: None    Collection Time: 02/10/15  6:07 AM  Result Value Ref Range Status   MRSA, PCR NEGATIVE NEGATIVE Final   Staphylococcus aureus NEGATIVE NEGATIVE Final    Comment:        The Xpert SA Assay (FDA approved for NASAL specimens in patients over 59 years of age), is one component of a comprehensive surveillance program.  Test performance has been validated by [facility] for patients greater than or equal to 47 year old. It is not intended to diagnose infection nor to guide or monitor treatment.   Culture, blood (routine x 2)     Status: None (Preliminary result)   Collection Time: 02/10/15  7:50 PM  Result Value Ref Range Status   Specimen Description BLOOD LEFT ARM  Final   Special Requests BOTTLES DRAWN AEROBIC AND ANAEROBIC EACH  Final   Culture   Final           BLOOD CULTURE RECEIVED NO GROWTH TO DATE CULTURE WILL BE HELD FOR 5 DAYS BEFORE ISSUING A FINAL NEGATIVE REPORT Performed at [facility]    Report Status PENDING  Incomplete  Culture, blood (routine x 2)     Status: None (Preliminary result)   Collection Time: 02/10/15  7:57 PM  Result Value Ref Range Status   Specimen Description BLOOD RIGHT ARM  Final   Special Requests BOTTLES DRAWN AEROBIC AND ANAEROBIC  Final   Culture   Final           BLOOD CULTURE RECEIVED NO GROWTH TO DATE CULTURE WILL BE HELD FOR 5 DAYS BEFORE ISSUING A FINAL NEGATIVE REPORT Performed at [facility]    Report Status PENDING  Incomplete     Scheduled Meds: . sodium chloride   Intravenous Once  . aspirin EC  325 mg Oral Daily  . docusate sodium  100 mg Oral BID  . dutasteride  0.5 mg Oral Daily   And  . tamsulosin  0.4 mg Oral Daily  . imipenem-cilastatin  500 mg Intravenous 3 times per day  . senna  2 tablet Oral Daily  . vancomycin  500 mg Intravenous Q12H   Continuous Infusions: . sodium chloride 100 mL/hr at 02/12/15 0657     Miley Lindon, DO  [facility] Pager [phone]  If 7PM-7AM, please  contact night-coverage [email or web address] Password [email or web address] 02/12/2015, 1:46 PM   LOS: 3 days

## 2015-02-12 NOTE — Progress Notes (Signed)
I met with pt and his son, Randall Hiss, at bedside. Both are in agreement to an inpt rehab admission. He has been with [place] twice before. I will follow up tomorrow with possible admission tomorrow pending medical readiness and bed availability. Randall Hiss states they would not agree to [facility] rehab. [phone]

## 2015-02-13 ENCOUNTER — Inpatient Hospital Stay (HOSPITAL_COMMUNITY)
Admission: RE | Admit: 2015-02-13 | Discharge: 2015-02-22 | DRG: 559 | Disposition: A | Discharge status: Home Health | Payer: Medicare Other | Source: Intra-hospital | Attending: Physical Medicine & Rehabilitation | Admitting: Physical Medicine & Rehabilitation

## 2015-02-13 DIAGNOSIS — N318 Other neuromuscular dysfunction of bladder: Secondary | ICD-10-CM | POA: Diagnosis present

## 2015-02-13 DIAGNOSIS — G8110 Spastic hemiplegia affecting unspecified side: Secondary | ICD-10-CM

## 2015-02-13 DIAGNOSIS — S72001K Fracture of unspecified part of neck of right femur, subsequent encounter for closed fracture with nonunion: Secondary | ICD-10-CM

## 2015-02-13 DIAGNOSIS — M7501 Adhesive capsulitis of right shoulder: Secondary | ICD-10-CM | POA: Diagnosis present

## 2015-02-13 DIAGNOSIS — I10 Essential (primary) hypertension: Secondary | ICD-10-CM | POA: Diagnosis not present

## 2015-02-13 DIAGNOSIS — S72001S Fracture of unspecified part of neck of right femur, sequela: Secondary | ICD-10-CM

## 2015-02-13 DIAGNOSIS — S72001A Fracture of unspecified part of neck of right femur, initial encounter for closed fracture: Secondary | ICD-10-CM | POA: Diagnosis present

## 2015-02-13 DIAGNOSIS — I69351 Hemiplegia and hemiparesis following cerebral infarction affecting right dominant side: Secondary | ICD-10-CM

## 2015-02-13 DIAGNOSIS — R509 Fever, unspecified: Secondary | ICD-10-CM

## 2015-02-13 DIAGNOSIS — S72011D Unspecified intracapsular fracture of right femur, subsequent encounter for closed fracture with routine healing: Secondary | ICD-10-CM | POA: Diagnosis not present

## 2015-02-13 DIAGNOSIS — K5900 Constipation, unspecified: Secondary | ICD-10-CM | POA: Diagnosis present

## 2015-02-13 DIAGNOSIS — W1812XD Fall from or off toilet with subsequent striking against object, subsequent encounter: Secondary | ICD-10-CM | POA: Diagnosis present

## 2015-02-13 DIAGNOSIS — D62 Acute posthemorrhagic anemia: Secondary | ICD-10-CM | POA: Diagnosis present

## 2015-02-13 DIAGNOSIS — N400 Enlarged prostate without lower urinary tract symptoms: Secondary | ICD-10-CM | POA: Diagnosis present

## 2015-02-13 DIAGNOSIS — M7989 Other specified soft tissue disorders: Secondary | ICD-10-CM | POA: Diagnosis not present

## 2015-02-13 DIAGNOSIS — J189 Pneumonia, unspecified organism: Secondary | ICD-10-CM | POA: Diagnosis present

## 2015-02-13 DIAGNOSIS — Y95 Nosocomial condition: Secondary | ICD-10-CM | POA: Diagnosis present

## 2015-02-13 DIAGNOSIS — S72009A Fracture of unspecified part of neck of unspecified femur, initial encounter for closed fracture: Secondary | ICD-10-CM | POA: Insufficient documentation

## 2015-02-13 DIAGNOSIS — D509 Iron deficiency anemia, unspecified: Secondary | ICD-10-CM

## 2015-02-13 DIAGNOSIS — G8111 Spastic hemiplegia affecting right dominant side: Secondary | ICD-10-CM | POA: Diagnosis present

## 2015-02-13 DIAGNOSIS — I69851 Hemiplegia and hemiparesis following other cerebrovascular disease affecting right dominant side: Secondary | ICD-10-CM

## 2015-02-13 LAB — CBC WITH DIFFERENTIAL/PLATELET
BASOS PCT: 0 % (ref 0–1)
Basophils Absolute: 0 10*3/uL (ref 0.0–0.1)
EOS PCT: 8 % — AB (ref 0–5)
Eosinophils Absolute: 0.6 10*3/uL (ref 0.0–0.7)
HCT: 31.2 % — ABNORMAL LOW (ref 39.0–52.0)
HEMOGLOBIN: 9.8 g/dL — AB (ref 13.0–17.0)
LYMPHS PCT: 9 % — AB (ref 12–46)
Lymphs Abs: 0.7 10*3/uL (ref 0.7–4.0)
MCH: 23 pg — ABNORMAL LOW (ref 26.0–34.0)
MCHC: 31.4 g/dL (ref 30.0–36.0)
MCV: 73.1 fL — ABNORMAL LOW (ref 78.0–100.0)
Monocytes Absolute: 0.8 10*3/uL (ref 0.1–1.0)
Monocytes Relative: 11 % (ref 3–12)
NEUTROS PCT: 72 % (ref 43–77)
Neutro Abs: 5.6 10*3/uL (ref 1.7–7.7)
Platelets: 107 10*3/uL — ABNORMAL LOW (ref 150–400)
RBC: 4.27 MIL/uL (ref 4.22–5.81)
RDW: 13.6 % (ref 11.5–15.5)
WBC: 7.7 10*3/uL (ref 4.0–10.5)

## 2015-02-13 LAB — COMPREHENSIVE METABOLIC PANEL
ALBUMIN: 2.9 g/dL — AB (ref 3.5–5.2)
ALT: 16 U/L (ref 0–53)
AST: 29 U/L (ref 0–37)
Alkaline Phosphatase: 40 U/L (ref 39–117)
Anion gap: 6 (ref 5–15)
BUN: 7 mg/dL (ref 6–23)
CO2: 26 mmol/L (ref 19–32)
Calcium: 7.9 mg/dL — ABNORMAL LOW (ref 8.4–10.5)
Chloride: 105 mmol/L (ref 96–112)
Creatinine, Ser: 0.61 mg/dL (ref 0.50–1.35)
GFR calc non Af Amer: >90 mL/min (ref >90)
GLUCOSE: 107 mg/dL — AB (ref 70–99)
Potassium: 3.7 mmol/L (ref 3.5–5.1)
SODIUM: 137 mmol/L (ref 135–145)
TOTAL PROTEIN: 6 g/dL (ref 6.0–8.3)
Total Bilirubin: 0.9 mg/dL (ref 0.3–1.2)

## 2015-02-13 LAB — BASIC METABOLIC PANEL
Anion gap: 3 — ABNORMAL LOW (ref 5–15)
BUN: 9 mg/dL (ref 6–23)
CO2: 29 mmol/L (ref 19–32)
CREATININE: 0.62 mg/dL (ref 0.50–1.35)
Calcium: 7.9 mg/dL — ABNORMAL LOW (ref 8.4–10.5)
Chloride: 107 mmol/L (ref 96–112)
GFR calc Af Amer: >90 mL/min (ref >90)
GLUCOSE: 95 mg/dL (ref 70–99)
Potassium: 3.7 mmol/L (ref 3.5–5.1)
SODIUM: 139 mmol/L (ref 135–145)

## 2015-02-13 MED ORDER — LEVOFLOXACIN 750 MG PO TABS
750.0000 mg | ORAL_TABLET | Freq: Every day | ORAL | Status: DC
  Filled 2015-02-13: qty 1

## 2015-02-13 MED ORDER — DOCUSATE SODIUM 100 MG PO CAPS
100.0000 mg | ORAL_CAPSULE | Freq: Two times a day (BID) | ORAL | Status: DC
  Administered 2015-02-13 – 2015-02-22 (×18): 100 mg via ORAL
  Filled 2015-02-13 (×20): qty 1

## 2015-02-13 MED ORDER — ASPIRIN EC 325 MG PO TBEC
325.0000 mg | DELAYED_RELEASE_TABLET | Freq: Every day | ORAL | Status: DC
  Administered 2015-02-14 – 2015-02-22 (×9): 325 mg via ORAL
  Filled 2015-02-13 (×10): qty 1

## 2015-02-13 MED ORDER — SORBITOL 70 % SOLN: 30.0000 mL | Freq: Every day | Status: DC | PRN

## 2015-02-13 MED ORDER — BISACODYL 10 MG RE SUPP: 10.0000 mg | Freq: Every day | RECTAL | Status: DC | PRN

## 2015-02-13 MED ORDER — METOPROLOL SUCCINATE ER 25 MG PO TB24: 12.5000 mg | ORAL_TABLET | Freq: Every day | ORAL | Status: DC

## 2015-02-13 MED ORDER — ONDANSETRON HCL 4 MG/2ML IJ SOLN: 4.0000 mg | Freq: Four times a day (QID) | INTRAMUSCULAR | Status: DC | PRN

## 2015-02-13 MED ORDER — HYDROCODONE-ACETAMINOPHEN 5-325 MG PO TABS
1.0000 | ORAL_TABLET | Freq: Four times a day (QID) | ORAL | Status: DC | PRN
  Administered 2015-02-13: 2 via ORAL
  Filled 2015-02-13: qty 2

## 2015-02-13 MED ORDER — TAMSULOSIN HCL 0.4 MG PO CAPS
0.4000 mg | ORAL_CAPSULE | Freq: Every day | ORAL | Status: DC
  Administered 2015-02-14 – 2015-02-22 (×9): 0.4 mg via ORAL
  Filled 2015-02-13 (×11): qty 1

## 2015-02-13 MED ORDER — LEVOFLOXACIN 750 MG PO TABS: 750.0000 mg | ORAL_TABLET | Freq: Every day | ORAL | Status: DC

## 2015-02-13 MED ORDER — OXYCODONE HCL 5 MG PO TABS: 5.0000 mg | ORAL_TABLET | ORAL | Status: DC | PRN

## 2015-02-13 MED ORDER — DUTASTERIDE 0.5 MG PO CAPS
0.5000 mg | ORAL_CAPSULE | Freq: Every day | ORAL | Status: DC
  Administered 2015-02-14 – 2015-02-22 (×9): 0.5 mg via ORAL
  Filled 2015-02-13 (×10): qty 1

## 2015-02-13 MED ORDER — SENNA 8.6 MG PO TABS: 2.0000 | ORAL_TABLET | Freq: Every day | ORAL | Status: DC

## 2015-02-13 MED ORDER — LEVOFLOXACIN 500 MG PO TABS
750.0000 mg | ORAL_TABLET | Freq: Every day | ORAL | Status: DC
  Administered 2015-02-13: 750 mg via ORAL
  Filled 2015-02-13: qty 2

## 2015-02-13 MED ORDER — ACETAMINOPHEN 325 MG PO TABS
650.0000 mg | ORAL_TABLET | Freq: Four times a day (QID) | ORAL | Status: DC | PRN
Start: 2015-02-13 — End: 2015-02-22
  Administered 2015-02-17 – 2015-02-21 (×5): 650 mg via ORAL
  Filled 2015-02-13 (×6): qty 2

## 2015-02-13 MED ORDER — ACETAMINOPHEN 650 MG RE SUPP: 650.0000 mg | Freq: Four times a day (QID) | RECTAL | Status: DC | PRN

## 2015-02-13 MED ORDER — ONDANSETRON HCL 4 MG PO TABS
4.0000 mg | ORAL_TABLET | Freq: Four times a day (QID) | ORAL | Status: DC | PRN
Start: 2015-02-13 — End: 2015-02-22

## 2015-02-13 MED ORDER — LEVOFLOXACIN 750 MG PO TABS
750.0000 mg | ORAL_TABLET | Freq: Every day | ORAL | Status: AC
  Administered 2015-02-14 – 2015-02-17 (×4): 750 mg via ORAL
  Filled 2015-02-13 (×4): qty 1

## 2015-02-13 NOTE — H&P (Addendum)
Physical Medicine and Rehabilitation Admission H&P   Chief Complaint  Patient presents with  . Fall  . Hip Pain  : HPI: [name] is a 81 y.o. right handed limited English-speaking male with history of CVA 2 with right hemiparesis and well known to rehabilitation services, hypertension. Patient lives with his wife primarily used a wheelchair prior to admission. He could ambulate short distances with a cane /walker. Presented 02/09/2015 after a recent fall from the toilet landing on his right hip. X-rays and imaging revealed right hip fracture. Underwent cannulated hip pinning 02/10/2015 per Dr. Percell Miller. Weightbearing as tolerated right lower extremity. Hospital course pain management. Patient continues on aspirin as prior to admission. Acute blood loss anemia 8.8 and monitored.Marland Kitchen Postoperative chest x-ray shows suspected right sided pneumonia and currently maintained on Primaxin since 02/10/2015 with blood cultures negative and changed to Levaquin for 5 2016. Speech therapy swallow evaluation placed on a dysphagia 2 thin liquid diet. Physical and occupational therapy evaluations completed 02/11/2015 with recommendations of physical medicine rehabilitation consult. Patient was admitted for comprehensive rehabilitation program  ROS Review of Systems  Gastrointestinal: Positive for constipation.  Genitourinary: Positive for urgency.  Musculoskeletal: Positive for myalgias.  Neurological:   Right side weakness  Remaining review of systems negative   Past Medical History  Diagnosis Date  . Stroke   . Hypertension   . Enlarged prostate   . Inguinal hernia    Past Surgical History  Procedure Laterality Date  . Brain surgery    . Hip pinning,cannulated Right 02/10/2015    Procedure: CANNULATED HIP PINNING; Surgeon: Renette Butters, MD; Location: [place]; Service: Orthopedics; Laterality: Right;   History reviewed. No pertinent  family history. Social History:  reports that he has never smoked. He has never used smokeless tobacco. He reports that he does not drink alcohol or use illicit drugs. Allergies:  Allergies  Allergen Reactions  . Penicillins Hives   Medications Prior to Admission  Medication Sig Dispense Refill  . Dutasteride-Tamsulosin HCl 0.5-0.4 MG CAPS Take 1 capsule by mouth daily.    Marland Kitchen ibuprofen (ADVIL,MOTRIN) 200 MG tablet Take 400 mg by mouth every 6 (six) hours as needed for moderate pain.    . metoprolol succinate (TOPROL-XL) 50 MG 24 hr tablet Take 50 mg by mouth daily. Take with or immediately following a meal.    . Elastic Bandages & Supports (HERNIA SUPPORT LEFT MEDIUM) MISC Hernia truss. Wear as needed for support. May select variety 1 each 0    Home: Home Living Family/patient expects to be discharged to:: Private residence Living Arrangements: Spouse/significant other Available Help at Discharge: Family, Available 24 hours/day Type of Home: House Home Access: Other (comment) (small step? mostly level) Home Layout: One level Home Equipment: Wheelchair - power, Tub bench, Walker - 2 wheels, Bedside commode Additional Comments: Has AE for bathroom; not sure if shower seat or bench  Functional History: Prior Function Level of Independence: Needs assistance Gait / Transfers Assistance Needed: mostly transfers to his power wc; reports was able to take a few steps and sometimes would walk in room distances (not sure if with RW or not) ADL's / Homemaking Assistance Needed: pt is independent with bathing and dressing at baseline with wife supervising shower transfer Communication / Swallowing Assistance Needed: Understands English, at times benefitted from wife interpreting [place]  Functional Status:  Mobility: Bed Mobility Overal bed mobility: Needs Assistance Bed Mobility: Supine to Sit, Sit to Supine Supine to sit: +2 for physical  assistance, Mod  assist Sit to supine: +2 for physical assistance, Mod assist General bed mobility comments: verbal and tactile cues for technique. Required assist w/ trunk posteriorly and w/ bringing BLEs into and OOB.  Transfers Overall transfer level: Needs assistance Equipment used: Rolling walker (2 wheeled) Transfers: Sit to/from Stand Sit to Stand: Mod assist, +2 physical assistance Stand pivot transfers: Mod assist, +2 safety/equipment General transfer comment: Cues for hand placement on bed and RW. Assist to stand upright w/ pt leaning posteriorly w/ slight loss of balance. Ambulation/Gait Ambulation/Gait assistance: +2 physical assistance, Mod assist Ambulation Distance (Feet): 4 Feet Assistive device: Rolling walker (2 wheeled) Gait Pattern/deviations: Step-to pattern, Decreased stride length, Antalgic, Shuffle, Leaning posteriorly, Trunk flexed, Narrow base of support Gait velocity interpretation: Below normal speed for age/gender General Gait Details: Pt's B knees and hips remained in flexion despite verbal cues to stand upright. Pt required assist w/ trunk posteriorly to maintain balance despite having BUEs on RW. Verbal cues for proper sequencing. Pt's trunk flexed w/ narrow base of support. Pt required increased assist +2 when shifting weight to RLE to advance LLE likely 2/2 h/o R hemi.    ADL: ADL Overall ADL's : Needs assistance/impaired Eating/Feeding: Set up, Sitting Grooming: Set up, Sitting Upper Body Bathing: Set up, Sitting (increased time) Lower Body Bathing: Moderate assistance, +2 for physical assistance, Sit to/from stand Upper Body Dressing : Minimal assistance, Sitting Lower Body Dressing: Maximal assistance, +2 for physical assistance, Sit to/from stand Toilet Transfer: Moderate assistance, +2 for safety/equipment, Stand-pivot, RW Toileting- Clothing Manipulation and Hygiene: Maximal assistance, Sitting/lateral lean Toileting - Clothing Manipulation Details  (indicate cue type and reason): simulated during transfer General ADL Comments: wife is a huge asset! very eager and able to assist with equipment as needed also translating.   Cognition: Cognition Overall Cognitive Status: Within Functional Limits for tasks assessed Orientation Level: Oriented X4 Cognition Arousal/Alertness: Awake/alert Behavior During Therapy: WFL for tasks assessed/performed Overall Cognitive Status: Within Functional Limits for tasks assessed  Physical Exam: Blood pressure 128/60, pulse 93, temperature 98.3 F (36.8 C), temperature source Oral, resp. rate 18, weight 61.2 kg (134 lb 14.7 oz), SpO2 93 %. Physical Exam Vitals reviewed. HENT: oral mucosa pink and moist Head: Normocephalic.  Eyes: EOM are normal.  Neck: Normal range of motion. Neck supple. No thyromegaly present.  Cardiovascular: Normal rate and regular rhythm.  Respiratory: Effort normal and breath sounds normal. No respiratory distress.  GI: Soft. Bowel sounds are normal. He exhibits no distension.  Musculoskeletal:  Right hip dressed, thigh tender to palpation and ROM. Adhesive capsulitis right shoulder--30 degrees abd Neurological: He is alert.  Patient can provide his name and age. Appropriate for yes and no. Follows simple commands. RUE 2 to 2+/5. RLE 1-2/5. LUE 4+/5. Alert and generally appropriate. Underling tone MAS 2-3 throughout the RUE. RLE MAS 1-2 Skin:  Right hip dressing intact clean and dry   Psychiatric: He has a normal mood and affect. His behavior is normal. Cooperative. Fair insight and awareness. Language barrier    Lab Results Last 48 Hours    Results for orders placed or performed during the hospital encounter of 02/09/15 (from the past 48 hour(s))  Lactic acid, plasma Status: None   Collection Time: 02/11/15 2:45 PM  Result Value Ref Range   Lactic Acid, Venous 1.9 0.5 - 2.0 mmol/L  Procalcitonin Status: None   Collection Time: 02/12/15  5:17 AM  Result Value Ref Range   Procalcitonin 0.42 ng/mL    Comment:  Interpretation: PCT (Procalcitonin) <= 0.5 ng/mL: Systemic infection (sepsis) is not likely. Local bacterial infection is possible. (NOTE)  ICU PCT Algorithm Non ICU PCT Algorithm  ---------------------------- ------------------------------  PCT < 0.25 ng/mL PCT < 0.1 ng/mL  Stopping of antibiotics Stopping of antibiotics  strongly encouraged. strongly encouraged.  ---------------------------- ------------------------------  PCT level decrease by PCT < 0.25 ng/mL  >= 80% from peak PCT  OR PCT 0.25 - 0.5 ng/mL Stopping of antibiotics  encouraged.  Stopping of antibiotics  encouraged.  ---------------------------- ------------------------------  PCT level decrease by PCT >= 0.25 ng/mL  < 80% from peak PCT  AND PCT >= 0.5 ng/mL Continuin g antibiotics  encouraged.  Continuing antibiotics  encouraged.  ---------------------------- ------------------------------  PCT level increase compared PCT > 0.5 ng/mL  with peak PCT AND  PCT >= 0.5 ng/mL Escalation of antibiotics  strongly encouraged.  Escalation of antibiotics  strongly encouraged.   CBC Status: Abnormal   Collection Time: 02/12/15 5:17 AM  Result Value Ref Range   WBC 9.6 4.0 - 10.5 K/uL   RBC 3.86 (L) 4.22 - 5.81 MIL/uL   Hemoglobin 8.8 (L) 13.0 - 17.0 g/dL   HCT 28.2 (L) 39.0 - 52.0 %   MCV 73.1 (L) 78.0 - 100.0 fL   MCH 22.8 (L) 26.0 - 34.0 pg   MCHC 31.2 30.0 - 36.0 g/dL   RDW 13.3 11.5 - 15.5 %    Platelets 96 (L) 150 - 400 K/uL  Basic metabolic panel Status: Abnormal   Collection Time: 02/12/15 5:17 AM  Result Value Ref Range   Sodium 138 135 - 145 mmol/L   Potassium 3.5 3.5 - 5.1 mmol/L   Chloride 108 96 - 112 mmol/L   CO2 25 19 - 32 mmol/L   Glucose, Bld 93 70 - 99 mg/dL   BUN 10 6 - 23 mg/dL   Creatinine, Ser 0.64 0.50 - 1.35 mg/dL   Calcium 7.7 (L) 8.4 - 10.5 mg/dL   GFR calc non Af Amer >90 >90 mL/min   GFR calc Af Amer >90 >90 mL/min    Comment: (NOTE) The eGFR has been calculated using the CKD EPI equation. This calculation has not been validated in all clinical situations. eGFR's persistently <90 mL/min signify possible Chronic Kidney Disease.    Anion gap 5 5 - 15      Imaging Results (Last 48 hours)    No results found.       Medical Problem List and Plan: 1. Functional deficits secondary to right femoral neck fracture. Status post cannulated hip pinning 02/10/2015. Weightbearing as tolerated  2. DVT Prophylaxis/Anticoagulation: SCDs. Check vascular study 3. Pain Management: Oxycodone and Robaxin as needed. Monitor with decreased mobility 4. Acute blood loss anemia. Follow-up CBC 5. Neuropsych: This patient is capable of making decisions on his own behalf. 6. Skin/Wound Care: Routine skin checks 7. Fluids/Electrolytes/Nutrition: Strict I&O with follow-up chemistries 8. History of CVA 2 with right hemiparesis. Continue aspirin 9. History of Hypertension. Patient on Toprol 50 mg daily prior to admission. Resume as tolerated 10. BPH. Avodart/Flomax. Check PVRs 3 11.HCAP. Levaquin 4 days 12. Spastic right hemiparesis, adhesive capsulitis right shoulder - aggressive ROM, consider oral antispasmodic, botox is a consideration---probably more an outpt option.  Post Admission Physician Evaluation: 1. Functional deficits secondary to right FNF. 2. Patient is admitted to receive collaborative,  interdisciplinary care between the physiatrist, rehab nursing staff, and therapy team. 3. Patient's level of medical complexity and substantial therapy needs in context of that medical  necessity cannot be provided at a lesser intensity of care such as a SNF. 4. Patient has experienced substantial functional loss from his/her baseline which was documented above under the "Functional History" and "Functional Status" headings. Judging by the patient's diagnosis, physical exam, and functional history, the patient has potential for functional progress which will result in measurable gains while on inpatient rehab. These gains will be of substantial and practical use upon discharge in facilitating mobility and self-care at the household level. 5. Physiatrist will provide 24 hour management of medical needs as well as oversight of the therapy plan/treatment and provide guidance as appropriate regarding the interaction of the two. 6. 24 hour rehab nursing will assist with bladder management, bowel management, safety, skin/wound care, disease management, medication administration, pain management and patient education and help integrate therapy concepts, techniques,education, etc. 7. PT will assess and treat for/with: Lower extremity strength, range of motion, stamina, balance, functional mobility, safety, adaptive techniques and equipment, tone mgt, pain control, ortho precautions. Goals are: supervision to min assist. 8. OT will assess and treat for/with: ADL's, functional mobility, safety, upper extremity strength, adaptive techniques and equipment, pain mgt, ortho precautions, ROM, tonecontrol, family e. Goals are: supervision to min assist. Therapy may not yet proceed with showering this patient. 9. SLP will assess and treat for/with: n/a. Goals are: n/a. 10. Case Management and Social Worker will assess and treat for psychological issues and discharge planning. 11. Team conference will be held weekly  to assess progress toward goals and to determine barriers to discharge. 12. Patient will receive at least 3 hours of therapy per day at least 5 days per week. 13. ELOS: 11-15 days  14. Prognosis: excellent     Meredith Staggers, MD, [facility] 02/13/2015

## 2015-02-13 NOTE — Progress Notes (Signed)
Physical Therapy Treatment Patient Details Name: [name] MRN: [id number] DOB: 1933-12-09 Today's Date: 02/13/2015    History of Present Illness Pt is a 81 y.o. Male admitted 02/09/15 after fall at home resulting in R femoral fx. Pt is now s/p R cannulated hip pinning on 02/10/15.     PT Comments    Pt improved w/ sequencing of BLEs during ambulation from chair to bed.  Pt w/ slight posterior lean in standing which improved following verbal cues to stand upright.  Pt will benefit from further skilled PT at CIR to increase pt's functional independence.     Follow Up Recommendations  CIR     Equipment Recommendations   (to be determined)    Recommendations for Other Services       Precautions / Restrictions Precautions Precautions: Fall Precaution Comments: hx Right hemiparesis Restrictions Weight Bearing Restrictions: Yes RLE Weight Bearing: Weight bearing as tolerated    Mobility  Bed Mobility Overal bed mobility: Needs Assistance Bed Mobility: Sit to Supine       Sit to supine: +2 for physical assistance;Mod assist   General bed mobility comments: verbal and tactile cues for technique.  Required assist w/ trunk posteriorly and w/ bringing BLEs into bed.  Transfers Overall transfer level: Needs assistance Equipment used: Rolling walker (2 wheeled) Transfers: Sit to/from Stand Sit to Stand: +2 physical assistance;+2 safety/equipment;Mod assist         General transfer comment: Cues for hand placement on armrests.  Assist to stand upright w/ pt leaning posteriorly w/ slight loss of balance.  Ambulation/Gait Ambulation/Gait assistance: +2 physical assistance;+2 safety/equipment;Mod assist Ambulation Distance (Feet): 8 Feet Assistive device: Rolling walker (2 wheeled) Gait Pattern/deviations: Step-to pattern;Decreased stride length;Decreased stance time - right;Shuffle;Antalgic;Leaning posteriorly;Trunk flexed;Narrow base of support   Gait velocity  interpretation: Below normal speed for age/gender General Gait Details: Pt's trunk flexed w/ narrow base of support and B knee in flexion w/ posterior lean of trunk.  Verbal cues to stand upright w/ slight improvement this session and only min verbal cues for proper sequencing of BLEs while ambulating to bed.   Stairs            Wheelchair Mobility    Modified Rankin (Stroke Patients Only)       Balance Overall balance assessment: Needs assistance Sitting-balance support: Bilateral upper extremity supported;Feet supported Sitting balance-Leahy Scale: Fair Sitting balance - Comments: initially needing support at trunk, but able to organize and not need support Postural control: Posterior lean Standing balance support: Bilateral upper extremity supported;During functional activity Standing balance-Leahy Scale: Poor                      Cognition Arousal/Alertness: Awake/alert Behavior During Therapy: WFL for tasks assessed/performed Overall Cognitive Status: Within Functional Limits for tasks assessed                      Exercises Total Joint Exercises Ankle Circles/Pumps: AROM;Both;10 reps;Seated Quad Sets: AROM;Both;5 reps;Seated Heel Slides: AROM;Both;5 reps;Seated    General Comments General comments (skin integrity, edema, etc.): Educated pt and pt's wife extensively on hip precautions using demonstration, handout, and verbal instruction.  Pt's wife and pt verbalized understanding and pt's wife demonstrated understanding.      Pertinent Vitals/Pain Pain Assessment: Faces Faces Pain Scale: Hurts a little bit Pain Location: pt did not specify Pain Descriptors / Indicators: Grimacing Pain Intervention(s): Limited activity within patient's tolerance;Monitored during session;Repositioned    Home Living  Prior Function            PT Goals (current goals can now be found in the care plan section) Acute Rehab PT  Goals Patient Stated Goal: none stated. PT Goal Formulation: With patient/family Time For Goal Achievement: 02/25/15 Potential to Achieve Goals: Good Progress towards PT goals: Progressing toward goals    Frequency  Min 6X/week    PT Plan Current plan remains appropriate    Co-evaluation             End of Session Equipment Utilized During Treatment: Gait belt Activity Tolerance: Patient limited by fatigue;Patient tolerated treatment well Patient left: in bed;with call bell/phone within reach;with family/visitor present (w/ pillow placed between BLEs)     Time: [id number] PT Time Calculation (min) (ACUTE ONLY): 24 min  Charges:  $Gait Training: 8-22 mins $Therapeutic Exercise: 8-22 mins                    G CodesMichail Jewels:       Ashley Parr PT, [place] [phone]  [phone] 02/13/2015, 12:22 PM

## 2015-02-13 NOTE — PMR Pre-admission (Signed)
PMR Admission Coordinator Pre-Admission Assessment  Patient: [name] is an 81 y.o., male MRN: [id number] DOB: Jan 01, 1934 Height:   Weight: 61.2 kg (134 lb 14.7 oz)              Insurance Information HMO:     PPO:      PCP:      IPA:      80/20: yes     OTHER:  No HMO PRIMARY: Medicare a and b      Policy#: [id number]      Subscriber: pt     Employer:  retired Benefits:  Phone #: palmetto online     Name: 02/13/2015 Eff. Date: a 09/10/01 and b 09/10/02     Deduct: $1288      Out of Pocket Max: none      Life Max: none CIR: 100%      SNF: 20 full days Outpatient: 80%     Co-Pay: 20% Home Health: 100%      Co-Pay: none DME: 80%     Co-Pay: 20% Providers: pt choice  SECONDARY: [employer] [place]      Policy#: [id number]      Subscriber: pt No auth required  Medicaid Application Date:       Case Manager:  Disability Application Date:       Case Worker:   Emergency Contact Information Contact Information    Name Relation Home Work Mobile   [place] Son [phone]    Edyn, Qazi   [phone]     Current Medical History  Patient Admitting Diagnosis: right femoral neck fracture  History of Present Illness: [name] is a 81 y.o. right handed limited English-speaking male with history of CVA 2 with right hemiparesis, hypertension. Patient lives with his wife primarily used a wheelchair prior to admission. He could ambulate short distances with a cane /walker. Presented 02/09/2015 after a recent fall from the toilet landing on his right hip. X-rays and imaging revealed right hip fracture. Underwent cannulated hip pinning 02/10/2015 per Dr. Eulah Pont. Weightbearing as tolerated right lower extremity.   Patient continues on aspirin as prior to admission. Acute blood loss anemia 9.4 and monitored. Postop chest x-ray question aspiration pneumonia currently maintained on empiric vancomycin. Speech therapy swallow evaluation placed on a dysphagia 1 thin liquid  diet.   Past Medical History  Past Medical History  Diagnosis Date  . Stroke   . Hypertension   . Enlarged prostate   . Inguinal hernia     Family History  family history is not on file.  Prior Rehab/Hospitalizations: [facility] twice after [facility]   Current Medications   Current facility-administered medications:  .  0.9 %  sodium chloride infusion, , Intravenous, Once, Drema Dallas, MD, Last Rate: 10 mL/hr at 02/10/15 0120 .  acetaminophen (TYLENOL) tablet 650 mg, 650 mg, Oral, Q6H PRN, 650 mg at 02/10/15 2047 **OR** acetaminophen (TYLENOL) suppository 650 mg, 650 mg, Rectal, Q6H PRN, Drema Dallas, MD .  aspirin EC tablet 325 mg, 325 mg, Oral, Daily, Catarina Hartshorn, MD, 325 mg at 02/13/15 1229 .  bisacodyl (DULCOLAX) suppository 10 mg, 10 mg, Rectal, Daily PRN, Catarina Hartshorn, MD, 10 mg at 02/11/15 2030 .  docusate sodium (COLACE) capsule 100 mg, 100 mg, Oral, BID, Drema Dallas, MD, 100 mg at 02/13/15 1230 .  dutasteride (AVODART) capsule 0.5 mg, 0.5 mg, Oral, Daily, 0.5 mg at 02/13/15 1230 **AND** tamsulosin (FLOMAX) capsule 0.4 mg, 0.4 mg, Oral, Daily, Roselind Messier  Joseph Art, MD, 0.4 mg at 02/13/15 1230 .  HYDROcodone-acetaminophen (NORCO/VICODIN) 5-325 MG per tablet 1-2 tablet, 1-2 tablet, Oral, Q6H PRN, Catarina Hartshorn, MD, 2 tablet at 02/13/15 0650 .  levofloxacin (LEVAQUIN) tablet 750 mg, 750 mg, Oral, Daily, David Tat, MD .  menthol-cetylpyridinium (CEPACOL) lozenge 3 mg, 1 lozenge, Oral, PRN **OR** phenol (CHLORASEPTIC) mouth spray 1 spray, 1 spray, Mouth/Throat, PRN, Catarina Hartshorn, MD .  morphine 2 MG/ML injection 1-3 mg, 1-3 mg, Intravenous, Q2H PRN, Drema Dallas, MD, 1 mg at 02/10/15 2001 .  oxyCODONE (Oxy IR/ROXICODONE) immediate release tablet 5 mg, 5 mg, Oral, Q4H PRN, Drema Dallas, MD, 5 mg at 02/13/15 0857 .  senna (SENOKOT) tablet 17.2 mg, 2 tablet, Oral, Daily, Catarina Hartshorn, MD, 17.2 mg at 02/13/15 1230 .  zolpidem (AMBIEN) tablet 5 mg, 5 mg, Oral, QHS PRN, Drema Dallas, MD  Patients Current  Diet: DIET DYS 2 Room service appropriate?: Yes; Fluid consistency:: Thin Diet - low sodium heart healthy  Precautions / Restrictions Precautions Precautions: Fall Precaution Comments: hx Right hemiparesis Restrictions Weight Bearing Restrictions: Yes RLE Weight Bearing: Weight bearing as tolerated   Prior Activity Level Limited Community (1-2x/wk): used hover round for mobility. Did self transfers. Could walk with cane about 10 feet with shuffling gait Did own adls with wife providing supervision only  Journalist, newspaper / Equipment Home Equipment: Wheelchair - power, Tub bench, Environmental consultant - 2 wheels, Bedside commode  Prior Functional Level Prior Function Level of Independence: Needs assistance Gait / Transfers Assistance Needed: did own transfers, walked with cane and shuffled 10 feet from home to car with son, self transfers to tub ADL's / Homemaking Assistance Needed: pt is independent with bathing and dressing at baseline with wife supervising shower transfer Communication / Swallowing Assistance Needed: Understands English, at times benefitted from wife interpreting [place]  Current Functional Level Cognition  Overall Cognitive Status: Within Functional Limits for tasks assessed Orientation Level: Oriented X4    Extremity Assessment (includes Sensation/Coordination)  Upper Extremity Assessment: RUE deficits/detail, Generalized weakness RUE Deficits / Details: hx of Right hemiparesis. RUE with some slow movement and able to reach to hold RW with assistance.   Lower Extremity Assessment: Defer to PT evaluation RLE Deficits / Details: Decr AROM and strength, limited by pain postop; also with history of R hemiparesis, and noted tendency to hold hip in grossly flexed; difficulty fully extending hip, but able to actively move in all plnaes    ADLs  Overall ADL's : Needs assistance/impaired Eating/Feeding: Set up, Sitting Grooming: Set up, Sitting Upper Body Bathing: Set up,  Sitting (increased time) Lower Body Bathing: Moderate assistance, +2 for physical assistance, Sit to/from stand Upper Body Dressing : Minimal assistance, Sitting Lower Body Dressing: Maximal assistance, +2 for physical assistance, Sit to/from stand Toilet Transfer: Moderate assistance, +2 for safety/equipment, Stand-pivot, RW Toileting- Clothing Manipulation and Hygiene: Maximal assistance, Sitting/lateral lean Toileting - Clothing Manipulation Details (indicate cue type and reason): simulated during transfer General ADL Comments: wife is a huge asset! very eager and able to assist with equipment as needed also translating.      Mobility  Overal bed mobility: Needs Assistance Bed Mobility: Sit to Supine Supine to sit: +2 for physical assistance, Mod assist Sit to supine: +2 for physical assistance, Mod assist General bed mobility comments: verbal and tactile cues for technique.  Required assist w/ trunk posteriorly and w/ bringing BLEs into bed.    Transfers  Overall transfer level: Needs assistance Equipment used: Rolling  walker (2 wheeled) Transfers: Sit to/from Stand Sit to Stand: +2 physical assistance, +2 safety/equipment, Mod assist Stand pivot transfers: Mod assist, +2 safety/equipment General transfer comment: Cues for hand placement on armrests.  Assist to stand upright w/ pt leaning posteriorly w/ slight loss of balance.    Ambulation / Gait / Stairs / Wheelchair Mobility  Ambulation/Gait Ambulation/Gait assistance: +2 physical assistance, +2 safety/equipment, Mod assist Ambulation Distance (Feet): 8 Feet Assistive device: Rolling walker (2 wheeled) Gait Pattern/deviations: Step-to pattern, Decreased stride length, Decreased stance time - right, Shuffle, Antalgic, Leaning posteriorly, Trunk flexed, Narrow base of support Gait velocity interpretation: Below normal speed for age/gender General Gait Details: Pt's trunk flexed w/ narrow base of support and B knee in flexion w/  posterior lean of trunk.  Verbal cues to stand upright w/ slight improvement this session and only min verbal cues for proper sequencing of BLEs while ambulating to bed.    Posture / Balance Dynamic Sitting Balance Sitting balance - Comments: initially needing support at trunk, but able to organize and not need support Balance Overall balance assessment: Needs assistance Sitting-balance support: Bilateral upper extremity supported, Feet supported Sitting balance-Leahy Scale: Fair Sitting balance - Comments: initially needing support at trunk, but able to organize and not need support Postural control: Posterior lean Standing balance support: Bilateral upper extremity supported, During functional activity Standing balance-Leahy Scale: Poor    Special needs/care consideration Oxygen O 2 at 2 liters [place] Skin surgical dressing Bowel mgmt: BM 02/12/15 Bladder mgmt: continent Wife and son translate for pt. Son states they prefer no translator    Previous Home Environment Living Arrangements: Spouse/significant other  Lives With: Spouse Available Help at Discharge: Family, Available 24 hours/day Type of Home: House Home Layout: One level Home Access: Other (comment) (small one step entry) Bathroom Shower/Tub: Engineer, manufacturing systemsTub/shower unit Bathroom Toilet: Standard Bathroom Accessibility: Yes How Accessible: Accessible via walker Home Care Services: No Additional Comments: Has AE for bathroom; not sure if shower seat or bench  Discharge Living Setting Plans for Discharge Living Setting: Patient's home, Lives with (comment), Other (Comment) (wife) Type of Home at Discharge: House Discharge Home Layout: One level Discharge Home Access: Stairs to enter, Other (comment) (one small step entry) Entrance Stairs-Rails: None Entrance Stairs-Number of Steps: one small step Discharge Bathroom Shower/Tub: Tub/shower unit Discharge Bathroom Toilet: Standard Discharge Bathroom Accessibility: Yes How Accessible:  Accessible via walker Does the patient have any problems obtaining your medications?: No  Social/Family/Support Systems Patient Roles: Spouse, Parent Contact Information: Minerva Areolaric, son and pt's wife, Bonita QuinLinda Anticipated Caregiver: wife and son Anticipated Industrial/product designerCaregiver's Contact Information: see above Ability/Limitations of Caregiver: wife can provide supervision level Caregiver Availability: 24/7 Discharge Plan Discussed with Primary Caregiver: Yes Is Caregiver In Agreement with Plan?: Yes Does Caregiver/Family have Issues with Lodging/Transportation while Pt is in Rehab?: No (wife and son stay with pt 24/7 in hospital)  Goals/Additional Needs Patient/Family Goal for Rehab: supervision to min assist with PT and OT Expected length of stay: [facility] 12 to 15 days Cultural Considerations: vietnamese. Pt understands English but not speak English much. Wife and son translate. Son states he does not want or need a Nurse, learning disabilitytranslator Pt/Family Agrees to Admission and willing to participate: Yes Program Orientation Provided & Reviewed with Pt/Caregiver Including Roles  & Responsibilities: Yes  Decrease burden of Care through IP rehab admission: n/a  Possible need for SNF placement upon discharge:not anticipated and son states he will not place pt in SNF.  Patient Condition: This patient's medical and functional  status has changed since the consult dated: 02/11/2015 in which the Rehabilitation Physician determined and documented that the patient's condition is appropriate for intensive rehabilitative care in an inpatient rehabilitation facility. See "History of Present Illness" (above) for medical update. Functional changes are: moderate assist. Patient's medical and functional status update has been discussed with the Rehabilitation physician and patient remains appropriate for inpatient rehabilitation. Will admit to inpatient rehab today.  Preadmission Screen Completed By:  Clois Dupes, 02/13/2015 1:14  PM ______________________________________________________________________   Discussed status with Dr. Riley Kill on 02/13/2015 at  1314 and received telephone approval for admission today.  Admission Coordinator:  Clois Dupes, time 1314 Date 02/13/2015.

## 2015-02-13 NOTE — Discharge Summary (Signed)
Physician Discharge Summary  [name] ZOX:[id number] DOB: [birth year] DOA: 02/09/2015  PCP: Lina Sayre, MD  Admit date: 02/09/2015 Discharge date: 02/13/2015  Recommendations for Outpatient Follow-up:  1. Pt will need to follow up with PCP in 2 weeks post discharge 2. Please wean oxygenation off to keep oxygen saturation greater than 92%. 3. Follow up with Ortho, Dr. Wandra Feinstein after d/c from CIR  Discharge Diagnoses:  Right subcapital femoral neck fracture -appreciate ortho -02/10/15--R- hip pinning -pain control -WBAT R-hip per ortho -PT-->recommends CIR -ASA 325mg  per ortho for DVT prophylaxis x 30 days -d/c foley -d/c telemetry Sepsis/HCAP/acute respiratory hypoxemic failure -cannot rule out component of aspiration pneumonitis give pt's hx of dysphagia and stroke -stable on 3L [place]-->please wean oxygen off to keep oxygen saturation > 92% -4/5--d/c IV imipenem -4/4-d/c vanco -4/5--start levofloxacin 750mg  po daily x 4 days after today to finish 7 days of thearpy -BP now improved-->saline lock IVF -Lactic Acid 2.8-->0.9 -pulmonary hygiene -flutter valve -Procalcitonin 0.10-->0.42 Hypertension -hold metoprolol succinate as a patient's blood pressure is soft -restart metoprolol succinate at lower dose to 12.5 mg daily. The patient will go to inpatient rehabilitation, and his antihypertensive medication can be titrated depending upon his clinical response. -Patient was previously on metoprolol succinate 50 mg once daily. Thrombocytopenia -Check serum B12--614 -RBC folate 1022 -monitor for signs of bleeding BPH -continue avodart and flomax R-inguinal hernia -spoke with [facility]--not feasible to perform hernia surgery at same time as orthopedic surgery History of stroke -continue ASA -residual R-hemiparesis R-foot pain and edema -xray R-foot and ankle--no fracture or dislocation Microcytic anemia -check iron studies--iron sat 11% -start iron if pt is willing to take  it--give a dose of IV nulecit if pt willing to take it Constipation -+BM 4/3 after carthartic Dysphagia -speech therapy recommends dys 2 diet with thin liquids  Discharge Condition: stable  Disposition: CIR Follow-up Information    Follow up with MURPHY, TIMOTHY D, MD In 1 week.   Specialty:  Orthopedic Surgery   Contact information:   [address] [place] [address] [phone]       Diet:dysphagia 2 with thin liquids Wt Readings from Last 3 Encounters:  02/13/15 61.2 kg (134 lb 14.7 oz)  12/29/14 54.432 kg (120 lb)  04/26/09 48.716 kg (107 lb 6.4 oz)    History of present illness:  81 year old male with a history of hypertension and right hemiparesis secondary to stroke in 2000 and 2004. According to the patient's son, the patient has had a slow gradual functional and current decline since his strokes. At baseline, he is able to transfer and ambulate using a walker. Unfortunately, the patient had a mechanical fall when he missed his chair and attempt to sit down on 02/08/2015. Because of increasing pain and inability to bear weight on his right lower extremity, the patient presented to the emergency department. CT of the right hip revealed a subcapital right femoral neck fracture. Orthopedics was consulted, and patient underwent right hip pinning on 02/10/2015. The patient developed a fever and hypoxemia postoperatively on the evening of 02/10/2015. Pulmonary workup reveals right-sided pneumonia. The patient was started on intravenous imipenem and vancomycin after blood cultures were performed. Blood cultures remained negative. The patient clinically improved. His blood pressure improved. His antibiotics were de-escalated. The patient will finish 4 additional days of levofloxacin after today to complete 7 days of therapy. He remains on oxygen 3 L nasal cannula. Please wean off to keep oxygen saturation greater than 92%.  Consultants: Ortho--Dr. Wandra Feinstein PM&R   Discharge Exam: Filed Vitals:   02/13/15 0800  BP:   Pulse:   Temp:   Resp: 18   Filed Vitals:   02/13/15 0000 02/13/15 0400 02/13/15 0504 02/13/15 0800  BP:      Pulse:      Temp:      TempSrc:      Resp: 16 16  18   Weight:   61.2 kg (134 lb 14.7 oz)   SpO2: 93% 93%     General: awake and alert, NAD, pleasant, cooperative Cardiovascular: RRR, no rub, no gallop, no S3 Respiratory: Bibasilar rales. Right greater than left. No wheeze. Abdomen:soft, nontender, nondistended, positive bowel sounds Extremities: 1+LE edema, No lymphangitis, no petechiae  Discharge Instructions      Discharge Instructions    Diet - low sodium heart healthy    Complete by:  As directed      Increase activity slowly    Complete by:  As directed      Weight bearing as tolerated    Complete by:  As directed   Laterality:  right  Extremity:  Lower            Medication List    STOP taking these medications        ibuprofen 200 MG tablet  Commonly known as:  ADVIL,MOTRIN      TAKE these medications        aspirin 325 MG tablet  Take 1 tablet (325 mg total) by mouth daily.     bisacodyl 10 MG suppository  Commonly known as:  DULCOLAX  Place 1 suppository (10 mg total) rectally daily as needed for moderate constipation.     docusate sodium 100 MG capsule  Commonly known as:  COLACE  Take 1 capsule (100 mg total) by mouth 2 (two) times daily.     Dutasteride-Tamsulosin HCl 0.5-0.4 MG Caps  Take 1 capsule by mouth daily.     Hernia Support Left Medium Misc  Hernia truss. Wear as needed for support. May select variety     levofloxacin 750 MG tablet  Commonly known as:  LEVAQUIN  Take 1 tablet (750 mg total) by mouth daily.     metoprolol succinate 25 MG 24 hr tablet  Commonly known as:  TOPROL-XL  Take 0.5 tablets (12.5 mg total) by mouth daily. Take with or immediately following a meal.     ondansetron 4 MG tablet  Commonly known as:  ZOFRAN  Take 1 tablet  (4 mg total) by mouth every 8 (eight) hours as needed for nausea or vomiting.     oxyCODONE-acetaminophen 5-325 MG per tablet  Commonly known as:  PERCOCET  Take 1-2 tablets by mouth every 4 (four) hours as needed for severe pain.     senna 8.6 MG Tabs tablet  Commonly known as:  SENOKOT  Take 2 tablets (17.2 mg total) by mouth daily.         The results of significant diagnostics from this hospitalization (including imaging, microbiology, ancillary and laboratory) are listed below for reference.    Significant Diagnostic Studies: Dg Chest 1 View  02/09/2015   CLINICAL DATA:  Fall 3 days ago with chest pain, initial encounter  EXAM: CHEST  1 VIEW  COMPARISON:  12/12/2009  FINDINGS: Cardiac shadow is within normal limits. The thoracic aorta is tortuous and mildly calcified. Lungs are clear. No pneumothorax is noted. No acute bony abnormality is seen.  IMPRESSION: No acute abnormality noted.  Electronically Signed   By: Alcide Clever M.D.   On: 02/09/2015 19:00   Dg Ankle 2 Views Right  02/10/2015   CLINICAL DATA:  Fall with pain and edema. Pain aggravated by walking.  EXAM: RIGHT ANKLE - 2 VIEW  COMPARISON:  Right foot 02/10/2015  FINDINGS: Lateral soft tissue swelling. No evidence for fracture or dislocation. Normal alignment of the ankle.  IMPRESSION: No acute bone abnormality in the ankle.  Evidence for lateral soft tissue swelling.   Electronically Signed   By: Richarda Overlie M.D.   On: 02/10/2015 14:28   Ct Head Wo Contrast  02/09/2015   CLINICAL DATA:  Fall 2 days ago.  CVA with right-sided weakness.  EXAM: CT HEAD WITHOUT CONTRAST  TECHNIQUE: Contiguous axial images were obtained from the base of the skull through the vertex without intravenous contrast.  COMPARISON:  03/22/2009  FINDINGS: Sinuses/Soft tissues: Mucosal thickening of left maxillary sinus. Clear mastoid air cells. Occipital craniotomy.  Intracranial: Generalized atrophy. Moderate low density in the periventricular white matter  likely related to small vessel disease. Slightly increased. Cerebellar atrophy with encephalomalacia in the medial right cerebellar hemisphere, as before. Dolichoectasia of the right vertebral artery. No mass lesion, hemorrhage, hydrocephalus, acute infarct, intra-axial, or extra-axial fluid collection.  IMPRESSION: 1.  No acute intracranial abnormality. 2. Cerebral/cerebellar atrophy with progressive small vessel ischemic change. 3. Sinus disease and prior craniotomy.   Electronically Signed   By: Jeronimo Greaves M.D.   On: 02/09/2015 18:37   Pelvis Portable  02/10/2015   CLINICAL DATA:  Hip fracture.  EXAM: PORTABLE PELVIS 1-2 VIEWS  COMPARISON:  Yesterday's CT.  FINDINGS: Screw fixation of the previously described right femoral neck fracture. Suboptimal patient positioning, with right upper extremity projecting over the upper pelvis. Patient is also obliqued. No acute hardware complication or superimposed acute fracture identified.  IMPRESSION: Expected appearance after right proximal femoral fixation.   Electronically Signed   By: Jeronimo Greaves M.D.   On: 02/10/2015 13:55   Ct Hip Right Wo Contrast  02/09/2015   CLINICAL DATA:  Fall, right hip pain  EXAM: CT OF THE RIGHT HIP WITHOUT CONTRAST  TECHNIQUE: Multidetector CT imaging of the right hip was performed according to the standard protocol. Multiplanar CT image reconstructions were also generated.  COMPARISON:  Right hip radiographs dated 02/09/2015  FINDINGS: Subcapital right femoral neck fracture (series 6/image 15), impacted/foreshortened (series 6/image 20).  Visualized bony pelvis appears intact.  Right inguinal/scrotal hernia containing fat and small bowel (series 3/ image 13), incompletely visualized.  Foley catheter in the bladder.  Moderate stool in the rectum.  IMPRESSION: Subcapital right femoral neck fracture, impacted/foreshortened.   Electronically Signed   By: Charline Bills M.D.   On: 02/09/2015 22:03   Dg Chest Port 1 View  02/10/2015    CLINICAL DATA:  Hypoxia. Cannulated hip pinning earlier today. Abnormal lung sounds.  EXAM: PORTABLE CHEST - 1 VIEW  COMPARISON:  02/09/2015  FINDINGS: Tortuous and atherosclerotic thoracic aorta. Heart size within normal limits.  Interstitial accentuation in the right lung, especially the right lung apex, increased from prior. The left lung looks clear. No pleural effusion identified.  IMPRESSION: 1. Interstitial accentuation in the right lung appears increased. Differential diagnostic considerations include asymmetric noncardiogenic edema, aspiration pneumonitis, pulmonary embolus, or early patchy right-sided bronchopneumonia. 2. Tortuous and atherosclerotic thoracic aorta.   Electronically Signed   By: Gaylyn Rong M.D.   On: 02/10/2015 19:03   Dg Foot 2 Views Right  02/10/2015  CLINICAL DATA:  Mechanical fall with foot pain and edema.  EXAM: RIGHT FOOT - 2 VIEW  COMPARISON:  None.  FINDINGS: AP and lateral views portably. Mild osteopenia. No acute fracture or dislocation. Small calcaneal spur. Degenerative changes of the tibiotalar joint and first metatarsal phalangeal joint.  IMPRESSION: No acute osseous abnormality.  Limited two-view portable technique.   Electronically Signed   By: Jeronimo GreavesKyle  Talbot M.D.   On: 02/10/2015 14:37   Dg Hip Operative Unilat With Pelvis Right  02/10/2015   CLINICAL DATA:  ORIF of a right femoral neck fracture.  EXAM: OPERATIVE RIGHT HIP (WITH PELVIS IF PERFORMED)  VIEWS  TECHNIQUE: Fluoroscopic spot image(s) were submitted for interpretation post-operatively.  COMPARISON:  CT, 02/09/2015  FINDINGS: Subcapital fracture has been stabilized with 3 screws. There is no fracture comminution or significant displacement. There is slight residual valgus angulation.  There is no new fracture or evidence of an operative complication.  IMPRESSION: ORIF of a right subcapital femoral neck fracture.   Electronically Signed   By: Amie Portlandavid  Ormond M.D.   On: 02/10/2015 11:38   Dg Hip Unilat  With Pelvis Min 4 Views Right  02/09/2015   CLINICAL DATA:  Fall 3 days ago with right hip pain, initial encounter  EXAM: RIGHT HIP (WITH PELVIS) 4+ VIEWS  COMPARISON:  None.  FINDINGS: Pelvic ring is intact. The proximal left femur is within normal limits. Only limited evaluation of the proximal right femur is available due to the patient's inability to adequately position himself. There is some regularity of the femoral neck superimposed over the femoral head. This is suspicious for subcapital femoral neck fracture. CT may be helpful for further evaluation.  IMPRESSION: Changes suspicious for a subcapital femoral neck fracture. CT may be helpful for further evaluation given difficulty positioning the patient.   Electronically Signed   By: Alcide CleverMark  Lukens M.D.   On: 02/09/2015 19:11   Dg Femur, Min 2 Views Right  02/09/2015   CLINICAL DATA:  Fall 3 days ago with right hip and leg pain  EXAM: RIGHT FEMUR 2 VIEWS  COMPARISON:  None.  FINDINGS: Irregularity of the proximal femur is again identified suspicious for subcapital femoral neck fracture. The distal femur is within normal limits. The knee joint is unremarkable.  IMPRESSION: Changes suspicious for proximal right femoral fracture   Electronically Signed   By: Alcide CleverMark  Lukens M.D.   On: 02/09/2015 19:12     Microbiology: Recent Results (from the past 240 hour(s))  Urine culture     Status: None   Collection Time: 02/09/15  7:22 PM  Result Value Ref Range Status   Specimen Description URINE, RANDOM  Final   Special Requests NONE  Final   Colony Count   Final    80,000 COLONIES/ML Performed at [facility]    Culture   Final    Multiple bacterial morphotypes present, none predominant. Suggest appropriate recollection if clinically indicated. Performed at [facility]    Report Status 02/11/2015 FINAL  Final  Surgical pcr screen     Status: None   Collection Time: 02/10/15  6:07 AM  Result Value Ref Range Status   MRSA, PCR  NEGATIVE NEGATIVE Final   Staphylococcus aureus NEGATIVE NEGATIVE Final    Comment:        The Xpert SA Assay (FDA approved for NASAL specimens in patients over 79 years of age), is one component of a comprehensive surveillance program.  Test performance has been validated by [facility] for patients greater than or equal to 93 year old. It is not intended to diagnose infection nor to guide or monitor treatment.   Culture, blood (routine x 2)     Status: None (Preliminary result)   Collection Time: 02/10/15  7:50 PM  Result Value Ref Range Status   Specimen Description BLOOD LEFT ARM  Final   Special Requests BOTTLES DRAWN AEROBIC AND ANAEROBIC EACH  Final   Culture   Final           BLOOD CULTURE RECEIVED NO GROWTH TO DATE CULTURE WILL BE HELD FOR 5 DAYS BEFORE ISSUING A FINAL NEGATIVE REPORT Performed at [facility]    Report Status PENDING  Incomplete  Culture, blood (routine x 2)     Status: None (Preliminary result)   Collection Time: 02/10/15  7:57 PM  Result Value Ref Range Status   Specimen Description BLOOD RIGHT ARM  Final   Special Requests BOTTLES DRAWN AEROBIC AND ANAEROBIC  Final   Culture   Final           BLOOD CULTURE RECEIVED NO GROWTH TO DATE CULTURE WILL BE HELD FOR 5 DAYS BEFORE ISSUING A FINAL NEGATIVE REPORT Performed at [facility]    Report Status PENDING  Incomplete     Labs: Basic Metabolic Panel:  Recent Labs Lab 02/09/15 1807 02/10/15 0458 02/11/15 0720 02/12/15 0517 02/13/15 0723  NA 138 137 134* 138 139  K 4.6 4.1 3.7 3.5 3.7  CL 103 106 104 108 107  CO2 GLUCOSE 124* 112* 93 93 95  BUN 26* CREATININE 0.83 0.66 0.76 0.64 0.62  CALCIUM 8.9 8.4 7.7* 7.7* 7.9*  MG  --  2.1  --   --   --    Liver Function Tests:  Recent Labs Lab 02/09/15 1900 02/10/15 0458  AST 23 22  ALT 14 13  ALKPHOS 43 41  BILITOT 0.6 0.8  PROT 7.2 6.8  ALBUMIN 3.8 3.5   No results for  input(s): LIPASE, AMYLASE in the last 168 hours. No results for input(s): AMMONIA in the last 168 hours. CBC:  Recent Labs Lab 02/09/15 1807 02/10/15 0458 02/11/15 0720 02/12/15 0517  WBC 9.4 10.0 10.4 9.6  NEUTROABS 7.6 7.2 8.0*  --   HGB 11.9* 11.4* 9.4* 8.8*  HCT 38.1* 36.7* 30.1*  31.2* 28.2*  MCV 74.0* 74.1* 74.0* 73.1*  PLT 127* PLATELET CLUMPS NOTED ON SMEAR, COUNT APPEARS DECREASED PLATELET CLUMPS NOTED ON SMEAR, UNABLE TO ESTIMATE 96*   Cardiac Enzymes:  Recent Labs Lab 02/09/15 1807  TROPONINI <0.03   BNP: Invalid input(s): POCBNP CBG: No results for input(s): GLUCAP in the last 168 hours.  Time coordinating discharge:  Greater than 30 minutes  Signed:  Khamil Lamica, DO [facility] Pager: [phone] 02/13/2015, 10:29 AM

## 2015-02-13 NOTE — Progress Notes (Signed)
Report called to [facility]neyebra RN on inpt rehab. All questions answered concerning pt and his plan of care. Notified Pt and his family of transfer to rehab and room number. Pt being transferred to 5W10 at this time

## 2015-02-13 NOTE — Progress Notes (Signed)
     Subjective:  POD#3 right hip pinning. Patient reports pain as mild.  Resting comfortably in a chair.  Worked with PT/OT yesterday.  In-patient rehab was recommended.  The son and wife are agreeable to this.  The wife was very upset this morning that the staff overnight would not help her stand her husband up and help her with his care.  The wife was also upset that the patients wound was never recovered after the dressing fell off yesterday.  Objective:   VITALS:   Filed Vitals:   02/12/15 2000 02/13/15 0000 02/13/15 0400 02/13/15 0504  BP:      Pulse:      Temp:      TempSrc:      Resp: 16 16 16    Weight:    61.2 kg (134 lb 14.7 oz)  SpO2: 93% 93% 93%     Neurologically intact ABD soft Neurovascular intact Sensation intact distally Intact pulses distally Dorsiflexion/Plantar flexion intact Incision: dressing C/D/I Dressing was not present upon inspection of his wound, a new dressing has been applied.  Lab Results  Component Value Date   WBC 9.6 02/12/2015   HGB 8.8* 02/12/2015   HCT 28.2* 02/12/2015   MCV 73.1* 02/12/2015   PLT 96* 02/12/2015   BMET    Component Value Date/Time   NA 138 02/12/2015 0517   K 3.5 02/12/2015 0517   CL 108 02/12/2015 0517   CO2 25 02/12/2015 0517   GLUCOSE 93 02/12/2015 0517   BUN 10 02/12/2015 0517   CREATININE 0.64 02/12/2015 0517   CALCIUM 7.7* 02/12/2015 0517   GFRNONAA >90 02/12/2015 0517   GFRAA >90 02/12/2015 0517     Assessment/Plan: 3 Days Post-Op   Principal Problem:   Fracture of femoral neck, right Active Problems:   Tuberculosis of lung, infiltrative   Essential hypertension   Hemiparesis affecting right side as late effect of cerebrovascular accident   OVERACTIVE BLADDER   Anorexia   Inguinal hernia   Closed right hip fracture   Fractured hip   Cerebral infarction due to other mechanism   Microcytic anemia   Thrombocytopenia   Acute respiratory failure with hypoxia   HCAP (healthcare-associated  pneumonia)   Sepsis   Up with therapy WBAT in RLE ASA 325mg  daily for DVT prophylaxis for 30 days post op Stable from ortho standpoint, so ok to discharge to in-patient rehab once cleared by medicine and a bed is available.  We will follow as outpatient.  [name] [name] 02/13/2015, 7:35 AM Cell [phone]

## 2015-02-13 NOTE — Progress Notes (Signed)
PMR Admission Coordinator Pre-Admission Assessment  Patient: [name] is an 81 y.o., male MRN: [id number] DOB: 1934/07/21 Height:   Weight: 61.2 kg (134 lb 14.7 oz)  Insurance Information HMO: PPO: PCP: IPA: 80/20: yes OTHER: No HMO PRIMARY: Medicare a and b Policy#: [id number] Subscriber: pt  Employer: retired Benefits: Phone #: palmetto online Name: 02/13/2015 Eff. Date: a 09/10/01 and b 09/10/02 Deduct: $1288 Out of Pocket Max: none Life Max: none CIR: 100% SNF: 20 full days Outpatient: 80% Co-Pay: 20% Home Health: 100% Co-Pay: none DME: 80% Co-Pay: 20% Providers: pt choice  SECONDARY: [employer] of [place] Policy#: [id number] Subscriber: pt No auth required  Medicaid Application Date: Case Manager:  Disability Application Date: Case Worker:   Emergency Contact Information Contact Information    Name Relation Home Work Mobile   Screven Son [phone]    Atilla, Zollner   [phone]     Current Medical History  Patient Admitting Diagnosis: right femoral neck fracture  History of Present Illness: [name] is a 81 y.o. right handed limited English-speaking male with history of CVA 2 with right hemiparesis, hypertension. Patient lives with his wife primarily used a wheelchair prior to admission. He could ambulate short distances with a cane /walker. Presented 02/09/2015 after a recent fall from the toilet landing on his right hip. X-rays and imaging revealed right hip fracture. Underwent cannulated hip pinning 02/10/2015 per Dr. Eulah Pont. Weightbearing as tolerated right lower extremity.   Patient continues on aspirin as prior to admission. Acute blood loss anemia 9.4 and monitored. Postop chest  x-ray question aspiration pneumonia currently maintained on empiric vancomycin. Speech therapy swallow evaluation placed on a dysphagia 1 thin liquid diet.   Past Medical History  Past Medical History  Diagnosis Date  . Stroke   . Hypertension   . Enlarged prostate   . Inguinal hernia     Family History  family history is not on file.  Prior Rehab/Hospitalizations: [facility] twice after [facility]  Current Medications   Current facility-administered medications:  . 0.9 % sodium chloride infusion, , Intravenous, Once, Drema Dallas, MD, Last Rate: 10 mL/hr at 02/10/15 0120 . acetaminophen (TYLENOL) tablet 650 mg, 650 mg, Oral, Q6H PRN, 650 mg at 02/10/15 2047 **OR** acetaminophen (TYLENOL) suppository 650 mg, 650 mg, Rectal, Q6H PRN, Drema Dallas, MD . aspirin EC tablet 325 mg, 325 mg, Oral, Daily, Catarina Hartshorn, MD, 325 mg at 02/13/15 1229 . bisacodyl (DULCOLAX) suppository 10 mg, 10 mg, Rectal, Daily PRN, Catarina Hartshorn, MD, 10 mg at 02/11/15 2030 . docusate sodium (COLACE) capsule 100 mg, 100 mg, Oral, BID, Drema Dallas, MD, 100 mg at 02/13/15 1230 . dutasteride (AVODART) capsule 0.5 mg, 0.5 mg, Oral, Daily, 0.5 mg at 02/13/15 1230 **AND** tamsulosin (FLOMAX) capsule 0.4 mg, 0.4 mg, Oral, Daily, Drema Dallas, MD, 0.4 mg at 02/13/15 1230 . HYDROcodone-acetaminophen (NORCO/VICODIN) 5-325 MG per tablet 1-2 tablet, 1-2 tablet, Oral, Q6H PRN, Catarina Hartshorn, MD, 2 tablet at 02/13/15 0650 . levofloxacin (LEVAQUIN) tablet 750 mg, 750 mg, Oral, Daily, David Tat, MD . menthol-cetylpyridinium (CEPACOL) lozenge 3 mg, 1 lozenge, Oral, PRN **OR** phenol (CHLORASEPTIC) mouth spray 1 spray, 1 spray, Mouth/Throat, PRN, Catarina Hartshorn, MD . morphine 2 MG/ML injection 1-3 mg, 1-3 mg, Intravenous, Q2H PRN, Drema Dallas, MD, 1 mg at 02/10/15 2001 . oxyCODONE (Oxy IR/ROXICODONE) immediate release tablet 5 mg, 5 mg, Oral, Q4H PRN, Drema Dallas, MD, 5 mg at 02/13/15 0857 . senna (SENOKOT) tablet  17.2 mg,  2 tablet, Oral, Daily, Catarina Hartshorn, MD, 17.2 mg at 02/13/15 1230 . zolpidem (AMBIEN) tablet 5 mg, 5 mg, Oral, QHS PRN, Drema Dallas, MD  Patients Current Diet: DIET DYS 2 Room service appropriate?: Yes; Fluid consistency:: Thin Diet - low sodium heart healthy  Precautions / Restrictions Precautions Precautions: Fall Precaution Comments: hx Right hemiparesis Restrictions Weight Bearing Restrictions: Yes RLE Weight Bearing: Weight bearing as tolerated   Prior Activity Level Limited Community (1-2x/wk): used hover round for mobility. Did self transfers. Could walk with cane about 10 feet with shuffling gait Did own adls with wife providing supervision only  Journalist, newspaper / Equipment Home Equipment: Wheelchair - power, Tub bench, Environmental consultant - 2 wheels, Bedside commode  Prior Functional Level Prior Function Level of Independence: Needs assistance Gait / Transfers Assistance Needed: did own transfers, walked with cane and shuffled 10 feet from home to car with son, self transfers to tub ADL's / Homemaking Assistance Needed: pt is independent with bathing and dressing at baseline with wife supervising shower transfer Communication / Swallowing Assistance Needed: Understands English, at times benefitted from wife interpreting [place]  Current Functional Level Cognition  Overall Cognitive Status: Within Functional Limits for tasks assessed Orientation Level: Oriented X4   Extremity Assessment (includes Sensation/Coordination)  Upper Extremity Assessment: RUE deficits/detail, Generalized weakness RUE Deficits / Details: hx of Right hemiparesis. RUE with some slow movement and able to reach to hold RW with assistance.   Lower Extremity Assessment: Defer to PT evaluation RLE Deficits / Details: Decr AROM and strength, limited by pain postop; also with history of R hemiparesis, and noted tendency to hold hip in grossly flexed; difficulty fully extending hip, but able to  actively move in all plnaes    ADLs  Overall ADL's : Needs assistance/impaired Eating/Feeding: Set up, Sitting Grooming: Set up, Sitting Upper Body Bathing: Set up, Sitting (increased time) Lower Body Bathing: Moderate assistance, +2 for physical assistance, Sit to/from stand Upper Body Dressing : Minimal assistance, Sitting Lower Body Dressing: Maximal assistance, +2 for physical assistance, Sit to/from stand Toilet Transfer: Moderate assistance, +2 for safety/equipment, Stand-pivot, RW Toileting- Clothing Manipulation and Hygiene: Maximal assistance, Sitting/lateral lean Toileting - Clothing Manipulation Details (indicate cue type and reason): simulated during transfer General ADL Comments: wife is a huge asset! very eager and able to assist with equipment as needed also translating.     Mobility  Overal bed mobility: Needs Assistance Bed Mobility: Sit to Supine Supine to sit: +2 for physical assistance, Mod assist Sit to supine: +2 for physical assistance, Mod assist General bed mobility comments: verbal and tactile cues for technique. Required assist w/ trunk posteriorly and w/ bringing BLEs into bed.    Transfers  Overall transfer level: Needs assistance Equipment used: Rolling walker (2 wheeled) Transfers: Sit to/from Stand Sit to Stand: +2 physical assistance, +2 safety/equipment, Mod assist Stand pivot transfers: Mod assist, +2 safety/equipment General transfer comment: Cues for hand placement on armrests. Assist to stand upright w/ pt leaning posteriorly w/ slight loss of balance.    Ambulation / Gait / Stairs / Wheelchair Mobility  Ambulation/Gait Ambulation/Gait assistance: +2 physical assistance, +2 safety/equipment, Mod assist Ambulation Distance (Feet): 8 Feet Assistive device: Rolling walker (2 wheeled) Gait Pattern/deviations: Step-to pattern, Decreased stride length, Decreased stance time - right, Shuffle, Antalgic, Leaning posteriorly, Trunk flexed,  Narrow base of support Gait velocity interpretation: Below normal speed for age/gender General Gait Details: Pt's trunk flexed w/ narrow base of support and B knee in flexion w/  posterior lean of trunk. Verbal cues to stand upright w/ slight improvement this session and only min verbal cues for proper sequencing of BLEs while ambulating to bed.    Posture / Balance Dynamic Sitting Balance Sitting balance - Comments: initially needing support at trunk, but able to organize and not need support Balance Overall balance assessment: Needs assistance Sitting-balance support: Bilateral upper extremity supported, Feet supported Sitting balance-Leahy Scale: Fair Sitting balance - Comments: initially needing support at trunk, but able to organize and not need support Postural control: Posterior lean Standing balance support: Bilateral upper extremity supported, During functional activity Standing balance-Leahy Scale: Poor    Special needs/care consideration Oxygen O 2 at 2 liters [place] Skin surgical dressing Bowel mgmt: BM 02/12/15 Bladder mgmt: continent Wife and son translate for pt. Son states they prefer no translator    Previous Home Environment Living Arrangements: Spouse/significant other Lives With: Spouse Available Help at Discharge: Family, Available 24 hours/day Type of Home: House Home Layout: One level Home Access: Other (comment) (small one step entry) Bathroom Shower/Tub: Engineer, manufacturing systems: Standard Bathroom Accessibility: Yes How Accessible: Accessible via walker Home Care Services: No Additional Comments: Has AE for bathroom; not sure if shower seat or bench  Discharge Living Setting Plans for Discharge Living Setting: Patient's home, Lives with (comment), Other (Comment) (wife) Type of Home at Discharge: House Discharge Home Layout: One level Discharge Home Access: Stairs to enter, Other (comment) (one small step entry) Entrance Stairs-Rails:  None Entrance Stairs-Number of Steps: one small step Discharge Bathroom Shower/Tub: Tub/shower unit Discharge Bathroom Toilet: Standard Discharge Bathroom Accessibility: Yes How Accessible: Accessible via walker Does the patient have any problems obtaining your medications?: No  Social/Family/Support Systems Patient Roles: Spouse, Parent Contact Information: [name], son and pt's wife, Bonita Quin Anticipated Caregiver: wife and son Anticipated Industrial/product designer Information: see above Ability/Limitations of Caregiver: wife can provide supervision level Caregiver Availability: 24/7 Discharge Plan Discussed with Primary Caregiver: Yes Is Caregiver In Agreement with Plan?: Yes Does Caregiver/Family have Issues with Lodging/Transportation while Pt is in Rehab?: No (wife and son stay with pt 24/7 in hospital)  Goals/Additional Needs Patient/Family Goal for Rehab: supervision to min assist with PT and OT Expected length of stay: [facility] 12 to 15 days Cultural Considerations: vietnamese. Pt understands English but not speak English much. Wife and son translate. Son states he does not want or need a Nurse, learning disability Pt/Family Agrees to Admission and willing to participate: Yes Program Orientation Provided & Reviewed with Pt/Caregiver Including Roles & Responsibilities: Yes  Decrease burden of Care through IP rehab admission: n/a  Possible need for SNF placement upon discharge:not anticipated and son states he will not place pt in SNF.  Patient Condition: This patient's medical and functional status has changed since the consult dated: 02/11/2015 in which the Rehabilitation Physician determined and documented that the patient's condition is appropriate for intensive rehabilitative care in an inpatient rehabilitation facility. See "History of Present Illness" (above) for medical update. Functional changes are: moderate assist. Patient's medical and functional status update has been discussed with the  Rehabilitation physician and patient remains appropriate for inpatient rehabilitation. Will admit to inpatient rehab today.  Preadmission Screen Completed By: Clois Dupes, 02/13/2015 1:14 PM ______________________________________________________________________  Discussed status with Dr. Riley Kill on 02/13/2015 at 1314 and received telephone approval for admission today.  Admission Coordinator: Clois Dupes, time 1314 Date 02/13/2015.          Cosigned by: Ranelle Oyster, MD at 02/13/2015 1:41 PM  Revision History     Date/Time User Provider Type Action   02/13/2015 1:41 PM Ranelle OysterZachary T Swartz, MD Physician Cosign   02/13/2015 1:14 PM Standley BrookingBarbara G Teofilo Lupinacci, RN Rehab Admission Coordinator Sign

## 2015-02-13 NOTE — Progress Notes (Addendum)
Inpt rehab bed is available for pt 's admission today. I have contacted Dr. Arbutus Leasat to request clarification of medical readiness to admit today. I await call back. [phone] Dr. Arbutus Leasat confirms that pt can be admitted to inpt rehab today. I will make the arrangements.

## 2015-02-13 NOTE — Progress Notes (Signed)
Ranelle OysterZachary T Swartz, MD Physician Signed Physical Medicine and Rehabilitation Consult Note 02/12/2015 6:03 AM  Related encounter: ED to Hosp-Admission (Current) [facility]    Expand All Collapse All        Physical Medicine and Rehabilitation Consult Reason for Consult: Right hip fracture Referring Physician: [facility]   HPI: [name] is a 81 y.o. right handed limited English-speaking male with history of CVA 2 with right hemiparesis, hypertension. Patient lives with his wife primarily used a wheelchair prior to admission. He could ambulate short distances with a cane /walker. Presented 02/09/2015 after a recent fall from the toilet landing on his right hip. X-rays and imaging revealed right hip fracture. Underwent cannulated hip pinning 02/10/2015 per Dr. Eulah PontMurphy. Weightbearing as tolerated right lower extremity. Hospital course pain management. Patient continues on aspirin as prior to admission. Acute blood loss anemia 9.4 and monitored. Postop chest x-ray question aspiration pneumonia currently maintained on empiric vancomycin. Speech therapy swallow evaluation placed on a dysphagia 1 thin liquid diet. Physical and occupational therapy evaluations completed 02/11/2015 with recommendations of physical medicine rehabilitation consult  Review of Systems  Gastrointestinal: Positive for constipation.  Genitourinary: Positive for urgency.  Musculoskeletal: Positive for myalgias.  Neurological:   Right side weakness   Past Medical History  Diagnosis Date  . Stroke   . Hypertension   . Enlarged prostate   . Inguinal hernia    Past Surgical History  Procedure Laterality Date  . Brain surgery     History reviewed. No pertinent family history. Social History:  reports that he has never smoked. He has never used smokeless tobacco. He reports that he does not drink alcohol or use illicit drugs. Allergies:   Allergies  Allergen Reactions  . Penicillins Hives   Medications Prior to Admission  Medication Sig Dispense Refill  . Dutasteride-Tamsulosin HCl 0.5-0.4 MG CAPS Take 1 capsule by mouth daily.    Marland Kitchen. ibuprofen (ADVIL,MOTRIN) 200 MG tablet Take 400 mg by mouth every 6 (six) hours as needed for moderate pain.    . metoprolol succinate (TOPROL-XL) 50 MG 24 hr tablet Take 50 mg by mouth daily. Take with or immediately following a meal.    . Elastic Bandages & Supports (HERNIA SUPPORT LEFT MEDIUM) MISC Hernia truss. Wear as needed for support. May select variety 1 each 0    Home: Home Living Family/patient expects to be discharged to:: Private residence Living Arrangements: Spouse/significant other Available Help at Discharge: Family, Available 24 hours/day Type of Home: House Home Access: Other (comment) (small step? mostly level) Home Layout: One level Home Equipment: Wheelchair - power, Tub bench, Walker - 2 wheels, Bedside commode Additional Comments: Has AE for bathroom; not sure if shower seat or bench  Functional History: Prior Function Level of Independence: Needs assistance Gait / Transfers Assistance Needed: mostly transfers to his power wc; reports was able to take a few steps and sometimes would walk in room distances (not sure if with RW or not) ADL's / Homemaking Assistance Needed: pt is independent with bathing and dressing at baseline with wife supervising shower transfer Communication / Swallowing Assistance Needed: Understands English, at times benefitted from wife interpreting [place] Functional Status:  Mobility: Bed Mobility Overal bed mobility: Needs Assistance Bed Mobility: Supine to Sit Supine to sit: +2 for physical assistance, Mod assist General bed mobility comments: verbal and tactile cues for technqiue; required assist to elevat trunk and used bed pad to square off hip sat  EOB Transfers Overall transfer level: Needs  assistance Equipment used: 1 person hand held assist, 2 person hand held assist, Rolling walker (2 wheeled) Transfers: Sit to/from Stand, Stand Pivot Transfers Sit to Stand: Mod assist, +2 safety/equipment Stand pivot transfers: Mod assist, +2 safety/equipment General transfer comment: Cues for technique, hand palcement and safety; overall good initiationa nd rise on LLE; max verbal and hand over hand cues to reach for far armrest during pivot transfer; assist to guide hips to chair; Good rise with dit to stand to RW; assist to place RUE on RW, but noted good hand opening and reaching for grip; at least mod assist to ease descent to chair Ambulation/Gait Ambulation/Gait assistance: +2 physical assistance, Mod assist Ambulation Distance (Feet): 5 Feet Assistive device: Rolling walker (2 wheeled) Gait Pattern/deviations: Step-to pattern, Decreased step length - right, Decreased step length - left, Trunk flexed, Antalgic General Gait Details: Pt very motivated and wanting to try walking; step-by-step cues for gait sequence; mod verbal and tactile cueing for upright posture and to work on extend both snees fully straight in standing; extremely short steps with very small base of support; needing extra support at gait belt while advancing LLE    ADL: ADL Overall ADL's : Needs assistance/impaired Eating/Feeding: Set up, Sitting Grooming: Set up, Sitting Upper Body Bathing: Set up, Sitting (increased time) Lower Body Bathing: Moderate assistance, +2 for physical assistance, Sit to/from stand Upper Body Dressing : Minimal assistance, Sitting Lower Body Dressing: Maximal assistance, +2 for physical assistance, Sit to/from stand Toilet Transfer: Moderate assistance, +2 for safety/equipment, Stand-pivot, RW General ADL Comments: Pt seemed disappointed in his limited mobility.   Cognition: Cognition Overall Cognitive Status: Within Functional Limits for tasks assessed Orientation Level: Oriented  X4 Cognition Arousal/Alertness: Awake/alert Behavior During Therapy: WFL for tasks assessed/performed Overall Cognitive Status: Within Functional Limits for tasks assessed  Blood pressure 126/65, pulse 100, temperature 99.2 F (37.3 C), temperature source Oral, resp. rate 16, weight 60.6 kg (133 lb 9.6 oz), SpO2 94 %. Physical Exam  Vitals reviewed. HENT:  Head: Normocephalic.  Eyes: EOM are normal.  Neck: Normal range of motion. Neck supple. No thyromegaly present.  Cardiovascular: Normal rate and regular rhythm.  Respiratory: Effort normal and breath sounds normal. No respiratory distress.  GI: Soft. Bowel sounds are normal. He exhibits no distension.  Musculoskeletal:  Right hip dressed  Neurological: He is alert.  Patient can provide his name and age. Appropriate for yes and no. Follows simple commands. RUE 2 to 2+/5. RLE 1-2/5. LUE 4+/5. Alert and generally appropriate  Skin:  Right hip dressing intact clean and dry appropriately tender  Psychiatric: He has a normal mood and affect. His behavior is normal.     Lab Results Last 24 Hours    Results for orders placed or performed during the hospital encounter of 02/09/15 (from the past 24 hour(s))  CBC with Differential/Platelet Status: Abnormal   Collection Time: 02/11/15 7:20 AM  Result Value Ref Range   WBC 10.4 4.0 - 10.5 K/uL   RBC 4.07 (L) 4.22 - 5.81 MIL/uL   Hemoglobin 9.4 (L) 13.0 - 17.0 g/dL   HCT 16.1 (L) 09.6 - 04.5 %   MCV 74.0 (L) 78.0 - 100.0 fL   MCH 23.1 (L) 26.0 - 34.0 pg   MCHC 31.2 30.0 - 36.0 g/dL   RDW 40.9 81.1 - 91.4 %   Platelets PLATELET CLUMPS NOTED ON SMEAR, UNABLE TO ESTIMATE 150 - 400 K/uL   Neutrophils Relative % 77 43 -  77 %   Lymphocytes Relative 8 (L) 12 - 46 %   Monocytes Relative 13 (H) 3 - 12 %   Eosinophils Relative 2 0 - 5 %   Basophils Relative 0 0 - 1 %   Neutro Abs 8.0 (H) 1.7 - 7.7 K/uL   Lymphs Abs  0.8 0.7 - 4.0 K/uL   Monocytes Absolute 1.4 (H) 0.1 - 1.0 K/uL   Eosinophils Absolute 0.2 0.0 - 0.7 K/uL   Basophils Absolute 0.0 0.0 - 0.1 K/uL   Smear Review MORPHOLOGY UNREMARKABLE   Basic metabolic panel Status: Abnormal   Collection Time: 02/11/15 7:20 AM  Result Value Ref Range   Sodium 134 (L) 135 - 145 mmol/L   Potassium 3.7 3.5 - 5.1 mmol/L   Chloride 104 96 - 112 mmol/L   CO2 27 19 - 32 mmol/L   Glucose, Bld 93 70 - 99 mg/dL   BUN 16 6 - 23 mg/dL   Creatinine, Ser 0.45 0.50 - 1.35 mg/dL   Calcium 7.7 (L) 8.4 - 10.5 mg/dL   GFR calc non Af Amer 85 (L) >90 mL/min   GFR calc Af Amer >90 >90 mL/min   Anion gap 3 (L) 5 - 15  Vitamin B12 Status: None   Collection Time: 02/11/15 7:20 AM  Result Value Ref Range   Vitamin B-12 614 211 - 911 pg/mL  Iron and TIBC Status: Abnormal   Collection Time: 02/11/15 7:20 AM  Result Value Ref Range   Iron 17 (L) 42 - 165 ug/dL   TIBC 409 (L) 811 - 914 ug/dL   Saturation Ratios 11 (L) 20 - 55 %   UIBC 143 125 - 400 ug/dL  Ferritin Status: Abnormal   Collection Time: 02/11/15 7:20 AM  Result Value Ref Range   Ferritin 427 (H) 22 - 322 ng/mL  Lactic acid, plasma Status: None   Collection Time: 02/11/15 8:14 AM  Result Value Ref Range   Lactic Acid, Venous 0.9 0.5 - 2.0 mmol/L  Protime-INR Status: Abnormal   Collection Time: 02/11/15 8:14 AM  Result Value Ref Range   Prothrombin Time 16.2 (H) 11.6 - 15.2 seconds   INR 1.28 0.00 - 1.49  APTT Status: Abnormal   Collection Time: 02/11/15 8:14 AM  Result Value Ref Range   aPTT 43 (H) 24 - 37 seconds  Lactic acid, plasma Status: None   Collection Time: 02/11/15 2:45 PM  Result Value Ref Range   Lactic Acid, Venous 1.9 0.5 - 2.0 mmol/L      Imaging Results (Last 48 hours)    Dg Ankle 2 Views  Right  02/10/2015 CLINICAL DATA: Fall with pain and edema. Pain aggravated by walking. EXAM: RIGHT ANKLE - 2 VIEW COMPARISON: Right foot 02/10/2015 FINDINGS: Lateral soft tissue swelling. No evidence for fracture or dislocation. Normal alignment of the ankle. IMPRESSION: No acute bone abnormality in the ankle. Evidence for lateral soft tissue swelling. Electronically Signed By: Richarda Overlie M.D. On: 02/10/2015 14:28   Pelvis Portable  02/10/2015 CLINICAL DATA: Hip fracture. EXAM: PORTABLE PELVIS 1-2 VIEWS COMPARISON: Yesterday's CT. FINDINGS: Screw fixation of the previously described right femoral neck fracture. Suboptimal patient positioning, with right upper extremity projecting over the upper pelvis. Patient is also obliqued. No acute hardware complication or superimposed acute fracture identified. IMPRESSION: Expected appearance after right proximal femoral fixation. Electronically Signed By: Jeronimo Greaves M.D. On: 02/10/2015 13:55   Dg Chest Port 1 View  02/10/2015 CLINICAL DATA: Hypoxia. Cannulated hip pinning earlier today. Abnormal lung  sounds. EXAM: PORTABLE CHEST - 1 VIEW COMPARISON: 02/09/2015 FINDINGS: Tortuous and atherosclerotic thoracic aorta. Heart size within normal limits. Interstitial accentuation in the right lung, especially the right lung apex, increased from prior. The left lung looks clear. No pleural effusion identified. IMPRESSION: 1. Interstitial accentuation in the right lung appears increased. Differential diagnostic considerations include asymmetric noncardiogenic edema, aspiration pneumonitis, pulmonary embolus, or early patchy right-sided bronchopneumonia. 2. Tortuous and atherosclerotic thoracic aorta. Electronically Signed By: Gaylyn Rong M.D. On: 02/10/2015 19:03   Dg Foot 2 Views Right  02/10/2015 CLINICAL DATA: Mechanical fall with foot pain and edema. EXAM: RIGHT FOOT - 2 VIEW COMPARISON: None. FINDINGS: AP and  lateral views portably. Mild osteopenia. No acute fracture or dislocation. Small calcaneal spur. Degenerative changes of the tibiotalar joint and first metatarsal phalangeal joint. IMPRESSION: No acute osseous abnormality. Limited two-view portable technique. Electronically Signed  By: Jeronimo Greaves M.D. On: 02/10/2015 14:37   Dg Hip Operative Unilat With Pelvis Right  02/10/2015 CLINICAL DATA: ORIF of a right femoral neck fracture. EXAM: OPERATIVE RIGHT HIP (WITH PELVIS IF PERFORMED) VIEWS TECHNIQUE: Fluoroscopic spot image(s) were submitted for interpretation post-operatively. COMPARISON: CT, 02/09/2015 FINDINGS: Subcapital fracture has been stabilized with 3 screws. There is no fracture comminution or significant displacement. There is slight residual valgus angulation. There is no new fracture or evidence of an operative complication. IMPRESSION: ORIF of a right subcapital femoral neck fracture. Electronically Signed By: Amie Portland M.D. On: 02/10/2015 11:38     Assessment/Plan: Diagnosis: right femoral neck fx 1. Does the need for close, 24 hr/day medical supervision in concert with the patient's rehab needs make it unreasonable for this patient to be served in a less intensive setting? Yes 2. Co-Morbidities requiring supervision/potential complications: hx of cva, htn 3. Due to bladder management, bowel management, safety, skin/wound care, disease management, medication administration, pain management and patient education, does the patient require 24 hr/day rehab nursing? Yes 4. Does the patient require coordinated care of a physician, rehab nurse, PT (1-2 hrs/day, 5 days/week) and OT (1-2 hrs/day, 5 days/week) to address physical and functional deficits in the context of the above medical diagnosis(es)? Yes Addressing deficits in the following areas: balance, endurance, locomotion, strength, transferring, bowel/bladder control, bathing, dressing, feeding, grooming,  toileting and psychosocial support 5. Can the patient actively participate in an intensive therapy program of at least 3 hrs of therapy per day at least 5 days per week? Yes 6. The potential for patient to make measurable gains while on inpatient rehab is excellent 7. Anticipated functional outcomes upon discharge from inpatient rehab are supervision to min assist with PT, supervision and min assist with OT, n/a with SLP. 8. Estimated rehab length of stay to reach the above functional goals is: 12-15 days 9. Does the patient have adequate social supports and living environment to accommodate these discharge functional goals? Yes 10. Anticipated D/C setting: Home 11. Anticipated post D/C treatments: HH therapy 12. Overall Rehab/Functional Prognosis: excellent  RECOMMENDATIONS: This patient's condition is appropriate for continued rehabilitative care in the following setting: CIR Patient has agreed to participate in recommended program. Yes Note that insurance prior authorization may be required for reimbursement for recommended care.  Comment: Rehab Admissions Coordinator to follow up.  Thanks,  Ranelle Oyster, MD, [facility]     02/12/2015       Revision History     Date/Time User Provider Type Action   02/12/2015 9:47 AM Ranelle Oyster, MD Physician Sign   02/12/2015 6:40 AM Mcarthur Rossetti  Angiulli, PA-C Physician Assistant [facility] Details Report       Routing History     Date/Time From To Method   02/12/2015 9:47 AM Ranelle Oyster, MD Ranelle Oyster, MD In Basket   02/12/2015 9:47 AM Ranelle Oyster, MD Lina Sayre, MD In Basket

## 2015-02-14 ENCOUNTER — Inpatient Hospital Stay (HOSPITAL_COMMUNITY): Payer: Medicare Other | Admitting: Physical Therapy

## 2015-02-14 ENCOUNTER — Inpatient Hospital Stay (HOSPITAL_COMMUNITY): Payer: Medicare Other

## 2015-02-14 DIAGNOSIS — M7989 Other specified soft tissue disorders: Secondary | ICD-10-CM

## 2015-02-14 MED ORDER — BACLOFEN 5 MG HALF TABLET
5.0000 mg | ORAL_TABLET | Freq: Two times a day (BID) | ORAL | Status: DC
  Administered 2015-02-14 (×2): 5 mg via ORAL
  Filled 2015-02-14 (×4): qty 1

## 2015-02-14 MED ORDER — FLUOCINONIDE-E 0.05 % EX CREA
TOPICAL_CREAM | Freq: Three times a day (TID) | CUTANEOUS | Status: DC
  Administered 2015-02-14 – 2015-02-22 (×20): via TOPICAL
  Filled 2015-02-14 (×2): qty 15

## 2015-02-14 NOTE — Progress Notes (Signed)
Patient information reviewed and entered into eRehab system by Lanyia Jewel, RN, CRRN, PPS Coordinator.  Information including medical coding and functional independence measure will be reviewed and updated through discharge.     Per nursing patient was given "Data Collection Information Summary for Patients in Inpatient Rehabilitation Facilities with attached "Privacy Act Statement-Health Care Records" upon admission.  

## 2015-02-14 NOTE — Progress Notes (Signed)
Occupational Therapy Note  Patient Details  Name: [name] LagosJimmy L Lhommedieu MRN: [id number] Date of Birth: 12/27/1933  Today's Date: 02/14/2015 OT Individual Time: 1100-1130 OT Individual Time Calculation (min): 30 min   Pt denied pain Individual Therapy  Pt resting in bed with son present.  Pt engaged in bed mobility, squat pivot transfers, sit<>stand, standing balance, and educated on safety.  Pt engaged in grooming tasks w/c level at sink with sit<>stand.     Lavone NeriLanier, Jenaveve Fenstermaker [facility] Center[facility]appell 02/14/2015, 12:15 PM

## 2015-02-14 NOTE — Progress Notes (Signed)
[facility] PHYSICAL MEDICINE & REHABILITATION     PROGRESS NOTE    Subjective/Complaints: Had to void a few times over night (wife helped with this mostly). Wants his recliner to be closer to bathroom. Denies pain  Objective: Vital Signs: Blood pressure 138/68, pulse 75, temperature 98.3 F (36.8 C), temperature source Oral, SpO2 97 %. No results found.  Recent Labs  02/12/15 0517 02/13/15 1822  WBC 9.6 7.7  HGB 8.8* 9.8*  HCT 28.2* 31.2*  PLT 96* 107*    Recent Labs  02/13/15 0723 02/13/15 1822  NA 139 137  K 3.7 3.7  CL 107 105  GLUCOSE 95 107*  BUN 9 7  CREATININE 0.62 0.61  CALCIUM 7.9* 7.9*   CBG (last 3)  No results for input(s): GLUCAP in the last 72 hours.  Wt Readings from Last 3 Encounters:  02/13/15 61.2 kg (134 lb 14.7 oz)  12/29/14 54.432 kg (120 lb)  04/26/09 48.716 kg (107 lb 6.4 oz)    Physical Exam:   HENT: oral mucosa pink and moist Head: Normocephalic.  Eyes: EOM are normal.  Neck: Normal range of motion. Neck supple. No thyromegaly present.  Cardiovascular: Normal rate and regular rhythm.  Respiratory: Effort normal and breath sounds normal. No respiratory distress.  GI: Soft. Bowel sounds are normal. He exhibits no distension.  Musculoskeletal:  Right hip dressed, thigh tender to palpation and ROM. Adhesive capsulitis right shoulder--30 degrees abd Neurological: He is alert.  Patient can provide his name and age. Appropriate for yes and no. Follows simple commands. RUE 2 to 2+/5. RLE 1-2/5. LUE 4+/5. Alert and generally appropriate.  Tone:  MAS 2-3 throughout the RUE. RLE MAS 1-2 Skin:  Right hip wound remains intact clean and dry  Psychiatric: He has a normal mood and affect. His behavior is normal. Cooperative. Fair insight and awareness. Language barrier Assessment/Plan: 1. Functional deficits secondary to right femoral neck fracture which require 3+ hours per day of interdisciplinary therapy in a comprehensive inpatient  rehab setting. Physiatrist is providing close team supervision and 24 hour management of active medical problems listed below. Physiatrist and rehab team continue to assess barriers to discharge/monitor patient progress toward functional and medical goals. FIM:                   Comprehension Comprehension Mode: Auditory Comprehension: 1-Understands basic less than 25% of the time/requires cueing 75% of the time (language barrier)  Expression Expression Mode: Verbal Expression: 5-Expresses basic 90% of the time/requires cueing < 10% of the time.  Social Interaction Social Interaction: 4-Interacts appropriately 75 - 89% of the time - Needs redirection for appropriate language or to initiate interaction.  Problem Solving Problem Solving: 5-Solves basic 90% of the time/requires cueing < 10% of the time  Memory Memory: 4-Recognizes or recalls 75 - 89% of the time/requires cueing 10 - 24% of the time  Medical Problem List and Plan: 1. Functional deficits secondary to right femoral neck fracture. Status post cannulated hip pinning 02/10/2015. Weightbearing as tolerated  2. DVT Prophylaxis/Anticoagulation: SCDs. Check vascular study today 3. Pain Management: Oxycodone and Robaxin as needed. Monitor with decreased mobility 4. Acute blood loss anemia. Follow-up bloodwork 5. Neuropsych: This patient is capable of making decisions on his own behalf. 6. Skin/Wound Care: Routine skin checks 7. Fluids/Electrolytes/Nutrition: encourage po, regular labs 8. History of CVA 2 with right hemiparesis. Continue aspirin 9. History of Hypertension. Patient on Toprol 50 mg daily prior to admission. Resume as tolerated 10. BPH.  Avodart/Flomax. Check PVRs 3, voiding fairly frequently 11.HCAP. Levaquin 3 days, afebrile currently 12. Spastic right hemiparesis, adhesive capsulitis right shoulder  - aggressive ROM. Will try low dose baclofen to see how he responds  - botox is a  consideration---probably more an outpt option however LOS (Days) 1 A FACE TO FACE EVALUATION WAS PERFORMED  Roopa Graver T 02/14/2015 7:38 AM

## 2015-02-14 NOTE — Evaluation (Signed)
Physical Therapy Assessment and Plan  Patient Details  Name: [name] MRN: [id number] Date of Birth: 10-26-1934  PT Diagnosis: Abnormal posture, Abnormality of gait, Difficulty walking, Hemiplegia dominant, Impaired sensation, Muscle weakness and Pain in joint Rehab Potential: Good ELOS: 10-12 days   Today's Date: 02/14/2015 PT Individual Time: 0800-0900 PT Individual Time Calculation (min): 60 min    Problem List:  Patient Active Problem List   Diagnosis Date Noted  . Femoral neck fracture 02/13/2015  . Acute respiratory failure with hypoxia 02/11/2015  . HCAP (healthcare-associated pneumonia) 02/11/2015  . Sepsis 02/11/2015  . Microcytic anemia 02/10/2015  . Thrombocytopenia 02/10/2015  . Cerebral infarction due to other mechanism   . Inguinal hernia 02/09/2015  . Fracture of femoral neck, right 02/09/2015  . Closed right hip fracture 02/09/2015  . Fractured hip 02/09/2015  . Tuberculosis of lung, infiltrative 04/26/2009  . Essential hypertension 04/26/2009  . Right spastic hemiparesis 04/26/2009  . OVERACTIVE BLADDER 04/26/2009  . Anorexia 04/26/2009    Past Medical History:  Past Medical History  Diagnosis Date  . Stroke   . Hypertension   . Enlarged prostate   . Inguinal hernia    Past Surgical History:  Past Surgical History  Procedure Laterality Date  . Brain surgery    . Hip pinning,cannulated Right 02/10/2015    Procedure: CANNULATED HIP PINNING;  Surgeon: Renette Butters, MD;  Location: [place];  Service: Orthopedics;  Laterality: Right;    Assessment & Plan Clinical Impression: [name] is a 81 y.o. right handed limited English-speaking male with history of CVA 2 with right hemiparesis and well known to rehabilitation services, hypertension. Patient lives with his wife primarily used a wheelchair prior to admission. He could ambulate short distances with a cane /walker. Presented 02/09/2015 after a recent fall from the toilet landing on his  right hip. X-rays and imaging revealed right hip fracture. Underwent cannulated hip pinning 02/10/2015 per Dr. Percell Miller. Weightbearing as tolerated right lower extremity. Hospital course pain management. Patient continues on aspirin as prior to admission. Acute blood loss anemia 8.8 and monitored.Marland Kitchen Postoperative chest x-ray shows suspected right sided pneumonia and currently maintained on Primaxin since 02/10/2015 with blood cultures negative and changed to Levaquin for 5 2016. Speech therapy swallow evaluation placed on a dysphagia 2 thin liquid diet. Physical and occupational therapy evaluations completed 02/11/2015 with recommendations of physical medicine rehabilitation consult. Patient transferred to CIR on 02/13/2015.   Patient currently requires min-mod with mobility secondary to pain, muscle weakness, decreased cardiorespiratory endurance, decreased balance, decreased balance strategies, and baseline RUE/LE spastic hemiplegia.  Prior to hospitalization, patient was supervision-mod I with mobility and lived with Spouse in a House home.  Home access is small thresholdOther (comment), Stairs to enter.  Patient will benefit from skilled PT intervention to maximize safe functional mobility, minimize fall risk and decrease caregiver burden for planned discharge home with 24 hour supervision.  Anticipate patient will benefit from follow up [place] at discharge.  PT - End of Session Activity Tolerance: Decreased this session;Tolerates 30+ min activity with multiple rests Endurance Deficit: Yes Endurance Deficit Description: requests frequent rest breaks PT Assessment Rehab Potential (ACUTE/IP ONLY): Good PT Patient demonstrates impairments in the following area(s): Balance;Endurance;Motor;Pain;Safety;Sensory PT Transfers Functional Problem(s): Bed Mobility;Bed to Chair;Car;Furniture PT Locomotion Functional Problem(s): Ambulation;Wheelchair Mobility;Stairs PT Plan PT Intensity: Minimum of 1-2 x/day ,45 to 90  minutes PT Frequency: 5 out of 7 days PT Duration Estimated Length of Stay: 10-12 days PT Treatment/Interventions: Ambulation/gait training;Balance/vestibular  training;Discharge planning;DME/adaptive equipment instruction;Functional mobility training;Neuromuscular re-education;Pain management;Patient/family education;Psychosocial support;Stair training;Therapeutic Exercise;Therapeutic Activities;UE/LE Strength taining/ROM;UE/LE Coordination activities;Wheelchair propulsion/positioning PT Transfers Anticipated Outcome(s): supervision PT Locomotion Anticipated Outcome(s): supervision wheelchair level, short distance household ambulator PT Recommendation Follow Up Recommendations: Home health PT Patient destination: Home Equipment Recommended: To be determined Equipment Details: Patient has power wheelchair and quad cane  Skilled Therapeutic Intervention Skilled therapeutic intervention initiated after completion of evaluation. Discussed with patient, wife, and adult son falls risk, safety within room, and focus of therapy during stay. Discussed possible LOS, goals, and f/u therapy.  PT Evaluation Precautions/Restrictions Precautions Precautions: Fall Precaution Comments: hx Right hemiparesis Restrictions Weight Bearing Restrictions: Yes RLE Weight Bearing: Weight bearing as tolerated General Chart Reviewed: Yes Family/Caregiver Present: Yes (wife and son)   Pain Pain Assessment Pain Assessment: No/denies pain Pain Score: 0-No pain Home Living/Prior Functioning Home Living Available Help at Discharge: Family;Available 24 hours/day Type of Home: House Home Access: Other (comment);Stairs to enter [employer] of Steps: small threshold Home Layout: One level Additional Comments: per wife pt furniture walking at times in bathroom and home  Lives With: Spouse Prior Function Level of Independence: Requires assistive device for independence;Independent with homemaking with  ambulation;Other (comment);Independent with transfers (supervision-mod I)  Able to Take Stairs?: No Driving: No (stopped driving [id number]) Vocation: Retired Leisure: Hobbies-yes (Comment) Comments: sports, read newspaper, watch TV Vision/Perception  Vision - Assessment Eye Alignment: Within Functional Limits Ocular Range of Motion: Within Functional Limits Alignment/Gaze Preference: Within Defined Limits Tracking/Visual Pursuits: Able to track stimulus in all quads without difficulty;Requires cues, head turns, or add eye shifts to track Saccades: Within functional limits Convergence: Within functional limits  Cognition Orientation Level: Oriented X4 (per patient spouse, difficult to assess d/t language barrier) Sensation Sensation Light Touch: Impaired Detail Light Touch Impaired Details: Impaired RUE;Impaired RLE Additional Comments: decreased sensation throughout R side at baseline due to hx of CVA Coordination Gross Motor Movements are Fluid and Coordinated: No Fine Motor Movements are Fluid and Coordinated: No Coordination and Movement Description: hx of R spastic hemiplegia at baseline, increased tone in RLE Finger Nose Finger Test: hx of R hemiplegia at baseline Motor  Motor Motor: Abnormal tone;Abnormal postural alignment and control;Hemiplegia Motor - Skilled Clinical Observations: baseline R spastic hemiplegia, forward flexed posture  Mobility Bed Mobility Bed Mobility: Sit to Supine Sit to Supine: 4: Min assist;HOB flat Sit to Supine - Details: Verbal cues for technique;Verbal cues for sequencing Sit to Supine - Details (indicate cue type and reason): assist to bring RLE onto bed due to increased tone Transfers Transfers: Yes Stand Pivot Transfers: 4: Min assist;With armrests Stand Pivot Transfer Details: Manual facilitation for placement;Manual facilitation for weight bearing;Verbal cues for sequencing Stand Pivot Transfer Details (indicate cue type and reason):  therapist stabilizing RLE on ground with transfer due to increased tone, per family report patient kept RLE up in air at baseline due to tone Locomotion  Ambulation Ambulation: Yes Ambulation/Gait Assistance: 4: Min assist;3: Mod assist Ambulation Distance (Feet): 15 Feet Assistive device: Large base quad cane Gait Gait: Yes Gait Pattern: Impaired Gait Pattern: Step-to pattern;Decreased step length - left;Decreased stance time - right;Decreased stride length;Decreased weight shift to right;Right flexed knee in stance;Left flexed knee in stance;Scissoring;Trunk flexed;Narrow base of support Gait velocity: significantly decreased Stairs / Additional Locomotion Stairs: Yes Stairs Assistance: 3: Mod assist Stairs Assistance Details: Verbal cues for technique;Verbal cues for gait pattern;Verbal cues for safe use of DME/AE;Verbal cues for precautions/safety;Manual facilitation for weight bearing;Manual facilitation for placement Stairs  Assistance Details (indicate cue type and reason): intermittent assist for RLE placement due to adductor tone and to facilitate increased weightbearing Stair Management Technique: One rail Left;Step to pattern;Forwards Number of Stairs: 5 Height of Stairs: 6 Ramp: Not tested (comment) Curb: Not tested (comment) Wheelchair Mobility Wheelchair Mobility: Yes Wheelchair Assistance: 4: Advertising account executive Details: Verbal cues for technique;Verbal cues for sequencing;Visual cues/gestures for sequencing Wheelchair Propulsion: Left upper extremity;Left lower extremity Wheelchair Parts Management: Needs assistance Distance: 75  Trunk/Postural Assessment  Cervical Assessment Cervical Assessment: Exceptions to [facility] (forward head) Thoracic Assessment Thoracic Assessment: Exceptions to [facility] (kyphotic) Lumbar Assessment Lumbar Assessment: Exceptions to [facility] (posterior pelvic tilt) Postural Control Postural Control: Deficits on evaluation Protective  Responses: impaired/delayed  Balance Balance Balance Assessed: Yes Static Standing Balance Static Standing - Balance Support: During functional activity;Left upper extremity supported Static Standing - Level of Assistance: 5: Stand by assistance;4: Min assist Dynamic Standing Balance Dynamic Standing - Balance Support: During functional activity;Left upper extremity supported Dynamic Standing - Level of Assistance: 4: Min assist Extremity Assessment  RUE Assessment RUE Assessment: Exceptions to [facility] RUE AROM (degrees) RUE Overall AROM Comments: hx of hemiplegia PTA (2 previous CVAs); adhesive capsulitis noted PTA RUE Strength RUE Overall Strength Comments: 2+/5 overall due to hx of hemiplegia (2 previous CVAs) LUE Assessment LUE Assessment: Within Functional Limits (4/5 strength) RLE Assessment RLE Assessment: Exceptions to [facility] RLE Strength RLE Overall Strength: Due to premorbid status;Deficits RLE Overall Strength Comments: grossly 3- to 3+/5 throughout, no pain with MMT LLE Assessment LLE Assessment: Within Functional Limits (grossly 4+/5)  FIM:  FIM - Bed/Chair Transfer Bed/Chair Transfer Assistive Devices: Arm rests Bed/Chair Transfer: 4: Chair or W/C > Bed: Min A (steadying Pt. > 75%);4: Bed > Chair or W/C: Min A (steadying Pt. > 75%);4: Sit > Supine: Min A (steadying pt. > 75%/lift 1 leg) FIM - Locomotion: Wheelchair Distance: 75 Locomotion: Wheelchair: 2: Travels 50 - 149 ft with minimal assistance (Pt.>75%) FIM - Locomotion: Ambulation Locomotion: Ambulation Assistive Devices: Nurse, adult Ambulation/Gait Assistance: 4: Min assist;3: Mod assist Locomotion: Ambulation: 1: Travels less than 50 ft with moderate assistance (Pt: 50 - 74%) FIM - Locomotion: Stairs Locomotion: Scientist, physiological: Hand rail - 1 Locomotion: Stairs: 2: Up and Down 4 - 11 stairs with moderate assistance (Pt: 50 - 74%)   Refer to Care Plan for Long Term Goals  Recommendations for other  services: None  Discharge Criteria: Patient will be discharged from PT if patient refuses treatment 3 consecutive times without medical reason, if treatment goals not met, if there is a change in medical status, if patient makes no progress towards goals or if patient is discharged from hospital.  The above assessment, treatment plan, treatment alternatives and goals were discussed and mutually agreed upon: by patient and by family  Laretta Alstrom 02/14/2015, 12:24 PM

## 2015-02-14 NOTE — Progress Notes (Signed)
Occupational Therapy Note  Patient Details  Name: [name] MRN: [id number] Date of Birth: 07/02/1934  Today's Date: 02/14/2015 OT Individual Time: 1330-1430 OT Individual Time Calculation (min): 60 min   Pt denied pain Individual Therapy  Pt resting in recliner upon arrival with wife present.  Pt's wife present throughout session.  Pt initially practiced w/c<>bed transfers X 3 and bed mobility; steady A for transfers and supervision for bed mobility.  Tub bench demonstrated and pt practiced X 3.  Pt stated he wanted to "think about it" before making decision.  Pt currently uses shower chair at home.  Discussed with patient and wife recommendation for tub bench.  Pt returned to recliner with wife present.   Lavone NeriLanier, Tyshika Baldridge [facility] Center[facility]appell 02/14/2015, 2:47 PM

## 2015-02-14 NOTE — Evaluation (Signed)
Occupational Therapy Assessment and Plan  Patient Details  Name: [name] MRN: [id number] Date of Birth: [birth year]  OT Diagnosis: abnormal posture, hemiplegia affecting dominant side, muscle weakness (generalized) and pain in joint Rehab Potential: Rehab Potential (ACUTE ONLY): Excellent ELOS: 10-12 days   Today's Date: 02/14/2015 OT Individual Time: 0700-0800 OT Individual Time Calculation (min): 60 min     Problem List:  Patient Active Problem List   Diagnosis Date Noted  . Femoral neck fracture 02/13/2015  . Acute respiratory failure with hypoxia 02/11/2015  . HCAP (healthcare-associated pneumonia) 02/11/2015  . Sepsis 02/11/2015  . Microcytic anemia 02/10/2015  . Thrombocytopenia 02/10/2015  . Cerebral infarction due to other mechanism   . Inguinal hernia 02/09/2015  . Fracture of femoral neck, right 02/09/2015  . Closed right hip fracture 02/09/2015  . Fractured hip 02/09/2015  . Tuberculosis of lung, infiltrative 04/26/2009  . Essential hypertension 04/26/2009  . Right spastic hemiparesis 04/26/2009  . OVERACTIVE BLADDER 04/26/2009  . Anorexia 04/26/2009    Past Medical History:  Past Medical History  Diagnosis Date  . Stroke   . Hypertension   . Enlarged prostate   . Inguinal hernia    Past Surgical History:  Past Surgical History  Procedure Laterality Date  . Brain surgery    . Hip pinning,cannulated Right 02/10/2015    Procedure: CANNULATED HIP PINNING;  Surgeon: Renette Butters, MD;  Location: [facility];  Service: Orthopedics;  Laterality: Right;    Assessment & Plan Clinical Impression: [name] is a 81 y.o. right handed limited English-speaking male with history of CVA 2 with right hemiparesis and well known to rehabilitation services, hypertension. Patient lives with his wife primarily used a wheelchair prior to admission. He could ambulate short distances with a cane /walker. Presented 02/09/2015 after a recent fall from the toilet landing  on his right hip. X-rays and imaging revealed right hip fracture. Underwent cannulated hip pinning 02/10/2015 per Dr. Percell Miller. Weightbearing as tolerated right lower extremity. Hospital course pain management. Patient continues on aspirin as prior to admission. Acute blood loss anemia 8.8 and monitored.Marland Kitchen Postoperative chest x-ray shows suspected right sided pneumonia and currently maintained on Primaxin since 02/10/2015 with blood cultures negative and changed to Levaquin for 5 2016. Speech therapy swallow evaluation placed on a dysphagia 2 thin liquid diet. Physical and occupational therapy evaluations completed 02/11/2015 with recommendations of physical medicine rehabilitation consult. Patient transferred to CIR on 02/13/2015. Patient transferred to CIR on 02/13/2015 .    Patient currently requires min with basic self-care skills secondary to muscle weakness, decreased cardiorespiratoy endurance and decreased standing balance, decreased postural control and decreased balance strategies.  Prior to hospitalization, patient could complete BADLs with supervision.  Patient will benefit from skilled intervention to increase independence with basic self-care skills prior to discharge home with care partner.  Anticipate patient will require 24 hour supervision and follow-up therapy to be determined.  OT - End of Session Activity Tolerance: Decreased this session Endurance Deficit: Yes Endurance Deficit Description: requires rest breaks OT Assessment Rehab Potential (ACUTE ONLY): Excellent OT Patient demonstrates impairments in the following area(s): Balance;Perception;Safety;Sensory;Endurance;Motor OT Basic ADL's Functional Problem(s): Grooming;Bathing;Dressing;Toileting OT Transfers Functional Problem(s): Tub/Shower;Toilet OT Additional Impairment(s): Fuctional Use of Upper Extremity (hx of R hemiplegia) OT Plan OT Intensity: Minimum of 1-2 x/day, 45 to 90 minutes OT Frequency: 5 out of 7 days OT  Duration/Estimated Length of Stay: 10-12 days OT Treatment/Interventions: Balance/vestibular training;Community reintegration;DME/adaptive equipment instruction;Discharge planning;Functional mobility training;Neuromuscular re-education;Patient/family education;Psychosocial support;Pain management;Self  Care/advanced ADL retraining;Therapeutic Activities;Therapeutic Exercise;Splinting/orthotics;UE/LE Strength taining/ROM;UE/LE Coordination activities OT Self Feeding Anticipated Outcome(s): n/a OT Basic Self-Care Anticipated Outcome(s): supervision OT Toileting Anticipated Outcome(s): supervision OT Bathroom Transfers Anticipated Outcome(s): supervision OT Recommendation Patient destination: Home Follow Up Recommendations: Other (comment) (TBD; per wife they "don't like people in our home.") Equipment Recommended: To be determined   Skilled Therapeutic Intervention OT eval completed. Discussed role of OT, goals of therapy, safety plan, fall risk, follow-up, and DME with wife present. ADL retraining focused on bed mobility, functional transfers, standing balance, and activity tolerance. Pt received sitting in recliner chair. Completed stand pivot transfer recliner>bed with steady assist using RW. Completed supine<>sit at supervision level with HOB flat. Completed stand pivot transfer bed>w/c with min A. Completed bathing at sink with min A for standing balance and pt placing minimal weight through RLE. Pt demonstrating compensatory strategies without cues for use of RUE (hemiplegia from previous CVAs). Completed dressing sit<>stand level with min A for balance and no report of pain. At end of session pt left sitting in w/c with all needs in reach and wife present. Pt required frequent rest breaks throughout session d/t fatigue and cultural beliefs of preserving energy.   OT Evaluation Precautions/Restrictions  Precautions Precautions: Fall Precaution Comments: hx Right  hemiparesis Restrictions Weight Bearing Restrictions: Yes RLE Weight Bearing: Weight bearing as tolerated General   Vital Signs Therapy Vitals Temp: 98.3 F (36.8 C) Temp Source: Oral Pulse Rate: 75 BP: 138/68 mmHg Patient Position (if appropriate): Lying Oxygen Therapy SpO2: 97 % O2 Device: Not Delivered Pain Pain Assessment Faces Pain Scale: Hurts little more Pain Type: Acute pain Pain Location: Hip Pain Orientation: Right Home Living/Prior Functioning Home Living Available Help at Discharge: Family, Available 24 hours/day Type of Home: House Home Access: Other (comment), Stairs to enter Entrance Stairs-Number of Steps: small 1 step Home Layout: One level Additional Comments: per wife pt furniture walking at times in bathroom and home  Lives With: Spouse Prior Function Level of Independence: Requires assistive device for independence, Independent with homemaking with ambulation, Other (comment) (supervision-mod I) Vocation: Retired Leisure: Hobbies-yes (Comment) Comments: sports ADL   Vision/Perception  Vision- History Baseline Vision/History: Wears glasses Wears Glasses: Reading only Patient Visual Report: No change from baseline Vision- Assessment Vision Assessment?: Yes Eye Alignment: Within Functional Limits Ocular Range of Motion: Within Functional Limits Alignment/Gaze Preference: Within Defined Limits Tracking/Visual Pursuits: Able to track stimulus in all quads without difficulty;Requires cues, head turns, or add eye shifts to track Saccades: Within functional limits Convergence: Within functional limits Visual Fields: No apparent deficits  Cognition Overall Cognitive Status: Within Functional Limits for tasks assessed Arousal/Alertness: Awake/alert Orientation Level: Oriented X4 Attention: Selective;Sustained Sustained Attention: Appears intact Selective Attention: Appears intact Memory: Appears intact Awareness: Appears intact Problem Solving:  Appears intact Safety/Judgment: Appears intact Sensation Sensation Light Touch: Impaired Detail Light Touch Impaired Details: Impaired RUE;Impaired RLE Additional Comments: decreased sensation throughout R side at baseline due to hx of CVA Coordination Gross Motor Movements are Fluid and Coordinated: No Fine Motor Movements are Fluid and Coordinated: No Coordination and Movement Description: hx of R spastic hemiplegia at baseline, increased tone in RLE Finger Nose Finger Test: hx of R hemiplegia at baseline Motor  Motor Motor: Abnormal tone;Abnormal postural alignment and control;Hemiplegia Motor - Skilled Clinical Observations: baseline R spastic hemiplegia, forward flexed posture Mobility  Bed Mobility Bed Mobility: Sit to Supine;Supine to Sit Supine to Sit: 5: Supervision Supine to Sit Details: Verbal cues for technique;Verbal cues for sequencing Sit  to Supine: 5: Supervision Sit to Supine - Details: Verbal cues for technique;Verbal cues for sequencing Sit to Supine - Details (indicate cue type and reason): assist to bring RLE onto bed due to increased tone Transfers Transfers: Stand to Sit;Sit to Stand Sit to Stand: 4: Min assist Sit to Stand Details: Tactile cues for posture;Verbal cues for sequencing;Verbal cues for technique;Manual facilitation for weight bearing Stand to Sit: 4: Min assist Stand to Sit Details (indicate cue type and reason): Tactile cues for posture;Verbal cues for sequencing;Verbal cues for technique;Manual facilitation for weight bearing  Trunk/Postural Assessment  Cervical Assessment Cervical Assessment: Exceptions to [facility] (forward head) Thoracic Assessment Thoracic Assessment: Exceptions to [facility] (kyphotic) Lumbar Assessment Lumbar Assessment: Exceptions to [facility] (posterior pelvic tilt) Postural Control Postural Control: Deficits on evaluation Protective Responses: impaired/delayed  Balance Balance Balance Assessed: Yes Static Standing  Balance Static Standing - Balance Support: During functional activity;Left upper extremity supported Static Standing - Level of Assistance: 5: Stand by assistance;4: Min assist Dynamic Standing Balance Dynamic Standing - Balance Support: During functional activity;Left upper extremity supported Dynamic Standing - Level of Assistance: 4: Min assist Extremity/Trunk Assessment RUE Assessment RUE Assessment: Exceptions to [facility] RUE AROM (degrees) RUE Overall AROM Comments: hx of hemiplegia PTA (2 previous CVAs); adhesive capsulitis noted PTA RUE Strength RUE Overall Strength Comments: 2+/5 overall due to hx of hemiplegia (2 previous CVAs) LUE Assessment LUE Assessment: Within Functional Limits (4/5 strength)  FIM:  FIM - Grooming Grooming Steps: Wash, rinse, dry face;Wash, rinse, dry hands Grooming: 5: Set-up assist to obtain items FIM - Bathing Bathing Steps Patient Completed: Chest;Right Arm;Right lower leg (including foot);Left upper leg;Left lower leg (including foot);Left Arm;Abdomen;Front perineal area;Buttocks Bathing: 4: Min-Patient completes 8-9 22f10 parts or 75+ percent FIM - Upper Body Dressing/Undressing Upper body dressing/undressing steps patient completed: Thread/unthread left sleeve of front closure shirt/dress;Button/unbutton shirt;Pull shirt around back of front closure shirt/dress Upper body dressing/undressing: 4: Min-Patient completed 75 plus % of tasks FIM - Lower Body Dressing/Undressing Lower body dressing/undressing steps patient completed: Thread/unthread left pants leg;Thread/unthread right pants leg;Don/Doff left sock;Don/Doff right sock Lower body dressing/undressing: 4: Min-Patient completed 75 plus % of tasks FIM - BControl and instrumentation engineerDevices: Walker;Arm rests Bed/Chair Transfer: 5: Supine > Sit: Supervision (verbal cues/safety issues);5: Sit > Supine: Supervision (verbal cues/safety issues);4: Bed > Chair or W/C: Min A (steadying  Pt. > 75%);4: Chair or W/C > Bed: Min A (steadying Pt. > 75%) FIM - TRadio producerDevices: Grab bars Toilet Transfers: 4-To toilet/BSC: Min A (steadying Pt. > 75%);4-From toilet/BSC: Min A (steadying Pt. > 75%)   Refer to Care Plan for Long Term Goals  Recommendations for other services: None  Discharge Criteria: Patient will be discharged from OT if patient refuses treatment 3 consecutive times without medical reason, if treatment goals not met, if there is a change in medical status, if patient makes no progress towards goals or if patient is discharged from hospital.  The above assessment, treatment plan, treatment alternatives and goals were discussed and mutually agreed upon: by patient and by family  PDuayne Cal4/04/2015, 7:59 AM

## 2015-02-14 NOTE — Progress Notes (Signed)
*  PRELIMINARY RESULTS* Vascular Ultrasound Lower extremity venous duplex has been completed.  Preliminary findings: No evidence of DVT.   Farrel DemarkJill Eunice, RDMS, RVT  02/14/2015, 9:54 AM

## 2015-02-15 ENCOUNTER — Inpatient Hospital Stay (HOSPITAL_COMMUNITY): Payer: Medicare Other

## 2015-02-15 ENCOUNTER — Inpatient Hospital Stay (HOSPITAL_COMMUNITY): Payer: Medicare Other | Admitting: Physical Therapy

## 2015-02-15 DIAGNOSIS — J189 Pneumonia, unspecified organism: Secondary | ICD-10-CM

## 2015-02-15 DIAGNOSIS — N318 Other neuromuscular dysfunction of bladder: Secondary | ICD-10-CM

## 2015-02-15 LAB — URINALYSIS, ROUTINE W REFLEX MICROSCOPIC
BILIRUBIN URINE: NEGATIVE
Glucose, UA: NEGATIVE mg/dL
Hgb urine dipstick: NEGATIVE
Ketones, ur: 15 mg/dL — AB
Leukocytes, UA: NEGATIVE
Nitrite: NEGATIVE
PROTEIN: NEGATIVE mg/dL
Specific Gravity, Urine: 1.013 (ref 1.005–1.030)
UROBILINOGEN UA: 1 mg/dL (ref 0.0–1.0)
pH: 7.5 (ref 5.0–8.0)

## 2015-02-15 NOTE — Progress Notes (Addendum)
Occupational Therapy Note  Patient Details  Name: [name] MRN: [id number] Date of Birth: 01/03/1934  Today's Date: 02/15/2015 OT Missed Time:  (60) Missed Time Reason: Patient unwilling/refused to participate without medical reason per son's request  Pt missed 60 mins skilled OT services.  Pt's son stated he did not want pt to receive any therapy today because of patient's continued lethargy since 0400 this morning.  Pt scheduled for 90 mins OT in the afternoon.  I will attempt to see patient this afternoon.   Lavone NeriLanier, Malic Rosten [facility] CenterChappell 02/15/2015, 7:49 AM

## 2015-02-15 NOTE — Progress Notes (Signed)
[place] PHYSICAL MEDICINE & REHABILITATION     PROGRESS NOTE    Subjective/Complaints: Had chills last night with confusion/fatigue. Had a good day "prior to 4:30PM" according to son. No cough, changes in bladder habits.  Objective: Vital Signs: Blood pressure 140/70, pulse 84, temperature 99 F (37.2 C), temperature source Oral, resp. rate 24, height  (1.626 m), SpO2 94 %. No results found.  Recent Labs  02/13/15 1822  WBC 7.7  HGB 9.8*  HCT 31.2*  PLT 107*    Recent Labs  02/13/15 0723 02/13/15 1822  NA 139 137  K 3.7 3.7  CL 107 105  GLUCOSE 95 107*  BUN 9 7  CREATININE 0.62 0.61  CALCIUM 7.9* 7.9*   CBG (last 3)  No results for input(s): GLUCAP in the last 72 hours.  Wt Readings from Last 3 Encounters:  02/13/15 61.2 kg (134 lb 14.7 oz)  12/29/14 54.432 kg (120 lb)  04/26/09 48.716 kg (107 lb 6.4 oz)    Physical Exam:  Gen: appears fatigued. Slower to respond today HENT: oral mucosa pink and moist Head: Normocephalic.  Eyes: EOM are normal.  Neck: Normal range of motion. Neck supple. No thyromegaly present.  Cardiovascular: Normal rate and regular rhythm.  Respiratory: Effort normal and breath sounds generally normal---occasional coarse breath sounds. No respiratory distress.  GI: Soft. Bowel sounds are normal. He exhibits no distension.  Musculoskeletal:  Right hip dressed, thigh tender to palpation and ROM. Adhesive capsulitis right shoulder--30 degrees abd Neurological: He is alert.  Patient can provide his name and age. Appropriate for yes and no. Follows simple commands. RUE 2 to 2+/5. RLE 1-2/5. LUE 4+/5. Alert and generally appropriate.  Tone:  MAS 2-3 throughout the RUE. RLE MAS 1-2 Skin:  Right hip wound remains intact clean and dry. No erythema/warmth near incision Psychiatric: He has a normal mood and affect. His behavior is normal. Cooperative. Fair insight and awareness. Language barrier  Assessment/Plan: 1. Functional  deficits secondary to right femoral neck fracture which require 3+ hours per day of interdisciplinary therapy in a comprehensive inpatient rehab setting. Physiatrist is providing close team supervision and 24 hour management of active medical problems listed below. Physiatrist and rehab team continue to assess barriers to discharge/monitor patient progress toward functional and medical goals. FIM: FIM - Bathing Bathing Steps Patient Completed: Chest, Right Arm, Right lower leg (including foot), Left upper leg, Left lower leg (including foot), Left Arm, Abdomen, Front perineal area, Buttocks Bathing: 4: Min-Patient completes 8-9 59f 10 parts or 75+ percent  FIM - Upper Body Dressing/Undressing Upper body dressing/undressing steps patient completed: Thread/unthread left sleeve of front closure shirt/dress, Button/unbutton shirt, Pull shirt around back of front closure shirt/dress Upper body dressing/undressing: 4: Min-Patient completed 75 plus % of tasks FIM - Lower Body Dressing/Undressing Lower body dressing/undressing steps patient completed: Thread/unthread left pants leg, Thread/unthread right pants leg, Don/Doff left sock, Don/Doff right sock Lower body dressing/undressing: 4: Min-Patient completed 75 plus % of tasks     FIM - Diplomatic Services operational officer Devices: Grab bars Toilet Transfers: 4-To toilet/BSC: Min A (steadying Pt. > 75%), 4-From toilet/BSC: Min A (steadying Pt. > 75%)  FIM - Bed/Chair Transfer Bed/Chair Transfer Assistive Devices: Arm rests Bed/Chair Transfer: 4: Chair or W/C > Bed: Min A (steadying Pt. > 75%), 4: Bed > Chair or W/C: Min A (steadying Pt. > 75%), 4: Sit > Supine: Min A (steadying pt. > 75%/lift 1 leg)  FIM - Locomotion: Wheelchair Distance: 75  Locomotion: Wheelchair: 2: Travels 50 - 149 ft with minimal assistance (Pt.>75%) FIM - Locomotion: Ambulation Locomotion: Ambulation Assistive Devices: Occupational hygienistCane - Quad Ambulation/Gait Assistance: 4: Min  assist, 3: Mod assist Locomotion: Ambulation: 1: Travels less than 50 ft with moderate assistance (Pt: 50 - 74%)  Comprehension Comprehension Mode: Auditory Comprehension: 5-Understands basic 90% of the time/requires cueing < 10% of the time  Expression Expression Mode: Verbal Expression: 5-Expresses basic 90% of the time/requires cueing < 10% of the time.  Social Interaction Social Interaction: 6-Interacts appropriately with others with medication or extra time (anti-anxiety, antidepressant).  Problem Solving Problem Solving: 5-Solves basic 90% of the time/requires cueing < 10% of the time  Memory Memory: 5-Recognizes or recalls 90% of the time/requires cueing < 10% of the time  Medical Problem List and Plan: 1. Functional deficits secondary to right femoral neck fracture. Status post cannulated hip pinning 02/10/2015. Weightbearing as tolerated  2. DVT Prophylaxis/Anticoagulation: SCDs. Check vascular study today 3. Pain Management: Oxycodone and Robaxin as needed. Monitor with decreased mobility 4. Acute blood loss anemia. Follow-up bloodwork 5. Neuropsych: This patient is capable of making decisions on his own behalf. 6. Skin/Wound Care: Routine skin checks 7. Fluids/Electrolytes/Nutrition: encourage po, regular labs 8. History of CVA 2 with right hemiparesis. Continue aspirin 9. History of Hypertension. Patient on Toprol 50 mg daily prior to admission. Resume as tolerated 10. BPH. Avodart/Flomax. Check PVRs 3, voiding fairly frequently 11.HCAP. Levaquin 3 days, afebrile currently 12. Spastic right hemiparesis, adhesive capsulitis right shoulder  - aggressive ROM. Will try low dose baclofen to see how he responds  - botox is a consideration---probably more an outpt option however 13. AMS/ low grade temp:  -Stopped baclofen  -check ua and cx, cxr  -IS  -supportive care for now. Reassured son/daughter  -hold AM therapies. Try again this afternoon  LOS (Days) 2 A FACE  TO FACE EVALUATION WAS PERFORMED  SWARTZ,ZACHARY T 02/15/2015 9:31 AM

## 2015-02-15 NOTE — Progress Notes (Signed)
Called per Rehab RN Bernita for Pt with change in LOC. Per RN pt arouses but is more lethargic than he was at her initial assessment. On 4/6 Pt received Baclofen x 2 doses including  5mg  PO at 1015 and 5mg  PO at 2124. D. Anguilli PA paged prior to my arrival and updated on Pt status per [facility]. Baclofen D/c'd and urine culture ordered. Per P.A to see this morning. Pt assessed by myself, appears comfortable in chair. Lethargic but easily aroused. Able to state name, hospital, and date. Follows simple commands and denies pain. Po2 94 % on RA. Mild fever at 99.1 axillary. Face symmetrical and moves all extremities equally, weak.   Pt refuses to get in bed and says he is more comfortable in chair at this time. Wife at bedside very upset. Insisting Patients's bedside "Nurse is not taking care of him." Attempted to reassure wife that P.A had been called and updated, and discussed new orders. Wife very upset and refuses emotional support. Explained many times that bedside RN, NT and myself were very concerned about Pt change and that his RN will monitor Pt closely with RRT available 24/7. Pt son called to bedside per Wife and he was updated as well on Pt status and interventions. Pt left resting in chair, family at his side. RN advised to monitor closely and notify myself for worsening changes.

## 2015-02-15 NOTE — Progress Notes (Addendum)
Nursing Note: Rounded on pt and pt sitting in recliner.Pt wanted to void and has been standing to void with his wife but is weak and unable to stand.Pt awake,arouses easily but shortly after goes back to sleep.Pt unable to stand,this is not normal for pt and wife very upset and anxious.Pt assessed.Checked vitals T-99.0 P-84 R-24 Bp-140/70 PO2 94% on r/a.Pt able to follow simple commands,pt MAE but obviously much weaker.Pt alert and oriented [facility],place and name. Pt able to lift his arms and legs but weak,pt able to squeeze my hand on request.Pt aaksed to void and voided a small amount.Wife yelling and states that pt does not know what he is saying and that the new medicine made him sick.Wife wanted to know the name of new medicine.A; On-call Dan Anguilli,pa called back and made ware of all events.Baclofen was new med and it was started yesterday.It was discontinued and a urine was ordered.I talked on the phone with the pt's son.Pt;s wife called her son and I gave the son the same updates that I gave the wife.Attempts to provide support to wife ,but wife too upset right now.Will monitor closely.Bladder scanned for 200 cc.Jesusita OkaDan will be in early this am to see the pt.Wife and son made aware.wbb

## 2015-02-15 NOTE — Progress Notes (Signed)
Physical Therapy Session Note  Patient Details  Name: [name] MRN: [id number] Date of Birth: 03/27/1934  Today's Date: 02/15/2015 PT Individual Time: 0905-1000 PT Individual Time Calculation (min): 55 min   Short Term Goals: Week 1:  PT Short Term Goal 1 (Week 1):  LTGs of overall supervision  Skilled Therapeutic Interventions/Progress Updates:   Session focused on functional transfers, wheelchair propulsion, activity tolerance, and pt/family education. Patient received asleep in recliner, wife present. Patient aroused with increased time with therapist sitting patient upright in recliner, agreeable to using urinal but unable to void. Patient initially repeating, "Go away, leave me alone," but not providing reason for refusal and eventually agreeable to getting out of recliner. With greatly increased time, patient performed stand pivot transfer while maintaining flexed posture with therapist facilitating RLE weightbearing recliner <> wheelchair with max A. Patient propelled wheelchair with verbal/visual cues for L hemi technique x 75 ft with min A for steering/propulsion, increased time. Patient reported need to urinate, able to void in urinal. Patient transferred to NuStep with max A, performed NuStep using LUE and BLE with therapist facilitating neutral alignment for RLE at level 4 x 5 min. Patient returned to room and left sitting in recliner with BLE elevated, wife in room.  Therapy Documentation Precautions:  Precautions Precautions: Fall Precaution Comments: hx Right hemiparesis Restrictions Weight Bearing Restrictions: Yes RLE Weight Bearing: Weight bearing as tolerated Pain: Pain Assessment Pain Assessment: No/denies pain Locomotion : Wheelchair Mobility Distance: 75   See FIM for current functional status  Therapy/Group: Individual Therapy  [name], [name] 02/15/2015, 10:31 AM

## 2015-02-15 NOTE — Progress Notes (Signed)
Nursing Note: In to see pt and NT attempted to take pt's vital signs but wife refused.Pt contiunes to sit in recliner and denies pain,more awake and alert.Pt had pulse oximeter on to monitor oxygen level, Family and pt did not like the noise.on to monitor and took it off.Pt resting quietly and denies pain,no distress noted.wbb

## 2015-02-15 NOTE — Progress Notes (Signed)
Occupational Therapy Note  Patient Details  Name: Earl LagosJimmy L Korber MRN: [id number] Date of Birth: 10/18/1934  Today's Date: 02/15/2015 OT Individual Time: 1300-1345 OT Individual Time Calculation (min): 45 min   Pt denied pain Individual Therapy  Pt resting in recliner with son present.  Pt stated he was extremely tired but attempted to perform sit<>stand from recliner X 4.  Pt required tot A to perform task.  Discussed goals with son/patient and equipment recommendations.  Discussed possibility of taking shower the next day if MD approves.  Pt and son in agreement.   Lavone NeriLanier, Jahking Lesser Sanford MayvilleChappell 02/15/2015, 1:52 PM

## 2015-02-16 ENCOUNTER — Inpatient Hospital Stay (HOSPITAL_COMMUNITY): Payer: Medicare Other | Admitting: Occupational Therapy

## 2015-02-16 ENCOUNTER — Inpatient Hospital Stay (HOSPITAL_COMMUNITY): Payer: Medicare Other | Admitting: *Deleted

## 2015-02-16 ENCOUNTER — Inpatient Hospital Stay (HOSPITAL_COMMUNITY): Payer: Medicare Other | Admitting: Physical Therapy

## 2015-02-16 ENCOUNTER — Inpatient Hospital Stay (HOSPITAL_COMMUNITY): Payer: Medicare Other | Admitting: Rehabilitation

## 2015-02-16 LAB — URINE CULTURE
CULTURE: NO GROWTH
Colony Count: NO GROWTH

## 2015-02-16 MED ORDER — MUSCLE RUB 10-15 % EX CREA
TOPICAL_CREAM | CUTANEOUS | Status: DC | PRN
  Administered 2015-02-16: 16:00:00 via TOPICAL
  Filled 2015-02-16: qty 85

## 2015-02-16 NOTE — Progress Notes (Signed)
[name] PHYSICAL MEDICINE & REHABILITATION     PROGRESS NOTE    Subjective/Complaints: Feeling better. Sitting in chair smiling. Ready for therapies today.   Objective: Vital Signs: Blood pressure 132/65, pulse 82, temperature 98.8 F (37.1 C), temperature source Oral, resp. rate 17, height 5\' 4"  (1.626 m), SpO2 94 %. Dg Chest 2 View  02/15/2015   CLINICAL DATA:  Fever  EXAM: CHEST  2 VIEW  COMPARISON:  February 10, 2015  FINDINGS: There is a small left pleural effusion. There is no edema or consolidation. Heart is upper normal in size with pulmonary vascularity within normal limits. There is atherosclerotic change in aorta. The aorta is mildly prominent. No adenopathy. No bone lesions.  IMPRESSION: Small left pleural effusion. No edema or consolidation. Prominence of the aorta suggests chronic hypertensive change.   Electronically Signed   By: Bretta BangWilliam  Woodruff III M.D.   On: 02/15/2015 10:56    Recent Labs  02/13/15 1822  WBC 7.7  HGB 9.8*  HCT 31.2*  PLT 107*    Recent Labs  02/13/15 1822  NA 137  K 3.7  CL 105  GLUCOSE 107*  BUN 7  CREATININE 0.61  CALCIUM 7.9*   CBG (last 3)  No results for input(s): GLUCAP in the last 72 hours.  Wt Readings from Last 3 Encounters:  02/13/15 61.2 kg (134 lb 14.7 oz)  12/29/14 54.432 kg (120 lb)  04/26/09 48.716 kg (107 lb 6.4 oz)    Physical Exam:  Gen: appears fatigued. Slower to respond today HENT: oral mucosa pink and moist Head: Normocephalic.  Eyes: EOM are normal.  Neck: Normal range of motion. Neck supple. No thyromegaly present.  Cardiovascular: Normal rate and regular rhythm.  Respiratory: Effort normal and breath sounds generally normal---occasional coarse breath sounds. No respiratory distress.  GI: Soft. Bowel sounds are normal. He exhibits no distension.  Musculoskeletal:  Right hip dressed, thigh tender to palpation and ROM. Adhesive capsulitis right shoulder--30 degrees abd Neurological: He is alert.   Patient can provide his name and age. Appropriate for yes and no. Follows simple commands. RUE 2 to 2+/5. RLE 1-2/5. LUE 4+/5. Alert and generally appropriate.  Tone:  MAS 2-3 throughout the RUE. RLE MAS 1-2 Skin:  Right hip wound intact clean and dry. No erythema/warmth near incision Psychiatric: He has a normal mood and affect. His behavior is normal. Cooperative. Fair insight and awareness. Language barrier  Assessment/Plan: 1. Functional deficits secondary to right femoral neck fracture which require 3+ hours per day of interdisciplinary therapy in a comprehensive inpatient rehab setting. Physiatrist is providing close team supervision and 24 hour management of active medical problems listed below. Physiatrist and rehab team continue to assess barriers to discharge/monitor patient progress toward functional and medical goals. FIM: FIM - Bathing Bathing Steps Patient Completed: Chest, Right Arm, Right lower leg (including foot), Left upper leg, Left lower leg (including foot), Left Arm, Abdomen, Front perineal area, Buttocks Bathing: 4: Min-Patient completes 8-9 1077f 10 parts or 75+ percent  FIM - Upper Body Dressing/Undressing Upper body dressing/undressing steps patient completed: Thread/unthread left sleeve of front closure shirt/dress, Button/unbutton shirt, Pull shirt around back of front closure shirt/dress Upper body dressing/undressing: 4: Min-Patient completed 75 plus % of tasks FIM - Lower Body Dressing/Undressing Lower body dressing/undressing steps patient completed: Thread/unthread left pants leg, Thread/unthread right pants leg, Don/Doff left sock, Don/Doff right sock Lower body dressing/undressing: 4: Min-Patient completed 75 plus % of tasks     FIM - Toilet Transfers  Museum/gallery curator Devices: Therapist, music Transfers: 4-To toilet/BSC: Min A (steadying Pt. > 75%), 4-From toilet/BSC: Min A (steadying Pt. > 75%)  FIM - Bed/Chair Transfer Bed/Chair Transfer  Assistive Devices: Arm rests Bed/Chair Transfer: 2: Bed > Chair or W/C: Max A (lift and lower assist), 2: Chair or W/C > Bed: Max A (lift and lower assist)  FIM - Locomotion: Wheelchair Distance: 75 Locomotion: Wheelchair: 2: Travels 50 - 149 ft with minimal assistance (Pt.>75%) FIM - Locomotion: Ambulation Locomotion: Ambulation Assistive Devices: Occupational hygienist Ambulation/Gait Assistance: 4: Min assist, 3: Mod assist Locomotion: Ambulation: 0: Activity did not occur  Comprehension Comprehension Mode: Auditory Comprehension: 4-Understands basic 75 - 89% of the time/requires cueing 10 - 24% of the time  Expression Expression Mode: Verbal Expression: 4-Expresses basic 75 - 89% of the time/requires cueing 10 - 24% of the time. Needs helper to occlude trach/needs to repeat words.  Social Interaction Social Interaction: 6-Interacts appropriately with others with medication or extra time (anti-anxiety, antidepressant).  Problem Solving Problem Solving: 5-Solves basic 90% of the time/requires cueing < 10% of the time  Memory Memory: 5-Recognizes or recalls 90% of the time/requires cueing < 10% of the time  Medical Problem List and Plan: 1. Functional deficits secondary to right femoral neck fracture. Status post cannulated hip pinning 02/10/2015. Weightbearing as tolerated  2. DVT Prophylaxis/Anticoagulation: SCDs. Check vascular study today 3. Pain Management: Oxycodone and Robaxin as needed. Monitor with decreased mobility 4. Acute blood loss anemia. Follow-up bloodwork 5. Neuropsych: This patient is capable of making decisions on his own behalf. 6. Skin/Wound Care: Routine skin checks 7. Fluids/Electrolytes/Nutrition: encourage po, regular labs 8. History of CVA 2 with right hemiparesis. Continue aspirin 9. History of Hypertension. Patient on Toprol 50 mg daily prior to admission. Resume as tolerated 10. BPH. Avodart/Flomax. Check PVRs 3, voiding fairly frequently---especially  HS---encouraged wife to contact staff for toileting 11.HCAP. treated 12. Spastic right hemiparesis, adhesive capsulitis right shoulder  - aggressive ROM. Will try low dose baclofen to see how he responds  - botox is a consideration---probably more an outpt option however 13. AMS/ low grade temp: now back to baseline  -AMS likely due to baclofen  -cxr without acute findings. ua neg, ucx pending  -IS  -education and support for family  -full therapies  LOS (Days) 3 A FACE TO FACE EVALUATION WAS PERFORMED  Miral Hoopes T 02/16/2015 8:10 AM

## 2015-02-16 NOTE — IPOC Note (Signed)
Overall Plan of Care [facility]) Patient Details Name: [name] MRN: [id number] DOB: 02-07-1934  Admitting Diagnosis: [name] NECK FX  Hospital Problems: Principal Problem:   Fracture of femoral neck, right Active Problems:   Essential hypertension   Right spastic hemiparesis   OVERACTIVE BLADDER   HCAP (healthcare-associated pneumonia)     Functional Problem List: Nursing Bladder, Safety, Sensory, Skin Integrity, Endurance, Medication Management, Motor, Nutrition, Pain  PT Balance, Endurance, Motor, Pain, Safety, Sensory  OT Balance, Perception, Safety, Sensory, Endurance, Motor  SLP    TR         Basic ADL's: OT Grooming, Bathing, Dressing, Toileting     Advanced  ADL's: OT       Transfers: PT Bed Mobility, Bed to Chair, Car, Lobbyist, Technical brewer: PT Ambulation, Psychologist, prison and probation services, Stairs     Additional Impairments: OT Fuctional Use of Upper Extremity (hx of R hemiplegia)  SLP        TR      Anticipated Outcomes Item Anticipated Outcome  Self Feeding n/a  Swallowing      Basic self-care  supervision  Toileting  supervision   Bathroom Transfers supervision  Bowel/Bladder  Cont of B+B  Transfers  supervision  Locomotion  supervision wheelchair level, short distance household ambulator  Communication     Cognition     Pain  Pain at or below level5  Safety/Judgment  maintain safety with cues/supervison   Therapy Plan: PT Intensity: Minimum of 1-2 x/day ,45 to 90 minutes PT Frequency: 5 out of 7 days PT Duration Estimated Length of Stay: 10-12 days OT Intensity: Minimum of 1-2 x/day, 45 to 90 minutes OT Frequency: 5 out of 7 days OT Duration/Estimated Length of Stay: 10-12 days         Team Interventions: Nursing Interventions Patient/Family Education, Medication Management, Pain Management, Discharge Planning, Bowel Management, Skin Care/Wound Management  PT interventions Ambulation/gait training,  Balance/vestibular training, Discharge planning, DME/adaptive equipment instruction, Functional mobility training, Neuromuscular re-education, Pain management, Patient/family education, Psychosocial support, Stair training, Therapeutic Exercise, Therapeutic Activities, UE/LE Strength taining/ROM, UE/LE Coordination activities, Wheelchair propulsion/positioning  OT Interventions Warden/ranger, Firefighter, Fish farm manager, Discharge planning, Functional mobility training, Neuromuscular re-education, Patient/family education, Psychosocial support, Pain management, Self Care/advanced ADL retraining, Therapeutic Activities, Therapeutic Exercise, Splinting/orthotics, UE/LE Strength taining/ROM, UE/LE Coordination activities  SLP Interventions    TR Interventions    SW/CM Interventions      Team Discharge Planning: Destination: PT-Home ,OT- Home , SLP-  Projected Follow-up: PT-Home health PT, OT-  Other (comment) (TBD; per wife they "don't like people in our home."), SLP-  Projected Equipment Needs: PT-To be determined, OT- To be determined, SLP-  Equipment Details: PT-Patient has power wheelchair and quad cane, OT-  Patient/family involved in discharge planning: PT- Patient, Family Adult nurse,  OT-Patient, Family member/caregiver, SLP-   MD ELOS: 11-15d Medical Rehab Prognosis:  Good Assessment: 81 y.o. right handed limited English-speaking male with history of CVA 2 with right hemiparesis and well known to rehabilitation services, hypertension. Patient lives with his wife primarily used a wheelchair prior to admission. He could ambulate short distances with a cane /walker. Presented 02/09/2015 after a recent fall from the toilet landing on his right hip. X-rays and imaging revealed right hip fracture. Underwent cannulated hip pinning 02/10/2015 per Dr. Eulah Pont. Weightbearing as tolerated right lower extremity.    Now requiring 24/7 Rehab RN,MD, as well  as CIR level PT, OT and SLP.  Treatment team will focus on ADLs and mobility with goals set at Sup  See Team Conference Notes for weekly updates to the plan of care

## 2015-02-16 NOTE — Progress Notes (Signed)
His femoral neck fracture was an acute traumatic fracture caused by his fall in all likelihood.   Samual Beals D

## 2015-02-16 NOTE — Progress Notes (Signed)
Occupational Therapy Session Note  Patient Details  Name: [name] MRN: [id number] Date of Birth: 11/24/1933  Today's Date: 02/16/2015 OT Individual Time: [id number] OT Individual Time Calculation (min): 60 min    Short Term Goals: Week 1:  OT Short Term Goal 1 (Week 1): STGs=LTGs   Skilled Therapeutic Interventions/Progress Updates:    Pt seen for ADL bathing and dressing session. Pt in recliner upon arrival, agreeable to tx. Pt transferred recliner> w/c with mod-max assist and cues for technique/ sequencing. Pt taken to ADL apartment for shower using tub shower combo. Pt transferred w/c> tub transfer bench with mod A following cues and demonstration for technique. Pt able to lift B LEs into tub. Pt able to bathe with supervision and cues/encouragement for LB bathing. Pt required mod A for manage of B LEs to lift LEs out of tub following showering task. Pt transferred tub transfer bench> w/c with mod A. Pt dressed sitting in w/c with set up for UB dressing demonstrating functional fine motor skills buttoning small buttons without difficulty. Pt able to thread B LEs into pants and required steadying assist and assist to pull pants up. Pt able to don B socks with encouragement.    Pt returned to room and transferred w/c> recliner with total A. Pt set-up with meal tray, requiring assist to open containers, increased time, and supervision. Pt then instructed in UE ROM exercises. Pt with full ROM in L UE. He was unable to reach full ROM in R UE with PROM. Pt with noted high tone when completing elbow/ flexion ROM exercises.   Pt and family educated regarding role of OT, POC, DME, and d/c planning.   Therapy Documentation Precautions:  Precautions Precautions: Fall Precaution Comments: hx Right hemiparesis Restrictions Weight Bearing Restrictions: Yes RLE Weight Bearing: Weight bearing as tolerated Pain: Pain Assessment Pain Assessment: No/denies pain  See FIM for current functional  status  Therapy/Group: Individual Therapy  [name], Stevee Valenta C 02/16/2015, 9:40 AM

## 2015-02-16 NOTE — Progress Notes (Signed)
Physical Therapy Session Note  Patient Details  Name: [name] MRN: [id number] Date of Birth: 07/01/1934  Today's Date: 02/16/2015 PT Individual Time: 0900-1000 PT Individual Time Calculation (min): 60 min   Short Term Goals: Week 1:  PT Short Term Goal 1 (Week 1):  LTGs of overall supervision  Skilled Therapeutic Interventions/Progress Updates:   Session focused on NMR, sit <> stand transfers, standing balance, upright posture, RLE weightbearing, ambulation, and activity tolerance. Patient sitting in recliner, wife present for session. Patient performed stand pivot transfer using [facility] to wheelchair with mod A. Patient propelled wheelchair using L hemi technique x 150 ft with supervision, min verbal cues for technique. Gait training using WBQC x 30 ft with min-mod A, total verbal/tactile cues for sequencing 3 point gait pattern, facilitating upright posture, and L knee extension during stance phase. Patient performed sit <> stand from arm chair x 5 with min-mod A progressed to supervision, manual facilitation faded to max verbal cues for RLE weightbearing, LLE extension in standing, upright head/trunk, and total > min verbal cues for safe hand placement during transfer. Patient achieved tall kneeling with +2 assist for safety, small bench placed in front with therapist facilitating RUE weightbearing and trunk/hip extension to neutral with max A while patient performed reaching task with LUE to facilitate upright posture and equal BLE weightbearing. Patient unable to maintain upright posture with LUE unsupported despite max A. Patient fatigued quickly throughout session, requiring seated rest breaks as needed. Patient left sitting in wheelchair with wife in room.   Therapy Documentation Precautions:  Precautions Precautions: Fall Precaution Comments: hx Right hemiparesis Restrictions Weight Bearing Restrictions: Yes RLE Weight Bearing: Weight bearing as tolerated Pain: Pain  Assessment Pain Assessment: No/denies pain  See FIM for current functional status  Therapy/Group: Individual Therapy  [name], Zelda Reames A 02/16/2015, 10:48 AM

## 2015-02-16 NOTE — Progress Notes (Signed)
Physical Therapy Make up Session Note  Patient Details  Name: [name] MRN: [id number] Date of Birth: 05/27/1934  Today's Date: 02/16/2015 PT Individual Time: 1030 (make up session)-1100 PT Individual Time Calculation (min): 30 min   Short Term Goals: Week 1:  PT Short Term Goal 1 (Week 1):  LTGs of overall supervision  Skilled Therapeutic Interventions/Progress Updates:   Pt received sitting in w/c in room, wife present as well as social worker discussing DC plans and wife talking about son and being married to pt for 52 years.  Assisted pt to therapy gym at total A level for time management.  Skilled session focused on stair negotiation for activity tolerance and NMR through RLE and WB for strengthening as well as w/c mobility for quality and overall strengthening and endurance.  Performed 4, 6" steps x 2 reps with use of L handrail in step to fashion.  Assist to stabilize RLE during sit<>stand as well as when advancing LLE to step.  Min A to position and place RLE up to step, however good activation of hip and knee flexion to do so mostly on his own.  Ended session with w/c mobility using L hemi technique at S level.  Cues for keeping RLE on foot pedal as well as increased utilization of LLE during task.  Pt stating needing to use restroom, therefore assisted remainder of distance to restroom and performed stand pivot transfer to restroom at min A level with use of grab bar.  Wife to provide S until nurse could get to room.  Nurse tech made aware.    Therapy Documentation Precautions:  Precautions Precautions: Fall Precaution Comments: hx Right hemiparesis Restrictions Weight Bearing Restrictions: Yes RLE Weight Bearing: Weight bearing as tolerated   Pain: Pain Assessment Pain Assessment: No/denies pain   Locomotion : Ambulation Ambulation/Gait Assistance: 4: Min assist;3: Mod assist Wheelchair Mobility Distance: 80   See FIM for current functional status  Therapy/Group:  Individual Therapy  Vista Deckarcell, Hanny Elsberry Ann 02/16/2015, 12:25 PM

## 2015-02-17 ENCOUNTER — Inpatient Hospital Stay (HOSPITAL_COMMUNITY): Payer: Medicare Other | Admitting: Occupational Therapy

## 2015-02-17 ENCOUNTER — Inpatient Hospital Stay (HOSPITAL_COMMUNITY): Payer: Medicare Other

## 2015-02-17 LAB — CULTURE, BLOOD (ROUTINE X 2)
CULTURE: NO GROWTH
Culture: NO GROWTH

## 2015-02-17 NOTE — Progress Notes (Signed)
Occupational Therapy Session Note  Patient Details  Name: [name] LagosJimmy L Berkheimer MRN: [id number] Date of Birth: 06/24/1934  Today's Date: 02/17/2015 OT Individual Time: 0900-1000 OT Individual Time Calculation (min): 60 min    Short Term Goals: Week 1:  OT Short Term Goal 1 (Week 1): STGs=LTGs   Skilled Therapeutic Interventions/Progress Updates:    Pt seen for OT ADL bathing and dressing session. Pt in recliner upon arrival, wife present,  agreeable to tx. Pt transferred recliner>w/c with min-mod assist. He completedbathing  dressing task seated in w/c at the sink with steadying assist to pull pants up. Pt able to don B socks with increased time. Pt taken to therapy gym to practice static/dynamic standing balance in prep for standing ADLs. Pt stood with mod steadying assist and cues for foot placement to achieve wide BOS to complete table top task of placing rings over pole. Pt able to weight shift and reach overhead without LOB. Pt then transferred to therapy mat from w/c with min A and completed sit <> stand x 5 reps with steadying assist. Pt stated task was too easy and wanted more of a challenge. Pt taken in w/c to ADL apartment where he completed functional transfers on/ off of couch, bed mobility on standard bed, tub shower bench, and standard toilet. He completed bed mobility with supervision, and required steadying assist for all other functional transfers.    Pt returned to room at end of session, left in w/c with all needs in reach and wife present.   Therapy Documentation Precautions:  Precautions Precautions: Fall Precaution Comments: hx Right hemiparesis Restrictions Weight Bearing Restrictions: Yes RLE Weight Bearing: Weight bearing as tolerated Pain: Pain Assessment Faces Pain Scale: No hurt  See FIM for current functional status  Therapy/Group: Individual Therapy  [name], Nyaire Denbleyker C 02/17/2015, 7:38 AM

## 2015-02-17 NOTE — Progress Notes (Signed)
Physical Therapy Session Note  Patient Details  Name: [name] MRN: [id number] Date of Birth: 01/02/1934  Today's Date: 02/17/2015 PT Individual Time:1105-1210, [id number] PT Individual Time Calculation (min): 65 min , 65 min  Short Term Goals: Week 1:  PT Short Term Goal 1 (Week 1):  LTGs of overall supervision  Skilled Therapeutic Interventions/Progress Updates:   tx 1:  W/c propulsion using hemi technique x 150' with supervision.  Therapeutic exercise performed with LE to increase strength for functional mobility: bil hip abd/add, R long arc quad knee ext, bil lf raises, self stretching R hamstring in sitting using strap.  PT passively stretched R heel cord and hamstring in sitting.  Gait with LBQC x 25' with mod> min assist, mod cues for = step lengths, R knee ext; x 22' with 2x4 board on floor to widen BOS.  With use of board  = less advancement of RLE via ER and adduction, and more with hip flexion with better hip alignment, R knee extension was improved.  Attempted stairs, but pt's R knee buckled before stepping up with LLE.  After resting x 3 minutes, he was willing, and ascended/descended with step to pattern, min assist, mod cues, 1 rail    Tx 2:  Family ed with wife for general strengthening/R hip exs, with hand out. Transfer training w/c>< armchair on carpet; wife needed mod cues to direct pt appropriately, to R; repeated to L, and wife cued him appropriately.  He tends to sit before pivoting fully, and she is reluctant to correct him. Pt and wife provided with hand out for some general exs as well as bilateral bridging.   Supine> sit to L with supervision, sit> supine to R, with supervision, slowly.  Pt described the chair he sits in at home, and it sounds like a swivel chair without arms; need confirmation from son.    Therapy Documentation Precautions:  Precautions Precautions: Fall Precaution Comments: hx Right hemiparesis Restrictions Weight Bearing  Restrictions: Yes RLE Weight Bearing: Weight bearing as tolerated   Locomotion : Ambulation Ambulation/Gait Assistance: 4: Min assist;3: Mod assist Wheelchair Mobility Distance: 150       See FIM for current functional status  Therapy/Group: Individual Therapy  Addie Cederberg 02/17/2015, 5:40 PM

## 2015-02-17 NOTE — Progress Notes (Signed)
[facility] PHYSICAL MEDICINE & REHABILITATION     PROGRESS NOTE    Subjective/Complaints: Had a good night last night. Wife at bedside. Both of them are smiling. We discussed his previous functional status which was basically independent for dressing, needed some assistance for bathing. He used a Passenger transport managerelectric wheelchair but transferred independently. 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 No cough, changes in bladder habits.  Objective: Vital Signs: Blood pressure 120/66, pulse 71, temperature 98.5 F (36.9 C), temperature source Oral, resp. rate 16, height 5\' 4"  (1.626 m), SpO2 95 %. Dg Chest 2 View  02/15/2015   CLINICAL DATA:  Fever  EXAM: CHEST  2 VIEW  COMPARISON:  February 10, 2015  FINDINGS: There is a small left pleural effusion. There is no edema or consolidation. Heart is upper normal in size with pulmonary vascularity within normal limits. There is atherosclerotic change in aorta. The aorta is mildly prominent. No adenopathy. No bone lesions.  IMPRESSION: Small left pleural effusion. No edema or consolidation. Prominence of the aorta suggests chronic hypertensive change.   Electronically Signed   By: Bretta BangWilliam  Woodruff III M.D.   On: 02/15/2015 10:56   No results for input(s): WBC, HGB, HCT, PLT in the last 72 hours. No results for input(s): NA, K, CL, GLUCOSE, BUN, CREATININE, CALCIUM in the last 72 hours.  Invalid input(s): CO CBG (last 3)  No results for input(s): GLUCAP in the last 72 hours.  Wt Readings from Last 3 Encounters:  02/13/15 61.2 kg (134 lb 14.7 oz)  12/29/14 54.432 kg (120 lb)  04/26/09 48.716 kg (107 lb 6.4 oz)    Physical Exam:  Gen: appears fatigued. Slower to respond today HENT: oral mucosa pink and moist Head: Normocephalic.  Eyes: EOM are normal.  Neck: Normal range of motion. Neck supple. No thyromegaly present.  Cardiovascular: Normal rate and regular rhythm.  Respiratory: Effort normal and breath  sounds generally normal---occasional coarse breath sounds. No respiratory distress.  GI: Soft. Bowel sounds are normal. He exhibits no distension.  Musculoskeletal:  Right hip dressed, thigh tender to palpation and ROM. Adhesive capsulitis right shoulder--30 degrees abd Neurological: He is alert.  Patient can provide his name and age. Appropriate for yes and no. Follows simple commands. RUE 2 to 2+/5. RLE 1-2/5. LUE 4+/5. Alert and generally appropriate.  Tone:  MAS 2-3 throughout the RUE. RLE MAS 1-2 Skin:  Right hip wound remains intact clean and dry. No erythema/warmth near incision Psychiatric: He has a normal mood and affect. His behavior is normal. Cooperative. Fair insight and awareness. Language barrier  Assessment/Plan: 1. Functional deficits secondary to right femoral neck fracture which require 3+ hours per day of interdisciplinary therapy in a comprehensive inpatient rehab setting. Physiatrist is providing close team supervision and 24 hour management of active medical problems listed below. Physiatrist and rehab team continue to assess barriers to discharge/monitor patient progress toward functional and medical goals. FIM: FIM - Bathing Bathing Steps Patient Completed: Chest, Right Arm, Right lower leg (including foot), Left upper leg, Left lower leg (including foot), Left Arm, Abdomen, Front perineal area, Right upper leg, Buttocks Bathing: 4: Steadying assist  FIM - Upper Body Dressing/Undressing Upper body dressing/undressing steps patient completed: Thread/unthread left sleeve of front closure shirt/dress, Button/unbutton shirt, Pull shirt around back of front closure shirt/dress, Thread/unthread right sleeve of front closure shirt/dress Upper body dressing/undressing: 5: Supervision: Safety issues/verbal cues FIM - Lower Body Dressing/Undressing Lower body dressing/undressing steps patient completed: Don/Doff  left shoe, Don/Doff left sock, Don/Doff right sock, Pull pants  up/down, Thread/unthread right pants leg, Thread/unthread left pants leg Lower body dressing/undressing: 4: Min-Patient completed 75 plus % of tasks  FIM - Toileting Toileting steps completed by patient: Adjust clothing prior to toileting, Adjust clothing after toileting Toileting Assistive Devices: Grab bar or rail for support Toileting: 3: Mod-Patient completed 2 of 3 steps  FIM - Diplomatic Services operational officer Devices: Grab bars Toilet Transfers: 4-To toilet/BSC: Min A (steadying Pt. > 75%)  FIM - Bed/Chair Transfer Bed/Chair Transfer Assistive Devices: Walker, Arm rests Bed/Chair Transfer: 5: Supine > Sit: Supervision (verbal cues/safety issues), 5: Sit > Supine: Supervision (verbal cues/safety issues), 3: Bed > Chair or W/C: Mod A (lift or lower assist), 4: Chair or W/C > Bed: Min A (steadying Pt. > 75%)  FIM - Locomotion: Wheelchair Distance: 150 Locomotion: Wheelchair: 5: Travels 150 ft or more: maneuvers on rugs and over door sills with supervision, cueing or coaxing FIM - Locomotion: Ambulation Locomotion: Ambulation Assistive Devices: TEFL teacher Ambulation/Gait Assistance: 3: Mod assist Locomotion: Ambulation: 1: Travels less than 50 ft with moderate assistance (Pt: 50 - 74%)  Comprehension Comprehension Mode: Auditory Comprehension: 4-Understands basic 75 - 89% of the time/requires cueing 10 - 24% of the time  Expression Expression Mode: Verbal Expression: 4-Expresses basic 75 - 89% of the time/requires cueing 10 - 24% of the time. Needs helper to occlude trach/needs to repeat words.  Social Interaction Social Interaction: 6-Interacts appropriately with others with medication or extra time (anti-anxiety, antidepressant).  Problem Solving Problem Solving: 5-Solves basic 90% of the time/requires cueing < 10% of the time  Memory Memory: 5-Recognizes or recalls 90% of the time/requires cueing < 10% of the time  Medical Problem List and Plan: 1.  Functional deficits secondary to right femoral neck fracture. Status post cannulated hip pinning 02/10/2015. Weightbearing as tolerated  2. DVT Prophylaxis/Anticoagulation: SCDs. Check vascular study today 3. Pain Management: Oxycodone and Robaxin as needed. Monitor with decreased mobility 4. Acute blood loss anemia. Follow-up bloodwork 5. Neuropsych: This patient is capable of making decisions on his own behalf. 6. Skin/Wound Care: Routine skin checks 7. Fluids/Electrolytes/Nutrition: encourage po, regular labs 8. History of CVA 2 with right hemiparesis. Continue aspirin 9. History of Hypertension. Patient on Toprol 50 mg daily prior to admission. Resume as tolerated 10. BPH. Avodart/Flomax. Check PVRs 3, voiding fairly frequently 11.HCAP. Levaquin 3 days, afebrile currently 12. Spastic right hemiparesis, adhesive capsulitis right shoulder  - aggressive ROM. Will try low dose baclofen to see how he responds  - botox is a consideration---probably more an outpt option however 13. AMS/resolved off baclofen  LOS (Days) 4 A FACE TO FACE EVALUATION WAS PERFORMED  Claudette Laws E 02/17/2015 10:41 AM

## 2015-02-17 NOTE — Progress Notes (Signed)
Occupational Therapy Session Note  Patient Details  Name: [name] LagosJimmy L [name] MRN: [id number] Date of Birth: 12/03/1933  Today's Date: 02/17/2015 OT Individual Time: 1330-1400 OT Individual Time Calculation (min): 30 min    Short Term Goals: Week 1:  OT Short Term Goal 1 (Week 1): STGs=LTGs   Skilled Therapeutic Interventions/Progress Updates:    Pt seen for OT session focusing on functional mobility and sit <> stand. Pt in w/c upon arrival, agreeable to tx. Pt taken to ADL apartment in w/c with total A for time. Pt transferred w/c> EOB with min A and VCs for technique and positioning. He completed lateral scooting around perimeter of bed with supervision.  He completed EOB> supine with supervision. He declined rolling onto L side secondary to pain.  He transferred supine> EOB on R side with supervision and cues for technique. He was CGA to return to w/c. Pt taken back to room at end of session. He self propelled ~25 yard back to room with B UE and LEs with supervision and cues for technique.   Pt left in w/c at end of session, all needs in reach and wife present.   Therapy Documentation Precautions:  Precautions Precautions: Fall Precaution Comments: hx Right hemiparesis Restrictions Weight Bearing Restrictions: Yes RLE Weight Bearing: Weight bearing as tolerated Pain: Pain Assessment Pain Assessment: No/denies pain  See FIM for current functional status  Therapy/Group: Individual Therapy  [name], [name] 02/17/2015, 3:26 PM

## 2015-02-18 ENCOUNTER — Inpatient Hospital Stay (HOSPITAL_COMMUNITY): Payer: Medicare Other

## 2015-02-18 ENCOUNTER — Inpatient Hospital Stay (HOSPITAL_COMMUNITY): Payer: Medicare Other | Admitting: Occupational Therapy

## 2015-02-18 NOTE — Progress Notes (Signed)
Physical Therapy Session Note  Patient Details  Name: [name] MRN: [id number] Date of Birth: 03/05/1934  Today's Date: 02/18/2015 PT Individual Time: 1100-1200 PT Individual Time Calculation (min): 60 min   Session 2 Time: 1600-1630 Time Calculation (min): 30 min  Short Term Goals: Week 1:  PT Short Term Goal 1 (Week 1):  LTGs of overall supervision  Skilled Therapeutic Interventions/Progress Updates:    Session 1: Pt received seated in recliner, agreeable to participate in therapy. Session focused on standing tolerance, curb step negotiation, transfers. Instructed pt in multiple stand pivot transfers [facility]/ [facility] w/ overall ModA due intermittent buckling of RLE. Instructed pt in dynamic standing balance activity including forced use NMR of RUE/RLE w/ overall MinA to maintain balance. Pt ambulated multiple bouts of 15' w/ LBQC and ModA w/ intermittent LOB requiring MaxA to correct. Instructed pt in curb step negotiation w/ LBQC and max cueing for sequencing and Mod physical assist. Session ended in pt's room, where pt was left seated in w/c w/ family present w/ all needs within reach.    Session 2: Pt received seated in w/c, agreeable to participate in therapy. Session focused on upright tolerance, gait, HEP. Reviewed HEP with pt and wife and demonstrated each exercise to them, pt's wife instructed him in seated exercise with min cueing for technique. Multiple stand pivot transfers w/ LBQC and ModA. At edge of mat placed yoga block between pt's feet and had pt step forward with R to emphasize advancing limb in frontal plane and reduce scissoring, pt had difficulty advancing foot forward with this setup. Instructed pt in gait training w/ LBQC and HHA on R to force WBing through RUE and provide additional support during gait. Pt with improved gait but continued to fatigue quickly. Ambulated 10' w/ ModA, max cueing to wait to sit down until wheelchair was locked. Session ended in pt's room, where  pt was left seated in w/c w/ all needs within reach.    Therapy Documentation Precautions:  Precautions Precautions: Fall Precaution Comments: hx Right hemiparesis Restrictions Weight Bearing Restrictions: Yes RLE Weight Bearing: Weight bearing as tolerated Pain: Pain Assessment Pain Assessment: No/denies pain  See FIM for current functional status  Therapy/Group: Individual Therapy  [name], [name]  [name] SpangleJess Melani [name], PT, DPT 02/18/2015, 7:52 AM

## 2015-02-18 NOTE — Progress Notes (Signed)
Occupational Therapy Session Notes  Patient Details  Name: Earl LagosJimmy L Langner MRN: [id number] Date of Birth: 12/20/1933  Today's Date: 02/18/2015  Short Term Goals: Week 1:  OT Short Term Goal 1 (Week 1): STGs=LTGs   Skilled Therapeutic Interventions/Progress Updates:   Session #1 [phone] - 75 Minutes Individual Therapy No complaints of pain Patient received seated in recliner with wife present in room. Patient refused shower this am stating he just wanted to "clean up at sink". Patient transferred recliner>w/c with min assist > left side. Patient then performed UB/LB bathing and dressing tasks at sink level. Patient required min verbal cues for B&D tasks, wife present and provided support. Therapist assisted patient > ADL apartment for education on tub/shower transfers. Patient and wife are refusing use of a tub transfer bench at this time, they state that patient has a shower seat to use at home. Right now, recommending tub transfer bench secondary to decreased safety when patient attempts to step over shower and sit on seat. Patient then worked on functional ambulation using quad cane. Patient with decreased awareness regarding safety and importance of use of quad cane, therapist reiterated importance of using quad cane. Patient transferred onto bed for practice with bed mobility and BLE exercises completed in supine position. Assisted patient back to room and assist patient to recliner. Left patient seated in recliner with all needs within reach and wife present.    Session #2 1310-1340 - 30 Minutes Individual Therapy No complaints of pain Patient found seated in w/c with son and daughter-in-law present in room. Therapist assisted patient > therapy gym for therapeutic activity focusing on sit<>stands, dynamic standing balance/tolerance/endurance, weight shifting > RLE, stretching> RUE, functional ambulation using cane, and overall activity tolerance/endurance. Patient with decreased ROM and  increased spasticity throughout RUE. Discussed importance of using quad cane for all mobility with patient and patient's son. Also discussed tub/shower situation, son states that patient sits on shower seat and swings legs over. This may be okay, but would like to practice this with patient prior to him discharging>home. At end of session, left patient seated in w/c with all needs within reach and daughter-in-law present in room.   Therapy Documentation Precautions:  Precautions Precautions: Fall Precaution Comments: hx Right hemiparesis Restrictions Weight Bearing Restrictions: Yes RLE Weight Bearing: Weight bearing as tolerated  See FIM for current functional status  Therman Hughlett 02/18/2015, 7:52 AM

## 2015-02-18 NOTE — Progress Notes (Signed)
[place] PHYSICAL MEDICINE & REHABILITATION     PROGRESS NOTE    Subjective/Complaints: Wife is asking me to check his right hip. He is still having some pain in the hip. We discussed that it may take weeks or even months for the pain to subside. Review of systems positive for chronic right-sided weakness Objective: Vital Signs: Blood pressure 125/83, pulse 73, temperature 98.1 F (36.7 C), temperature source Oral, resp. rate 17, height 5\' 4"  (1.626 m), SpO2 96 %. No results found. No results for input(s): WBC, HGB, HCT, PLT in the last 72 hours. No results for input(s): NA, K, CL, GLUCOSE, BUN, CREATININE, CALCIUM in the last 72 hours.  Invalid input(s): CO CBG (last 3)  No results for input(s): GLUCAP in the last 72 hours.  Wt Readings from Last 3 Encounters:  02/13/15 61.2 kg (134 lb 14.7 oz)  12/29/14 54.432 kg (120 lb)  04/26/09 48.716 kg (107 lb 6.4 oz)    Physical Exam:  Gen: appears fatigued. Slower to respond today HENT: oral mucosa pink and moist Head: Normocephalic.  Eyes: EOM are normal.  Neck: Normal range of motion. Neck supple. No thyromegaly present.  Cardiovascular: Normal rate and regular rhythm.  Respiratory: Effort normal and breath sounds generally normal---occasional coarse breath sounds. No respiratory distress.  GI: Soft. Bowel sounds are normal. He exhibits no distension.  Musculoskeletal:  Right hip sutures intact without evidence of drainage, ecchymosis around that area mild pain to palpation as expected. Adhesive capsulitis right shoulder--30 degrees abd Neurological: He is alert.  Patient can provide his name and age. Appropriate for yes and no. Follows simple commands. RUE 2 to 2+/5. RLE 1-2/5. LUE 4+/5. Alert and generally appropriate.  Tone:  MAS 2-3 throughout the RUE. RLE MAS 1-2 Skin:  Right hip wound remains intact clean and dry. No erythema/warmth near incision Psychiatric: He has a normal mood and affect. His behavior is normal.  Cooperative. Fair insight and awareness. Language barrier  Assessment/Plan: 1. Functional deficits secondary to right femoral neck fracture which require 3+ hours per day of interdisciplinary therapy in a comprehensive inpatient rehab setting. Physiatrist is providing close team supervision and 24 hour management of active medical problems listed below. Physiatrist and rehab team continue to assess barriers to discharge/monitor patient progress toward functional and medical goals. FIM: FIM - Bathing Bathing Steps Patient Completed: Chest, Right Arm, Right lower leg (including foot), Left upper leg, Left lower leg (including foot), Left Arm, Abdomen, Front perineal area, Right upper leg Bathing: 4: Min-Patient completes 8-9 [id number] 10 parts or 75+ percent  FIM - Upper Body Dressing/Undressing Upper body dressing/undressing steps patient completed: Thread/unthread left sleeve of front closure shirt/dress, Button/unbutton shirt, Pull shirt around back of front closure shirt/dress, Thread/unthread right sleeve of front closure shirt/dress Upper body dressing/undressing: 5: Set-up assist to: Obtain clothing/put away FIM - Lower Body Dressing/Undressing Lower body dressing/undressing steps patient completed: Don/Doff left shoe, Don/Doff left sock, Don/Doff right sock, Pull pants up/down, Thread/unthread right pants leg, Thread/unthread left pants leg, Don/Doff right shoe Lower body dressing/undressing: 5: Supervision: Safety issues/verbal cues  FIM - Toileting Toileting steps completed by patient: Adjust clothing prior to toileting, Adjust clothing after toileting Toileting Assistive Devices: Grab bar or rail for support Toileting: 3: Mod-Patient completed 2 of 3 steps  FIM - Diplomatic Services operational officerToilet Transfers Toilet Transfers Assistive Devices: Grab bars Toilet Transfers: 4-To toilet/BSC: Min A (steadying Pt. > 75%)  FIM - BankerBed/Chair Transfer Bed/Chair Transfer Assistive Devices: Cane (quad cane) Bed/Chair Transfer:  4:  Supine > Sit: Min A (steadying Pt. > 75%/lift 1 leg), 5: Sit > Supine: Supervision (verbal cues/safety issues), 4: Chair or W/C > Bed: Min A (steadying Pt. > 75%)  FIM - Locomotion: Wheelchair Distance: 150 Locomotion: Wheelchair: 5: Travels 150 ft or more: maneuvers on rugs and over door sills with supervision, cueing or coaxing FIM - Locomotion: Ambulation Locomotion: Ambulation Assistive Devices: Occupational hygienist Ambulation/Gait Assistance: 4: Min assist, 3: Mod assist Locomotion: Ambulation: 1: Travels less than 50 ft with moderate assistance (Pt: 50 - 74%)  Comprehension Comprehension Mode: Auditory Comprehension: 4-Understands basic 75 - 89% of the time/requires cueing 10 - 24% of the time  Expression Expression Mode: Verbal Expression: 4-Expresses basic 75 - 89% of the time/requires cueing 10 - 24% of the time. Needs helper to occlude trach/needs to repeat words.  Social Interaction Social Interaction: 6-Interacts appropriately with others with medication or extra time (anti-anxiety, antidepressant).  Problem Solving Problem Solving: 5-Solves basic 90% of the time/requires cueing < 10% of the time  Memory Memory: 5-Recognizes or recalls 90% of the time/requires cueing < 10% of the time  Medical Problem List and Plan: 1. Functional deficits secondary to right femoral neck fracture. Status post cannulated hip pinning 02/10/2015. Weightbearing as tolerated  2. DVT Prophylaxis/Anticoagulation: SCDs. Check vascular study today 3. Pain Management: Oxycodone and Robaxin as needed. Monitor with decreased mobility 4. Acute blood loss anemia. Follow-up bloodwork 5. Neuropsych: This patient is capable of making decisions on his own behalf. 6. Skin/Wound Care: Routine skin checks 7. Fluids/Electrolytes/Nutrition: encourage po, regular labs 8. History of CVA 2 with right hemiparesis. Continue aspirin 9. History of Hypertension. Patient on Toprol 50 mg daily prior to admission. Resume  as tolerated 10. BPH. Avodart/Flomax. Check PVRs 3, voiding fairly frequently 11.HCAP. Levaquin 3 days, afebrile currently 12. Spastic right hemiparesis, adhesive capsulitis right shoulder  - aggressive ROM. Will try low dose baclofen to see how he responds  - botox is a consideration---probably more an outpt option however 13. AMS/resolved off baclofen  LOS (Days) 5 A FACE TO FACE EVALUATION WAS PERFORMED  Claudette Laws E 02/18/2015 10:46 AM

## 2015-02-19 ENCOUNTER — Inpatient Hospital Stay (HOSPITAL_COMMUNITY): Payer: Medicare Other

## 2015-02-19 NOTE — Progress Notes (Signed)
[place] PHYSICAL MEDICINE & REHABILITATION     PROGRESS NOTE    Subjective/Complaints: Hip feels better today. Wife asked about ice for right hip. Overall quiet weekend Review of systems positive for chronic right-sided weakness Objective: Vital Signs: Blood pressure 109/67, pulse 62, temperature 98.1 F (36.7 C), temperature source Oral, resp. rate 17, height 5\' 4"  (1.626 m), SpO2 98 %. No results found. No results for input(s): WBC, HGB, HCT, PLT in the last 72 hours. No results for input(s): NA, K, CL, GLUCOSE, BUN, CREATININE, CALCIUM in the last 72 hours.  Invalid input(s): CO CBG (last 3)  No results for input(s): GLUCAP in the last 72 hours.  Wt Readings from Last 3 Encounters:  02/13/15 61.2 kg (134 lb 14.7 oz)  12/29/14 54.432 kg (120 lb)  04/26/09 48.716 kg (107 lb 6.4 oz)    Physical Exam:  Gen: appears fatigued. Slower to respond today HENT: oral mucosa pink and moist Head: Normocephalic.  Eyes: EOM are normal.  Neck: Normal range of motion. Neck supple. No thyromegaly present.  Cardiovascular: Normal rate and regular rhythm.  Respiratory: Effort normal and breath sounds generally normal---occasional coarse breath sounds. No respiratory distress.  GI: Soft. Bowel sounds are normal. He exhibits no distension.  Musculoskeletal:  Right hip sutures intact. Wound nicely approximated. Mild bruising in right thigh. Adhesive capsulitis right shoulder--30 degrees abd Neurological: He is alert.  Patient can provide his name and age. Appropriate for yes and no. Follows simple commands. RUE 2 to 2+/5. RLE 1-2/5. LUE 4+/5. Alert and generally appropriate.  Tone:  MAS 2-3 throughout the RUE. RLE MAS 1-2 Skin:  Right hip wound remains intact clean and dry. No erythema/warmth near incision Psychiatric: He has a normal mood and affect. His behavior is normal. Cooperative. Fair insight and awareness. Language barrier  Assessment/Plan: 1. Functional deficits secondary  to right femoral neck fracture which require 3+ hours per day of interdisciplinary therapy in a comprehensive inpatient rehab setting. Physiatrist is providing close team supervision and 24 hour management of active medical problems listed below. Physiatrist and rehab team continue to assess barriers to discharge/monitor patient progress toward functional and medical goals. FIM: FIM - Bathing Bathing Steps Patient Completed: Chest, Right Arm, Right lower leg (including foot), Left upper leg, Left lower leg (including foot), Left Arm, Abdomen, Front perineal area, Right upper leg Bathing: 4: Min-Patient completes 8-9 [id number] 10 parts or 75+ percent  FIM - Upper Body Dressing/Undressing Upper body dressing/undressing steps patient completed: Thread/unthread left sleeve of front closure shirt/dress, Button/unbutton shirt, Pull shirt around back of front closure shirt/dress, Thread/unthread right sleeve of front closure shirt/dress Upper body dressing/undressing: 5: Set-up assist to: Obtain clothing/put away FIM - Lower Body Dressing/Undressing Lower body dressing/undressing steps patient completed: Don/Doff left shoe, Don/Doff left sock, Don/Doff right sock, Pull pants up/down, Thread/unthread right pants leg, Thread/unthread left pants leg, Don/Doff right shoe Lower body dressing/undressing: 5: Supervision: Safety issues/verbal cues  FIM - Toileting Toileting steps completed by patient: Adjust clothing prior to toileting, Adjust clothing after toileting Toileting Assistive Devices: Grab bar or rail for support Toileting: 3: Mod-Patient completed 2 of 3 steps  FIM - Diplomatic Services operational officerToilet Transfers Toilet Transfers Assistive Devices: Grab bars Toilet Transfers: 4-To toilet/BSC: Min A (steadying Pt. > 75%)  FIM - BankerBed/Chair Transfer Bed/Chair Transfer Assistive Devices: Cane (quad cane) Bed/Chair Transfer: 4: Bed > Chair or W/C: Min A (steadying Pt. > 75%), 4: Chair or W/C > Bed: Min A (steadying Pt. > 75%)  FIM -  Locomotion: Wheelchair Distance: 150 Locomotion: Wheelchair: 1: Total Assistance/staff pushes wheelchair (Pt<25%) FIM - Locomotion: Ambulation Locomotion: Ambulation Assistive Devices: Occupational hygienist Ambulation/Gait Assistance: 3: Mod assist, 4: Min assist, 2: Max assist Locomotion: Ambulation: 1: Travels less than 50 ft with maximal assistance (Pt: 25 - 49%)  Comprehension Comprehension Mode: Auditory Comprehension: 4-Understands basic 75 - 89% of the time/requires cueing 10 - 24% of the time  Expression Expression Mode: Verbal Expression: 4-Expresses basic 75 - 89% of the time/requires cueing 10 - 24% of the time. Needs helper to occlude trach/needs to repeat words.  Social Interaction Social Interaction: 6-Interacts appropriately with others with medication or extra time (anti-anxiety, antidepressant).  Problem Solving Problem Solving: 5-Solves basic 90% of the time/requires cueing < 10% of the time  Memory Memory: 5-Recognizes or recalls 90% of the time/requires cueing < 10% of the time  Medical Problem List and Plan: 1. Functional deficits secondary to right femoral neck fracture. Status post cannulated hip pinning 02/10/2015. Weightbearing as tolerated   -remove sutures today 2. DVT Prophylaxis/Anticoagulation: SCDs.   3. Pain Management: Oxycodone and Robaxin as needed. Monitor with decreased mobility 4. Acute blood loss anemia. Recheck cbc in am 5. Neuropsych: This patient is capable of making decisions on his own behalf. 6. Skin/Wound Care: Routine skin checks 7. Fluids/Electrolytes/Nutrition: encourage po, regular labs 8. History of CVA 2 with right hemiparesis. Continue aspirin 9. History of Hypertension. Patient on Toprol 50 mg daily prior to admission. Resume as tolerated 10. BPH. Avodart/Flomax.  --at baseline 11.HCAP. Levaquin 3 days, afebrile currently 12. Spastic right hemiparesis, adhesive capsulitis right shoulder  - aggressive ROM. Unable to tolerate  baclofen  - botox as an outpt 13. AMS/resolved off baclofen  LOS (Days) 6 A FACE TO FACE EVALUATION WAS PERFORMED  [name] T 02/19/2015 8:25 AM

## 2015-02-19 NOTE — Progress Notes (Signed)
Physical Therapy Session Note  Patient Details  Name: [name] LagosJimmy L Tolles MRN: [id number] Date of Birth: 08/25/1934  Today's Date: 02/19/2015 PT Individual Time: 0800-0900 PT Individual Time Calculation (min): 60 min   Short Term Goals: Week 1:  PT Short Term Goal 1 (Week 1):  LTGs of overall supervision  Skilled Therapeutic Interventions/Progress Updates:   Pt presents in recliner with hospital gown on. Donned clothing in preparation for out of room with overall min A from therapist and therapist donned Tedhose on RLE with total A. Sit to stands to pull up pants with close S and cues for technique. Pt able to transfer to w/c with close S from recliner using [facility] for support. W/c propulsion for general strengthening and endurance > 200' with S and extra time. Practiced simulated car transfer including short distance gait with WBQC to and from car with overall min A and cues for technique and hand placement. Gait training with [facility] using green line on floor for visual feedback to encourage wider stance with [facility] x 33' x 2 with seated rest breaks with steady A overall and verbal cues for posture and technique. Stretching to RLE in seated position for knee extension and activating flexion/extension with education to wife and pt in regards to importance of stretching for ROM and how it affects transfers and gait. Both verbalized understanding. Pt overall steady A for transfers during session.  Therapy Documentation Precautions:  Precautions Precautions: Fall Precaution Comments: hx Right hemiparesis Restrictions Weight Bearing Restrictions: Yes RLE Weight Bearing: Weight bearing as tolerated General:   Pain: Reports "a little" R hip pain with use of RLE. Rest breaks as needed.  See FIM for current functional status  Therapy/Group: Individual Therapy  Karolee StampsGray, Brodi Nery Darrol PokeBrescia  Arianna Haydon B. Orah Sonnen, PT, DPT  02/19/2015, 10:00 AM

## 2015-02-19 NOTE — Care Management Note (Addendum)
Inpatient Rehabilitation Center Individual Statement of Services  Patient Name:  [name]  Date:  02/16/2015  Welcome to the Inpatient Rehabilitation Center.  Our goal is to provide you with an individualized program based on your diagnosis and situation, designed to meet your specific needs.  With this comprehensive rehabilitation program, you will be expected to participate in at least 3 hours of rehabilitation therapies Monday-Friday, with modified therapy programming on the weekends.  Your rehabilitation program will include the following services:  Physical Therapy (PT), Occupational Therapy (OT), 24 hour per day rehabilitation nursing, Therapeutic Recreaction (TR), Case Management (Social Worker), Rehabilitation Medicine, Nutrition Services and Pharmacy Services  Weekly team conferences will be held on Tuesdays to discuss your progress.  Your Social Worker will talk with you frequently to get your input and to update you on team discussions.  Team conferences with you and your family in attendance may also be held.  Expected length of stay: 10-12 days  Overall anticipated outcome: supervisioin  Depending on your progress and recovery, your program may change. Your Social Worker will coordinate services and will keep you informed of any changes. Your Social Worker's name and contact numbers are listed  below.  The following services may also be recommended but are not provided by the Inpatient Rehabilitation Center:    Home Health Rehabiltiation Services  Outpatient Rehabilitation Services    Arrangements will be made to provide these services after discharge if needed.  Arrangements include referral to agencies that provide these services.  Your insurance has been verified to be:  Medicare or BCBS Your primary doctor is:  Dr. Azucena CecilSwayne  Pertinent information will be shared with your doctor and your insurance company.  Social Worker:  [place]lequah[place]cy Alyissa Whidbee, [place] [phone] or [phone]) [phone]    Information discussed with and copy given to patient by: Amada JupiterHOYLE, [name], 02/16/2015, 10:39 AM

## 2015-02-19 NOTE — Progress Notes (Signed)
Occupational Therapy Note  Patient Details  Name: [name] LagosJimmy L [name] MRN: [id number] Date of Birth: 08/10/1934  Today's Date: 02/19/2015 OT Individual Time: 1400-1430 OT Individual Time Calculation (min): 30 min   Pt denied pain Individual Therapy  Pt propelled to therapy gym and engaged in dynamic standing tasks including reaching for objects are varying heights and across midline.  Pt performed standing tasks X 4 with rest breaks between each task.  Pt required min verbal cues for BLE placement in preparation for sit<>stand and to maintain standing balance.  Pt's wife present.  Pt propelled back to room and transferred to recliner.  Wife remained in room.  Focus on activity tolerance, w/c mobility, sit<>stand, standing balance, and safety awareness.   [name], [name] [facility] CenterChappell 02/19/2015, 2:42 PM

## 2015-02-19 NOTE — Progress Notes (Signed)
Occupational Therapy Note  Patient Details  Name: [name] MRN: [id number] Date of Birth: 02/28/1934  Today's Date: 02/19/2015 OT Individual Time: 1100-1200 OT Individual Time Calculation (min): 60 min   Pt c/o increased pain in RLE with weight bearing; pt unable to rate; RN aware and repositioned Individual Therapy  Pt's wife present during therapy.  Pt practiced tub bench transfers X 4 (squat pivot X 2 and stand pivot with SBQC X 2).  Pt and wife in agreement that a tub transfer bench is needed at home.  Pt performed squat pivot with close supervision and stand pivot with stead A.  Focus on functional transfers, family education, discharge planning, and safety awareness.   [name], [name] 02/19/2015, 12:02 PM

## 2015-02-19 NOTE — Progress Notes (Signed)
Social Work  Social Work Assessment and Plan  Patient Details  Name: [name] LagosJimmy L Duva MRN: [id number] Date of Birth: 01/02/1934  Today's Date: 02/16/2015  Problem List:  Patient Active Problem List   Diagnosis Date Noted  . Femoral neck fracture 02/13/2015  . Acute respiratory failure with hypoxia 02/11/2015  . HCAP (healthcare-associated pneumonia) 02/11/2015  . Sepsis 02/11/2015  . Microcytic anemia 02/10/2015  . Thrombocytopenia 02/10/2015  . Cerebral infarction due to other mechanism   . Inguinal hernia 02/09/2015  . Fracture of femoral neck, right 02/09/2015  . Closed right hip fracture 02/09/2015  . Fractured hip 02/09/2015  . Tuberculosis of lung, infiltrative 04/26/2009  . Essential hypertension 04/26/2009  . Right spastic hemiparesis 04/26/2009  . OVERACTIVE BLADDER 04/26/2009  . Anorexia 04/26/2009   Past Medical History:  Past Medical History  Diagnosis Date  . Stroke   . Hypertension   . Enlarged prostate   . Inguinal hernia    Past Surgical History:  Past Surgical History  Procedure Laterality Date  . Brain surgery    . Hip pinning,cannulated Right 02/10/2015    Procedure: CANNULATED HIP PINNING;  Surgeon: Sheral Apleyimothy D Murphy, MD;  [facility];  Service: Orthopedics;  Laterality: Right;   Social History:  reports that he has never smoked. He has never used smokeless tobacco. He reports that he does not drink alcohol or use illicit drugs.  Family / Support Systems Marital Status: Married How Long?: 52 yrs Patient Roles: Spouse, Parent Spouse/Significant Other: wife, Lazaro ArmsLinda Pudwill @ [phone](C) [phone] Children: one son, Felecia Shellingric Mele @ [phone](C) [phone] or [phone]) [phone] Anticipated Caregiver: wife and son Ability/Limitations of Caregiver: wife can provide supervision level Caregiver Availability: 24/7 Family Dynamics: Wife very attentive and submissive to her husband.  Wife states, "This (pointing to pt) is my life 24 hours a day."  Social History Preferred  language: Other Religion: Non-Denominational Cultural Background: Pt originally from [place] and has lived in the [place] for over 40 yrs Education: college educated in [place] and served in [employer] [place] Read: Yes Write: Yes Employment Status: Retired Fish farm managerLegal Hisotry/Current Legal Issues: none Guardian/Conservator: none - per MD, pt capable of making decisions on his own behalf   Abuse/Neglect Physical Abuse: Denies Verbal Abuse: Denies Sexual Abuse: Denies Exploitation of patient/patient's resources: Denies Self-Neglect: Denies  Emotional Status Pt's affect, behavior adn adjustment status: Pt sits quietly while his wife completes interview on his behalf due to his limited [place].  Towards end of discussion, pt rouses and asks when his next therapy is.  He does not appear to be in any emotional distress.  Wife denies any issues but does describe pt as "mean".  Reports "he has been mean since his stroke..."  Recent Psychosocial Issues: None Pyschiatric History: None Substance Abuse History: None  Patient / Family Perceptions, Expectations & Goals Pt/Family understanding of illness & functional limitations: Pt, wife and son with basic understanding of his injury.  Wife unable to name precautions.  Basic understanding of his current functional limitations/ need for CIR and of CIR process (here x2 after prior CVAs) Premorbid pt/family roles/activities: Pt was independent at home using rolling walker, however, wife providing 24/7 supervision and home maintenance Anticipated changes in roles/activities/participation: Little change anticipated if pt able to reach supervision goals. Pt/family expectations/goals: "I need him to be able to walk on his own"  [employer] Community Agencies: None Premorbid Home Care/DME Agencies: Other (Comment) [facility]) Transportation available at discharge: yes  Discharge Planning Living Arrangements: Spouse/significant  other Support Systems:  Spouse/significant other, Children Type of Residence: Private residence Insurance Resources: [employer], Media planner (specify) Herbalist) Financial Resources: Restaurant manager, fast food Screen Referred: No Living Expenses: Own Money Management: Spouse Does the patient have any problems obtaining your medications?: No Home Management: wife Patient/Family Preliminary Plans: Plan for pt to d/c home with his wife as primary caregiver.  Son can provide intermittent support as well. Social Work Anticipated Follow Up Needs: HH/OP Expected length of stay: 10-12 days  Clinical Impression [place] speaking gentleman here following a fall with hip fx.  Very limited english and wife completes interview with this SW on his behalf.  Wife able to provide 24/7 supervision at home as she was doing this for several years already.  No emotional distress reported by wife.  Son very supportive and can provide intermittent support.  Danel Requena 02/16/2015, 10:34 AM

## 2015-02-19 NOTE — Progress Notes (Signed)
Occupational Therapy Session Note  Patient Details  Name: [name] MRN: [id number] Date of Birth: 11/01/1934  Today's Date: 02/19/2015 OT Individual Time: 0900-1000 OT Individual Time Calculation (min): 60 min    Short Term Goals: Week 1:  OT Short Term Goal 1 (Week 1): STGs=LTGs   Skilled Therapeutic Interventions/Progress Updates:    Pt engaged in BADL retraining including bathing at shower level and dressing with sit<>stand from w/c.  Pt uses RUE functionally to hold shower hose and to assist with donning pants.  Pt required steady A with standing in shower to bathe buttocks.  Pt requires extra time to complete tasks.  Focus on activity tolerance, sit<>stand, standing balance, functional transfers, family education, and safety awareness.  Therapy Documentation Precautions:  Precautions Precautions: Fall Precaution Comments: hx Right hemiparesis Restrictions Weight Bearing Restrictions: Yes RLE Weight Bearing: Weight bearing as tolerated Pain: Pain Assessment Pain Assessment: No/denies pain  See FIM for current functional status  Therapy/Group: Individual Therapy  [name], Jaremy Nosal Chappell 02/19/2015, 10:02 AM

## 2015-02-20 ENCOUNTER — Inpatient Hospital Stay (HOSPITAL_COMMUNITY): Payer: Medicare Other

## 2015-02-20 LAB — CBC
HCT: 33.2 % — ABNORMAL LOW (ref 39.0–52.0)
Hemoglobin: 10.3 g/dL — ABNORMAL LOW (ref 13.0–17.0)
MCH: 22.8 pg — ABNORMAL LOW (ref 26.0–34.0)
MCHC: 31 g/dL (ref 30.0–36.0)
MCV: 73.5 fL — ABNORMAL LOW (ref 78.0–100.0)
PLATELETS: 139 10*3/uL — AB (ref 150–400)
RBC: 4.52 MIL/uL (ref 4.22–5.81)
RDW: 14.1 % (ref 11.5–15.5)
WBC: 9.5 10*3/uL (ref 4.0–10.5)

## 2015-02-20 NOTE — Progress Notes (Signed)
Physical Therapy Session Note  Patient Details  Name: [name] LagosJimmy L Delaluz MRN: [id number] Date of Birth: 09/08/1934  Today's Date: 02/20/2015 PT Individual Time: 1100-1200 PT Individual Time Calculation (min): 60 min   Short Term Goals: Week 1:  PT Short Term Goal 1 (Week 1):  LTGs of overall supervision  Skilled Therapeutic Interventions/Progress Updates:    Session focused on functional transfers, gait with [facility], neuro re-ed for balance and functional use of RUE, w/c mobility with S for functional strengthening and endurance, and curb step negotiation with [facility] with min A and 1 episode of mod A due to fatigue x 2 reps up/down (cues for technique needed throughout). Pt's gait with steady A and close S with cues for increased step length, upright posture, and increased foot clearance on RLE x 60'. Neuro re-ed in standing for dynamic balance while reaching for horseshoes with RUE and cues to increased WB to RLE and then performing fine motor task manipulation with peg placement into peg board in standing x 10 min with close S. Pt's son present during session and educated on importance of continued mobility at home and encouragement of functional use of RUE.   Therapy Documentation Precautions:  Precautions Precautions: Fall Precaution Comments: hx Right hemiparesis Restrictions Weight Bearing Restrictions: Yes RLE Weight Bearing: Weight bearing as tolerated Pain: Intermittent complaints of R hip pain during WB but able to work through it.    Locomotion : Ambulation Ambulation/Gait Assistance: 5: Supervision;4: Min guard   See FIM for current functional status  Therapy/Group: Individual Therapy  Karolee StampsGray, Marija Calamari Darrol PokeBrescia  Antoinett Dorman B. Newman Waren, PT, DPT  02/20/2015, 12:29 PM

## 2015-02-20 NOTE — Progress Notes (Signed)
Occupational Therapy Session Note  Patient Details  Name: [name] MRN: [id number] Date of Birth: 04/23/1934  Today's Date: 02/20/2015 OT Individual Time: 0800-0900 OT Individual Time Calculation (min): 60 min    Short Term Goals: Week 1:  OT Short Term Goal 1 (Week 1): STGs=LTGs   Skilled Therapeutic Interventions/Progress Updates:    Pt engaged in BADL retraining including bathing at tub/shower level and dressing with sit<>stand from seat.  Pt's wife present for therapy.  Pt required min A for transfer from tub seat to w/c.  Pt completes dressing tasks with close supervision and extra time to complete tasks.  Pt's son stated that he already has a tub bench at home.  Focus on continued patient/family education, functional transfers, sit<>stand, standing balance, and safety awareness.  Therapy Documentation Precautions:  Precautions Precautions: Fall Precaution Comments: hx Right hemiparesis Restrictions Weight Bearing Restrictions: Yes RLE Weight Bearing: Weight bearing as tolerated   Pain: Pain Assessment Pain Assessment: 0-10 Pain Score: 4  Pain Type: Surgical pain;Acute pain Pain Location: Hip Pain Orientation: Right;Upper Pain Descriptors / Indicators: Aching Pain Onset: With Activity Pain Intervention(s): RN made aware;Repositioned  See FIM for current functional status  Therapy/Group: Individual Therapy  [name] BraveLanier, [name] 02/20/2015, 11:00 AM

## 2015-02-20 NOTE — Progress Notes (Signed)
Occupational Therapy Note  Patient Details  Name: [name] MRN: [id number] Date of Birth: 12/23/1933  Today's Date: 02/20/2015 OT Individual Time: 1330-1400 OT Individual Time Calculation (min): 30 min   Pt denied pain Individual therapy  Pt propelled w/c to therapy gym and engaged in dynamic standing tasks.  Pt reached across midline with LUE to retrieve horseshoes from elevated surface and tossing them to target.  Pt performed tasks 8 X 3 with rest breaks between sets.  Pt required steady A when performing tasks.  Pt transitioned to practicing stand/squat pivot transfers to right and to left (mat<>w/c) with focus on safety awareness.  Pt performed transfers with close supervision.  Pt propelled w/c back to room and transferred to recliner.  Focus on dynamic standing balance, funcitonal transfers, w/c mobility, continued family education, and safety awareness.   Lavone NeriLanier, Ijanae Macapagal [facility] 02/20/2015, 2:15 PM

## 2015-02-20 NOTE — Progress Notes (Signed)
[place] PHYSICAL MEDICINE & REHABILITATION     PROGRESS NOTE    Subjective/Complaints: Right hip still sore. Has questions as to how long it will hurt.  Review of systems positive for chronic right-sided weakness Objective: Vital Signs: Blood pressure 109/67, pulse 64, temperature 98 F (36.7 C), temperature source Oral, resp. rate 18, height  (1.626 m), SpO2 98 %. No results found. No results for input(s): WBC, HGB, HCT, PLT in the last 72 hours. No results for input(s): NA, K, CL, GLUCOSE, BUN, CREATININE, CALCIUM in the last 72 hours.  Invalid input(s): CO CBG (last 3)  No results for input(s): GLUCAP in the last 72 hours.  Wt Readings from Last 3 Encounters:  02/13/15 61.2 kg (134 lb 14.7 oz)  12/29/14 54.432 kg (120 lb)  04/26/09 48.716 kg (107 lb 6.4 oz)    Physical Exam:  Gen: appears fatigued. Slower to respond today HENT: oral mucosa pink and moist Head: Normocephalic.  Eyes: EOM are normal.  Neck: Normal range of motion. Neck supple. No thyromegaly present.  Cardiovascular: Normal rate and regular rhythm.  Respiratory: Effort normal and breath sounds generally normal---occasional coarse breath sounds. No respiratory distress.  GI: Soft. Bowel sounds are normal. He exhibits no distension.  Musculoskeletal:  Right hip sutures intact. Wound nicely approximated. Mild bruising in right thigh. Adhesive capsulitis right shoulder--30 degrees abd Neurological: He is alert.  Patient can provide his name and age. Appropriate for yes and no. Follows simple commands. RUE 2 to 2+/5. RLE 1-2/5. LUE 4+/5. Alert and generally appropriate.  Tone:  MAS 2-3 throughout the RUE. RLE MAS 1-2 Skin:  Right hip wound remains intact clean and dry. No erythema/warmth near incision Psychiatric: He has a normal mood and affect. His behavior is normal. Cooperative. Fair insight and awareness. Language barrier  Assessment/Plan: 1. Functional deficits secondary to right femoral  neck fracture which require 3+ hours per day of interdisciplinary therapy in a comprehensive inpatient rehab setting. Physiatrist is providing close team supervision and 24 hour management of active medical problems listed below. Physiatrist and rehab team continue to assess barriers to discharge/monitor patient progress toward functional and medical goals. FIM: FIM - Bathing Bathing Steps Patient Completed: Chest, Right Arm, Right lower leg (including foot), Left upper leg, Left lower leg (including foot), Left Arm, Abdomen, Front perineal area, Right upper leg Bathing: 4: Min-Patient completes 8-9 2f 10 parts or 75+ percent  FIM - Upper Body Dressing/Undressing Upper body dressing/undressing steps patient completed: Thread/unthread left sleeve of front closure shirt/dress, Button/unbutton shirt, Pull shirt around back of front closure shirt/dress, Thread/unthread right sleeve of front closure shirt/dress Upper body dressing/undressing: 5: Set-up assist to: Obtain clothing/put away FIM - Lower Body Dressing/Undressing Lower body dressing/undressing steps patient completed: Don/Doff left sock, Don/Doff right sock, Pull pants up/down, Thread/unthread right pants leg, Thread/unthread left pants leg, Don/Doff left shoe, Don/Doff right shoe Lower body dressing/undressing: 5: Supervision: Safety issues/verbal cues  FIM - Toileting Toileting steps completed by patient: Adjust clothing prior to toileting, Adjust clothing after toileting Toileting Assistive Devices: Grab bar or rail for support Toileting: 3: Mod-Patient completed 2 of 3 steps  FIM - Diplomatic Services operational officer Devices: Grab bars Toilet Transfers: 4-To toilet/BSC: Min A (steadying Pt. > 75%)  FIM - Banker Devices: Cane (quad cane) Bed/Chair Transfer: 4: Bed > Chair or W/C: Min A (steadying Pt. > 75%), 4: Chair or W/C > Bed: Min A (steadying Pt. > 75%)  FIM -  Locomotion:  Wheelchair Distance: 150 Locomotion: Wheelchair: 1: Total Assistance/staff pushes wheelchair (Pt<25%) FIM - Locomotion: Ambulation Locomotion: Ambulation Assistive Devices: Occupational hygienistCane - Quad Ambulation/Gait Assistance: 3: Mod assist, 4: Min assist, 2: Max assist Locomotion: Ambulation: 1: Travels less than 50 ft with maximal assistance (Pt: 25 - 49%)  Comprehension Comprehension Mode: Auditory Comprehension: 4-Understands basic 75 - 89% of the time/requires cueing 10 - 24% of the time  Expression Expression Mode: Verbal Expression: 4-Expresses basic 75 - 89% of the time/requires cueing 10 - 24% of the time. Needs helper to occlude trach/needs to repeat words.  Social Interaction Social Interaction: 6-Interacts appropriately with others with medication or extra time (anti-anxiety, antidepressant).  Problem Solving Problem Solving: 5-Solves basic 90% of the time/requires cueing < 10% of the time  Memory Memory: 5-Recognizes or recalls 90% of the time/requires cueing < 10% of the time  Medical Problem List and Plan: 1. Functional deficits secondary to right femoral neck fracture. Status post cannulated hip pinning 02/10/2015. Weightbearing as tolerated   -removed sutures without issues 2. DVT Prophylaxis/Anticoagulation: SCDs.   3. Pain Management: tylenol, ice 4. Acute blood loss anemia. Dietary sources 5. Neuropsych: This patient is capable of making decisions on his own behalf. 6. Skin/Wound Care: Routine skin checks 7. Fluids/Electrolytes/Nutrition: encourage po, regular labs 8. History of CVA 2 with right hemiparesis. Continue aspirin 9. History of Hypertension. Patient on Toprol 50 mg daily prior to admission. Resume as tolerated 10. BPH. Avodart/Flomax.  --at baseline 11.HCAP. Levaquin 3 days, afebrile currently 12. Spastic right hemiparesis, adhesive capsulitis right shoulder  - aggressive ROM. Unable to tolerate baclofen  - botox as an outpt 13. AMS/resolved off  baclofen  LOS (Days) 7 A FACE TO FACE EVALUATION WAS PERFORMED  SWARTZ,ZACHARY T 02/20/2015 8:12 AM

## 2015-02-20 NOTE — Progress Notes (Signed)
Physical Therapy Session Note  Patient Details  Name: [name] LagosJimmy L Kierstead MRN: [id number] Date of Birth: 02/23/1934  Today's Date: 02/20/2015 PT Individual Time: 0900-1000 PT Individual Time Calculation (min): 60 min   Short Term Goals: Week 1:  PT Short Term Goal 1 (Week 1):  LTGs of overall supervision  Skilled Therapeutic Interventions/Progress Updates:    Pt received seated in w/c, agreeable to participate in therapy. Session focused on family training, gait training, LE HEP. Pt [facility]/c w/ pt's wife providing close supervision and intermittent CGA for transfer. Instructed pt and wife on completing car transfer [facility]/ [facility]. In[facility]ed pt [facility]/ [facility] with visual feedback from green line on floor to widen stance during gait, pt ambulated w/ CGA, no buckling noted. Instructed pt in completing LE HEP including heel slides, ankle pumps, and supine hip abd/add. Instructed pt in x10 sit<>stands w/ emphasis on sliding R foot back under his knee. Pt tolerated session well. Pt left seated in w/c w/ wife present and all needs within reach. Pt's wife is safe to transfer him bed<>w/c in room.   Therapy Documentation Precautions:  Precautions Precautions: Fall Precaution Comments: hx Right hemiparesis Restrictions Weight Bearing Restrictions: Yes RLE Weight Bearing: Weight bearing as tolerated Pain: Pain Assessment Pain Assessment: 0-10 Pain Score: 3 Pain Type: Surgical pain;Acute pain Pain Location: Hip Pain Orientation: Right;Upper Pain Descriptors / Indicators: Aching Pain Onset: With Activity Pain Intervention(s): Repositioned  See FIM for current functional status  Therapy/Group: Individual Therapy  Hosie SpangleGodfrey, Jancarlo Biermann  Hosie SpangleJess Windell Musson, PT, DPT 02/20/2015, 7:35 AM

## 2015-02-21 ENCOUNTER — Inpatient Hospital Stay (HOSPITAL_COMMUNITY): Payer: Medicare Other

## 2015-02-21 ENCOUNTER — Inpatient Hospital Stay (HOSPITAL_COMMUNITY): Payer: Medicare Other | Admitting: Physical Therapy

## 2015-02-21 NOTE — Discharge Summary (Signed)
Discharge summary job [id number]

## 2015-02-21 NOTE — Progress Notes (Signed)
Occupational Therapy Session Note  Patient Details  Name: [name] MRN: [id number] Date of Birth: 10/11/1934  Today's Date: 02/21/2015 OT Individual Time: 0800-0900 OT Individual Time Calculation (min): 60 min    Short Term Goals: Week 1:  OT Short Term Goal 1 (Week 1): STGs=LTGs   Skilled Therapeutic Interventions/Progress Updates:    Pt engaged in BADL retraining including bathing, dressing, tub bench transfers, toilet transfers, and toileting.  Pt's wife and son present.  Pt performed all tasks at supervision level.  Pt requires extra time to complete all tasks.  Pt's son stated that a tub bench is placed in tub at home and displayed picture to assure proper placement.  Focus on continued family education, BADLs, functional transfers, discharge planning, and safety awareness.   Therapy Documentation Precautions:  Precautions Precautions: Fall Precaution Comments: hx Right hemiparesis Restrictions Weight Bearing Restrictions: Yes RLE Weight Bearing: Weight bearing as tolerated   Pain: Pain Assessment Pain Assessment: No/denies pain Pain Score: 4  Pain Type: Surgical pain Pain Location: Hip Pain Orientation: Right;Upper Pain Descriptors / Indicators: Aching Pain Onset: With Activity  See FIM for current functional status  Therapy/Group: Individual Therapy  [name], [name] 02/21/2015, 12:14 PM

## 2015-02-21 NOTE — Plan of Care (Signed)
Problem: RH Stairs Goal: LTG Patient will ambulate up and down stairs w/assist (PT) LTG: Patient will ambulate up and down # of stairs with assistance (PT)  Outcome: Not Met (add Reason) Pt requires hands on assist to boost up curb step safely

## 2015-02-21 NOTE — Progress Notes (Signed)
[place] PHYSICAL MEDICINE & REHABILITATION     PROGRESS NOTE    Subjective/Complaints: Feeling well. Using ice for hip. Pt denies nausea, vomiting, abdominal pain, diarrhea, chest pain, shortness of breath, palpitations, dizziness Review of systems positive for chronic right-sided weakness Objective: Vital Signs: Blood pressure 119/69, pulse 73, temperature 97.7 F (36.5 C), temperature source Oral, resp. rate 18, height 5\' 4"  (1.626 m), SpO2 99 %. No results found.  Recent Labs  02/20/15 1055  WBC 9.5  HGB 10.3*  HCT 33.2*  PLT 139*   No results for input(s): NA, K, CL, GLUCOSE, BUN, CREATININE, CALCIUM in the last 72 hours.  Invalid input(s): CO CBG (last 3)  No results for input(s): GLUCAP in the last 72 hours.  Wt Readings from Last 3 Encounters:  02/13/15 61.2 kg (134 lb 14.7 oz)  12/29/14 54.432 kg (120 lb)  04/26/09 48.716 kg (107 lb 6.4 oz)    Physical Exam:  Gen: appears fatigued. Slower to respond today HENT: oral mucosa pink and moist Head: Normocephalic.  Eyes: EOM are normal.  Neck: Normal range of motion. Neck supple. No thyromegaly present.  Cardiovascular: Normal rate and regular rhythm.  Respiratory: Effort normal and breath sounds generally normal---occasional coarse breath sounds. No respiratory distress.  GI: Soft. Bowel sounds are normal. He exhibits no distension.  Musculoskeletal:  Right hip sutures intact. Wound nicely approximated. Mild bruising near incision. Adhesive capsulitis right shoulder--30 degrees abd Neurological: He is alert.  Patient can provide his name and age. Appropriate for yes and no. Follows simple commands. RUE 2 to 2+/5. RLE 1-2/5. LUE 4+/5. Alert and generally appropriate.  Tone:  MAS 2-3 throughout the RUE. RLE MAS 1-2 Skin:  Right hip wound remains intact clean and dry. No erythema/warmth near incision Psychiatric: He has a normal mood and affect. His behavior is normal. Cooperative. Fair insight and  awareness. Language barrier  Assessment/Plan: 1. Functional deficits secondary to right femoral neck fracture which require 3+ hours per day of interdisciplinary therapy in a comprehensive inpatient rehab setting. Physiatrist is providing close team supervision and 24 hour management of active medical problems listed below. Physiatrist and rehab team continue to assess barriers to discharge/monitor patient progress toward functional and medical goals. FIM: FIM - Bathing Bathing Steps Patient Completed: Chest, Right Arm, Right lower leg (including foot), Left upper leg, Left lower leg (including foot), Left Arm, Abdomen, Front perineal area, Right upper leg, Buttocks Bathing: 5: Supervision: Safety issues/verbal cues  FIM - Upper Body Dressing/Undressing Upper body dressing/undressing steps patient completed: Thread/unthread left sleeve of front closure shirt/dress, Button/unbutton shirt, Pull shirt around back of front closure shirt/dress, Thread/unthread right sleeve of front closure shirt/dress Upper body dressing/undressing: 5: Set-up assist to: Obtain clothing/put away FIM - Lower Body Dressing/Undressing Lower body dressing/undressing steps patient completed: Don/Doff left sock, Don/Doff right sock, Pull pants up/down, Thread/unthread right pants leg, Thread/unthread left pants leg, Don/Doff left shoe, Don/Doff right shoe Lower body dressing/undressing: 5: Supervision: Safety issues/verbal cues  FIM - Toileting Toileting steps completed by patient: Adjust clothing prior to toileting, Adjust clothing after toileting Toileting Assistive Devices: Grab bar or rail for support Toileting: 3: Mod-Patient completed 2 of 3 steps  FIM - Diplomatic Services operational officerToilet Transfers Toilet Transfers Assistive Devices: Grab bars Toilet Transfers: 4-To toilet/BSC: Min A (steadying Pt. > 75%)  FIM - BankerBed/Chair Transfer Bed/Chair Transfer Assistive Devices: Teacher, musicCane Bed/Chair Transfer: 6: Supine > Sit: No assist, 6: Sit > Supine:  No assist, 5: Bed > Chair or W/C: Supervision (verbal  cues/safety issues), 5: Chair or W/C > Bed: Supervision (verbal cues/safety issues)  FIM - Locomotion: Wheelchair Distance: 150 Locomotion: Wheelchair: 5: Travels 150 ft or more: maneuvers on rugs and over door sills with supervision, cueing or coaxing FIM - Locomotion: Ambulation Locomotion: Ambulation Assistive Devices: Occupational hygienist Ambulation/Gait Assistance: 5: Supervision, 4: Min guard Locomotion: Ambulation: 2: Travels 50 - 149 ft with minimal assistance (Pt.>75%)  Comprehension Comprehension Mode: Auditory Comprehension: 4-Understands basic 75 - 89% of the time/requires cueing 10 - 24% of the time  Expression Expression Mode: Verbal Expression: 4-Expresses basic 75 - 89% of the time/requires cueing 10 - 24% of the time. Needs helper to occlude trach/needs to repeat words.  Social Interaction Social Interaction: 5-Interacts appropriately 90% of the time - Needs monitoring or encouragement for participation or interaction.  Problem Solving Problem Solving: 5-Solves basic problems: With no assist  Memory Memory: 5-Recognizes or recalls 90% of the time/requires cueing < 10% of the time  Medical Problem List and Plan: 1. Functional deficits secondary to right femoral neck fracture. Status post cannulated hip pinning 02/10/2015. Weightbearing as tolerated  2. DVT Prophylaxis/Anticoagulation: SCDs.   3. Pain Management: tylenol, ice 4. Acute blood loss anemia. Dietary sources 5. Neuropsych: This patient is capable of making decisions on his own behalf. 6. Skin/Wound Care: Routine skin checks 7. Fluids/Electrolytes/Nutrition: encourage po, regular labs 8. History of CVA 2 with right hemiparesis. Continue aspirin 9. History of Hypertension. Patient on Toprol 50 mg daily prior to admission. Resume as tolerated 10. BPH. Avodart/Flomax.  --at baseline 11.HCAP. Levaquin 3 days, afebrile currently 12. Spastic right hemiparesis,  adhesive capsulitis right shoulder  - aggressive ROM. Unable to tolerate baclofen  - botox as an outpt 13. AMS/resolved off baclofen  LOS (Days) 8 A FACE TO FACE EVALUATION WAS PERFORMED  SWARTZ,ZACHARY T 02/21/2015 8:21 AM

## 2015-02-21 NOTE — Discharge Summary (Signed)
[name]:  [name], [name]              ACCOUNT NO.:  [id number]  MEDICAL RECORD NO.:  [id number]  LOCATION:  [id number]                        FACILITY:  [facility]  PHYSICIAN:  Ranelle OysterZachary T. Swartz, M.D.DATE OF BIRTH:  10-04-1934  DATE OF ADMISSION:  02/13/2015 DATE OF DISCHARGE:  02/22/2015                              DISCHARGE SUMMARY   DISCHARGE DIAGNOSES: 1. Functional deficits secondary to right femoral neck fracture status     post cannulated hip pinning, February 10, 2015. 2. SCDs for deep vein thrombosis prophylaxis. 3. Pain management. 4. Acute blood loss anemia. 5. History of cerebrovascular accident x2 with right hemiparesis,     maintained on aspirin. 6. Hypertension. 7. BPH. 8. Healthcare-associated pneumonia.  HISTORY OF PRESENT ILLNESS:  This is a 81 year old right-handed, limited English-speaking male with history of CVA x2 with spastic right hemiparesis, and [facility] who lives with his wife. Primarily used a wheelchair prior to admission.  He could ambulate short distances with a cane and walker.  Presented on February 09, 2015, after recent fall from the toilet, landing on his right hip.  X-rays and imaging revealed right hip fracture.  Underwent cannulated hip pinning, February 10, 2015, per Dr. Eulah PontMurphy, weightbearing as tolerated.  Hospital course, pain management.  Continued on aspirin as prior to admission. Acute blood loss anemia, 8.8, and monitored.  Postoperative chest x-ray showed suspicion right side pneumonia, maintained on Primaxin.  Blood cultures negative.  Changed to Levaquin, February 13, 2015.  Speech Therapy followup.  Currently, on a dysphagia #2 thin liquid diet.  Physical and occupational therapy ongoing.  The patient was admitted for comprehensive rehab program.  PAST MEDICAL HISTORY:  See discharge diagnoses.  SOCIAL HISTORY:  Lives with spouse.  Used a cane, walker prior to admission as well as wheelchair.  [facility], +2 physical assist sit to stand, +2 safety stand pivot transfers, +2 physical assist, ambulate 4 feet with a rolling walker, mod-to-max assist activities of daily living.  PHYSICAL EXAMINATION:  VITAL SIGNS:  Blood pressure 128/60, pulse 93, temperature 98, respirations 18. GENERAL:  This was an alert male, in no acute distress.  He was able to provide name and age, appropriate for yes, no, and followed commands. LUNGS:  Decreased breath sounds.  Clear to auscultation. CARDIAC:  Regular rate and rhythm. ABDOMEN:  Soft, nontender.  Good bowel sounds. PELVIC:  Right hip was dressed, appropriately tender. EXTREMITIES:  He did show some adhesive capsulitis, right shoulder, 30 degrees abduction.  REHABILITATION HOSPITAL COURSE:  The patient was [facility] with therapies initiated on a 3-hour daily basis, consisting of physical therapy, occupational therapy, and rehabilitation nursing.  The following issues were addressed during the patient's rehabilitation stay.  Pertaining to [name] [name]'s functional deficits secondary to right femoral neck fracture, remained stable.  Surgical site healing nicely, appropriately tender, status post cannulated hip pinning, February 10, 2015, weightbearing as tolerated.  He would follow up with [facility]opedics Services.  Sutures have been removed without issue. Sequential compression devices for DVT prophylaxis.  Venous Doppler studies were negative.  Pain management with the use of Tylenol at family's request.  The patient  tolerated well.  He did have a history of CVA x2, right hemiparesis.  He continued on aspirin, noted spastic right hemiparesis with adhesive capsulitis, right shoulder, aggressive range of motion as advised.  Attempted at baclofen which the patient was not able to tolerate, thus was discontinued.  There was some discussion of Botox as an outpatient.  He completed a course of Levaquin for HCAP. Oxygen  saturations good.  No shortness of breath.  He had a history of hypertension.  He had been on Toprol prior to admission.  This would be resumed as needed.  BPH, voiding without difficulty.  He remained on Avodart as well as Flomax.  The patient received weekly collaborative interdisciplinary team conferences to discuss estimated length of stay, family teaching, and any barriers to discharge.  Sessions focused on functional transfers, ambulation with a wide base quad cane.  Neuro re- education for balance and functional use of right upper extremity, wheelchair mobility with supervision for functional strengthening and endurance and curve step negotiation with wide base quad cane minimal assist, needing some rest breaks for fatigue.  The patient's ambulation was steady assist close supervision with cues for increased step length, ambulating 60 feet.  Family remained on hand for family teaching throughout his rehab course.  The patient could propel as wheelchair to the therapy gym, and engage in dynamic standing tasks.  Still needing some assist for lower body dressing.  The patient's plan was discharged home with ongoing therapies dictated per [facility].  DISCHARGE MEDICATIONS: 1. Aspirin 325 mg p.o. daily. 2. Colace 100 mg p.o. b.i.d. 3. Avodart 0.5 mg p.o. daily. 4. Flomax 0.4 mg p.o. daily.  DIET:  Mechanical soft.  Weightbearing as tolerated.  The patient to follow up with Dr. Eulah Pont as advised.  Primary care provider, medical management.     Mariam Dollar, P.A.   ______________________________ Ranelle Oyster, M.D.    DA/MEDQ  D:  02/21/2015  T:  02/21/2015  Job:  [id number]  cc:   Dr. Daune Perch, M.D.

## 2015-02-21 NOTE — Progress Notes (Signed)
Occupational Therapy Discharge Summary  Patient Details  Name: [name] MRN: [id number] Date of Birth: 07/11/1934  Patient has met 8 of 8 long term goals due to improved activity tolerance, improved balance, postural control, ability to compensate for deficits and improved coordination.  Pt made steady progress with BADLs during this admission.  Pt performs all BADLs (bathing, dressing, tub bench transfers, toilet transfers, and toileting) at supervision level.  Pt's wife has been present for therapy sessions and provides appropriate level of supervision. Pt requires extra time to complete all tasks with multiple rest breaks. Patient to discharge at overall Supervision level.  Patient's care partner is independent to provide the necessary physical assistance at discharge.      Recommendation:  Patient will benefit from ongoing skilled OT services in home health setting to continue to advance functional skills in the area of BADL.  Equipment: No equipment provided Pt owns BSC and tub bench  Reasons for discharge: treatment goals met and discharge from hospital  Patient/family agrees with progress made and goals achieved: Yes  OT Discharge Vision/Perception  Vision- History Baseline Vision/History: Wears glasses Wears Glasses: Reading only Patient Visual Report: No change from baseline Vision- Assessment Vision Assessment?: Yes Eye Alignment: Within Functional Limits Ocular Range of Motion: Within Functional Limits Alignment/Gaze Preference: Within Defined Limits Tracking/Visual Pursuits: Able to track stimulus in all quads without difficulty;Requires cues, head turns, or add eye shifts to track Saccades: Within functional limits Convergence: Within functional limits Visual Fields: No apparent deficits  Cognition Overall Cognitive Status: Within Functional Limits for tasks assessed Arousal/Alertness: Awake/alert Orientation Level: Oriented X4 Attention:  Selective;Sustained Sustained Attention: Appears intact Selective Attention: Appears intact Memory: Appears intact Awareness: Appears intact Problem Solving: Appears intact Safety/Judgment: Appears intact Sensation Sensation Light Touch: Impaired Detail Light Touch Impaired Details: Impaired RUE;Impaired RLE Additional Comments: decreased sensation throughout R side at baseline due to hx of CVA Coordination Gross Motor Movements are Fluid and Coordinated: No Fine Motor Movements are Fluid and Coordinated: No Motor  Motor Motor: Abnormal tone;Abnormal postural alignment and control;Hemiplegia Motor - Skilled Clinical Observations: baseline R spastic hemiplegia, forward flexed posture Motor - Discharge Observations: baseline R spastic hemiplegia, forward flexed posture Mobility  Bed Mobility Bed Mobility: Sit to Supine;Supine to Sit Supine to Sit: 6: Modified independent (Device/Increase time)  Trunk/Postural Assessment  Cervical Assessment Cervical Assessment: Exceptions to [facility] (forward head) Thoracic Assessment Thoracic Assessment: Exceptions to [facility] (kyphosis) Lumbar Assessment Lumbar Assessment: Exceptions to [facility] (decreased lordosis and posterior pelvic tilt) Postural Control Protective Responses: impaired/delayed  Extremity/Trunk Assessment RUE Assessment RUE Assessment: Exceptions to [facility] RUE AROM (degrees) RUE Overall AROM Comments: hx of hemiplegia PTA (2 previous CVAs); adhesive capsulitis noted PTA RUE Strength RUE Overall Strength Comments: 2+/5 overall due to hx of hemiplegia (2 previous CVAs) LUE Assessment LUE Assessment: Within Functional Limits  See FIM for current functional status  Leroy Libman 02/21/2015, 2:47 PM

## 2015-02-21 NOTE — Progress Notes (Signed)
Occupational Therapy Note  Patient Details  Name: [name] LagosJimmy L [name] MRN: [id number] Date of Birth: 07/01/1934  Today's Date: 02/21/2015 OT Individual Time: [id number] OT Individual Time Calculation (min): 57 min   Pt denied pain Individual Therapy  Pt initially engaged in practicing tub bench transfers to include amb with [facility]' to access bathroom and sitting on tub bench.  Pt practiced X 3 at supervision level.  Pt transitioned to therapy gym and engaged in dynamic standing activities including reaching across midline with LUE to retrieve clothes pins and place them on metal dowel.  Pt returned to room and transferred to recliner (supervision).  Continued family education and discharge planning.  Pt and wife pleased with progress and patient stated he was ready to go home.   Lavone NeriLanier, Jeury Mcnab [facility] Surgery[facility]appell 02/21/2015, 2:28 PM

## 2015-02-21 NOTE — Progress Notes (Signed)
Physical Therapy Discharge Summary  Patient Details  Name: [name] MRN: [id number] Date of Birth: 1934-01-25  Today's Date: 02/21/2015 PT Individual Time: 0900-1000 PT Individual Time Calculation (min): 60 min    Patient has met 6 of 7 long term goals due to improved activity tolerance, decreased pain and ability to compensate for deficits.  Patient to discharge at a wheelchair level Supervision with short distance ambulation in home environment.  Patient's care partner is independent to provide the necessary supervision assistance at discharge.  Reasons goals not met: Pt requires hands on assist to negotiate curb step in/out of home  Recommendation:  Patient will benefit from ongoing skilled PT services in home health setting to continue to advance safe functional mobility, address ongoing impairments in balance, strength, gait, and minimize fall risk.  Equipment: No equipment provided  Reasons for discharge: treatment goals met and discharge from hospital  Patient/family agrees with progress made and goals achieved: Yes   Treatment Summary: Pt seen for PT re-evaluation and discharge. Session focused on functional re-evaluation, general strengthening, bed mobility. Pt mod (I) for bed mobility and overall supervision for transfers and short distance ambulation. Pt requires MinA for longer distance ambulation and curb step negotiation. Pt completed 10' on Nustep with BUE/BLE with assist to maintain RUE/RLe on machine. Pt satisfied with progress. Session ended in pt's room, where pt was left seated in w/c w/ all needs within reach.   PT Discharge Precautions/Restrictions Precautions Precautions: Fall Precaution Comments: hx Right hemiparesis Restrictions Weight Bearing Restrictions: Yes RLE Weight Bearing: Weight bearing as tolerated Vital Signs Therapy Vitals Temp: 97.7 F (36.5 C) Temp Source: Oral Pulse Rate: 73 Resp: 18 BP: 119/69 mmHg Patient Position (if  appropriate): Sitting Oxygen Therapy SpO2: 99 % O2 Device: Not Delivered Pain Pain Assessment Pain Assessment: 0-10 Pain Score: 4  Pain Type: Surgical pain Pain Location: Hip Pain Orientation: Right;Upper Pain Descriptors / Indicators: Aching Pain Onset: With Activity Vision/Perception     Cognition Overall Cognitive Status: Within Functional Limits for tasks assessed Arousal/Alertness: Awake/alert Orientation Level: Oriented X4 Sensation Sensation Light Touch Impaired Details: Impaired RUE;Impaired RLE Additional Comments: decreased sensation throughout R side at baseline due to hx of CVA Coordination Gross Motor Movements are Fluid and Coordinated: No Fine Motor Movements are Fluid and Coordinated: No Coordination and Movement Description: hx of R spastic hemiplegia at baseline, increased tone in RLE Motor  Motor Motor: Abnormal tone;Abnormal postural alignment and control;Hemiplegia Motor - Discharge Observations: baseline R spastic hemiplegia, forward flexed posture  Mobility Bed Mobility Bed Mobility: Sit to Supine;Supine to Sit Supine to Sit: 6: Modified independent (Device/Increase time) Sit to Supine: 6: Modified independent (Device/Increase time) Transfers Transfers: Yes Sit to Stand: 5: Supervision Stand to Sit: 5: Supervision Stand Pivot Transfers: 5: Supervision Locomotion  Ambulation Ambulation: Yes Ambulation/Gait Assistance: 5: Supervision Assistive device: Large base quad cane Gait Gait: Yes Gait Pattern: Impaired Gait Pattern: Antalgic;Right foot flat;Shuffle;Trunk flexed Gait velocity: significantly decreased Stairs / Additional Locomotion Stairs: Yes Stairs Assistance: 4: Min assist Stairs Assistance Details (indicate cue type and reason): MinA to boost up curb step Number of Stairs: 1 Curb: 4: Min Chemical engineer: Yes Wheelchair Assistance: 6: Modified independent (Device/Increase time) Wheelchair  Propulsion: Left upper extremity;Left lower extremity Wheelchair Parts Management: Needs assistance Distance: 150  Trunk/Postural Assessment  Cervical Assessment Cervical Assessment: Exceptions to [facility] (forward head) Thoracic Assessment Thoracic Assessment: Exceptions to [facility] (increased kyphosis) Lumbar Assessment Lumbar Assessment: Exceptions to [facility] (decreased lordosis and posterior  pelvic tilt)  Balance Balance Balance Assessed: Yes Dynamic Standing Balance Dynamic Standing - Balance Support: Left upper extremity supported;During functional activity Dynamic Standing - Level of Assistance: 5: Stand by assistance Dynamic Standing - Balance Activities: Lateral lean/weight shifting;Forward lean/weight shifting Extremity Assessment      RLE Assessment RLE Assessment: Exceptions to [facility] RLE Strength RLE Overall Strength: Due to premorbid status;Deficits RLE Overall Strength Comments: grossly 4/5 throughout LLE Assessment LLE Assessment: Within Functional Limits  See FIM for current functional status  Rada Hay 02/21/2015, 9:30 AM

## 2015-02-21 NOTE — Progress Notes (Signed)
Physical Therapy Session Note  Patient Details  Name: [name] LagosJimmy L Mayhall MRN: [id number] Date of Birth: 06/03/1934  Today's Date: 02/21/2015 PT Individual Time: 1500-1530 PT Individual Time Calculation (min): 30 min   Short Term Goals: Week 1:  PT Short Term Goal 1 (Week 1):  LTGs of overall supervision  Skilled Therapeutic Interventions/Progress Updates:    Gait Training: PT instructs pt in ambulation with quad cane x 50' req close SBA for safety.   Therapeutic Activity: PT instructs pt in recliner to w/c transfer without AD req mod A for balance - PT educates pt that he is to always use [facility] whenever standing for safety - no more transfers without an AD.  PT instructs pt in w/c to/from bed transfer with QC req SBA and in sit to/from supine transfer req SBA.  PT instructs pt in prolonged standing activity at sink with quad cane (washiang hands) req close SBA for safety.   PT educates pt and wife that wife should be close to pt when he is in standing at home until he gets used to his normal routine and until his pain levels are significantly lower. They verbalize understanding.   Pt is safe to d/c home with wife assist.   Therapy Documentation Precautions:  Precautions Precautions: Fall Precaution Comments: hx Right hemiparesis Restrictions Weight Bearing Restrictions: Yes RLE Weight Bearing: Weight bearing as tolerated Pain: Pain Assessment Pain Assessment: 0-10 Pain Score: 10-Worst pain ever Pain Type: Surgical pain Pain Location: Hip Pain Orientation: Right Pain Descriptors / Indicators: Aching Pain Onset: On-going Pain Intervention(s): Other (Comment) (walking reduces pt's pain to 6-7/10) Multiple Pain Sites: No  See FIM for current functional status  Therapy/Group: Individual Therapy  Masyn Rostro M 02/21/2015, 12:39 PM

## 2015-02-22 MED ORDER — FLUOCINONIDE-E 0.05 % EX CREA: TOPICAL_CREAM | Freq: Three times a day (TID) | CUTANEOUS | Status: DC

## 2015-02-22 MED ORDER — OXYCODONE HCL 5 MG PO TABS: 5.0000 mg | ORAL_TABLET | ORAL | Status: DC | PRN

## 2015-02-22 MED ORDER — DUTASTERIDE-TAMSULOSIN HCL 0.5-0.4 MG PO CAPS: 1.0000 | ORAL_CAPSULE | Freq: Every day | ORAL | Status: AC

## 2015-02-22 NOTE — Patient Care Conference (Addendum)
Inpatient RehabilitationTeam Conference and Plan of Care Update Date: 02/20/2015   Time: 2:55 PM    Patient Name: [name]      Medical Record Number: [id number]  Date of Birth: 1934-04-30 Sex: Male         Room/Bed: 4W10C/4W10C-01 Payor Info: Payor: MEDICARE / Plan: MEDICARE PART A AND B / Product Type: *No Product type* /    Admitting Diagnosis: R FEMOARAL NECK FX  Admit Date/Time:  02/13/2015  5:13 PM Admission Comments: No comment available   Primary Diagnosis:  Fracture of femoral neck, right Principal Problem: Fracture of femoral neck, right  Patient Active Problem List   Diagnosis Date Noted  . Femoral neck fracture 02/13/2015  . Acute respiratory failure with hypoxia 02/11/2015  . HCAP (healthcare-associated pneumonia) 02/11/2015  . Sepsis 02/11/2015  . Microcytic anemia 02/10/2015  . Thrombocytopenia 02/10/2015  . Cerebral infarction due to other mechanism   . Inguinal hernia 02/09/2015  . Fracture of femoral neck, right 02/09/2015  . Closed right hip fracture 02/09/2015  . Fractured hip 02/09/2015  . Tuberculosis of lung, infiltrative 04/26/2009  . Essential hypertension 04/26/2009  . Right spastic hemiparesis 04/26/2009  . OVERACTIVE BLADDER 04/26/2009  . Anorexia 04/26/2009    Expected Discharge Date: Expected Discharge Date: 02/22/15  Team Members Present: Physician leading conference: Dr. Faith Rogue Social Worker Present: [name], LCSW Nurse Present: Carlean Purl, RN PT Present: Bayard Hugger, PT OT Present: Ardis Rowan, Darolyn Rua, OT SLP Present: Feliberto Gottron, SLP Other (Discipline and Name): Ottie Glazier, RN [facility]) PPS Coordinator present : Tora Duck, RN, CRRN     Current Status/Progress Goal Weekly Team Focus  Medical   right femoral neck fracture, hx of right spastic hemiparesis  improve activity tolerance,  pain control, spasticity mgt   Bowel/Bladder   Pt continent of bowel and bladder         Swallow/Nutrition/  Hydration             ADL's   bathing-steady A; dressing-steady A; functional transfers-min A  supervision overall  transfers, BADLs, family education; family education   Mobility   min A overall  S overall  gait training, transfers, fam ed, strengthening, stairs/threshold for home entry, endurance, education   Communication             Safety/Cognition/ Behavioral Observations            Pain   pain at times to right hip. Tylenol PRN  3 or less  assess pain and medicate as needed   Skin   Right hip incision OTA- bruising surrounding site and hip. Inner right thigh rash present with lidex cream applied as ordered  remain free of skin breakdown and free from infection  assess skin q shift, monitor s/s of infection    Rehab Goals Patient on target to meet rehab goals: Yes *See Care Plan and progress notes for long and short-term goals.  Barriers to Discharge: premorbid deficits    Possible Resolutions to Barriers:  family ed, adaptive equipment    Discharge Planning/Teaching Needs:  home with wife and son to assist;  wife provides 24/7 care even PTA due to h/o CVA      Team Discussion:  Reaching baseline functioning.  Supervision goals for mobility as he utilizes power w/c at home.  Ready for d/c  Revisions to Treatment Plan:  None   Continued Need for Acute Rehabilitation Level of Care: The patient requires daily medical management by a physician with  specialized training in physical medicine and rehabilitation for the following conditions: Daily direction of a multidisciplinary physical rehabilitation program to ensure safe treatment while eliciting the highest outcome that is of practical value to the patient.: Yes Daily medical management of patient stability for increased activity during participation in an intensive rehabilitation regime.: Yes Daily analysis of laboratory values and/or radiology reports with any subsequent need for medication adjustment of medical  intervention for : Post surgical problems;Neurological problems  Bjorn Hallas 02/22/2015, 7:20 AM

## 2015-02-22 NOTE — Progress Notes (Signed)
[place] PHYSICAL MEDICINE & REHABILITATION     PROGRESS NOTE    Subjective/Complaints: No new issues Review of systems positive for chronic right-sided weakness Objective: Vital Signs: Blood pressure 123/70, pulse 60, temperature 97.5 F (36.4 C), temperature source Oral, resp. rate 18, height $RemoveBe'5\' 4"'[id number]$  (1.626 m), SpO2 100 %. No results found.  Recent Labs  02/20/15 1055  WBC 9.5  HGB 10.3*  HCT 33.2*  PLT 139*   No results for input(s): NA, K, CL, GLUCOSE, BUN, CREATININE, CALCIUM in the last 72 hours.  Invalid input(s): CO CBG (last 3)  No results for input(s): GLUCAP in the last 72 hours.  Wt Readings from Last 3 Encounters:  02/13/15 61.2 kg (134 lb 14.7 oz)  12/29/14 54.432 kg (120 lb)  04/26/09 48.716 kg (107 lb 6.4 oz)    Physical Exam:  Gen: appears fatigued. Slower to respond today HENT: oral mucosa pink and moist Head: Normocephalic.  Eyes: EOM are normal.  Neck: Normal range of motion. Neck supple. No thyromegaly present.  Cardiovascular: Normal rate and regular rhythm.  Respiratory: Effort normal and breath sounds generally normal---occasional coarse breath sounds. No respiratory distress.  GI: Soft. Bowel sounds are normal. He exhibits no distension.  Musculoskeletal:  Right hip sutures intact. Wound nicely approximated. Mild bruising near incision. Adhesive capsulitis right shoulder--30 degrees abd Neurological: He is alert.  Patient can provide his name and age. Appropriate for yes and no. Follows simple commands. RUE 2 to 2+/5. RLE 1-2/5. LUE 4+/5. Alert and generally appropriate.  Tone:  MAS 2-3 throughout the RUE. RLE MAS 1-2 Skin:  Right hip wound remains intact clean and dry. No erythema/warmth near incision Psychiatric: He has a normal mood and affect. His behavior is normal. Cooperative. Fair insight and awareness. Language barrier  Assessment/Plan: 1. Functional deficits secondary to right femoral neck fracture which require 3+ hours  per day of interdisciplinary therapy in a comprehensive inpatient rehab setting. Physiatrist is providing close team supervision and 24 hour management of active medical problems listed below. Physiatrist and rehab team continue to assess barriers to discharge/monitor patient progress toward functional and medical goals.  Dc home today. Follow up with me in about a month. Home health services arranged.  Goals met  FIM: FIM - Bathing Bathing Steps Patient Completed: Chest, Right Arm, Right lower leg (including foot), Left upper leg, Left lower leg (including foot), Left Arm, Abdomen, Front perineal area, Right upper leg, Buttocks Bathing: 5: Supervision: Safety issues/verbal cues  FIM - Upper Body Dressing/Undressing Upper body dressing/undressing steps patient completed: Thread/unthread left sleeve of front closure shirt/dress, Button/unbutton shirt, Pull shirt around back of front closure shirt/dress, Thread/unthread right sleeve of front closure shirt/dress Upper body dressing/undressing: 5: Set-up assist to: Obtain clothing/put away FIM - Lower Body Dressing/Undressing Lower body dressing/undressing steps patient completed: Don/Doff left sock, Don/Doff right sock, Pull pants up/down, Thread/unthread right pants leg, Thread/unthread left pants leg, Don/Doff left shoe, Don/Doff right shoe Lower body dressing/undressing: 5: Supervision: Safety issues/verbal cues  FIM - Toileting Toileting steps completed by patient: Adjust clothing prior to toileting, Adjust clothing after toileting, Performs perineal hygiene Toileting Assistive Devices: Grab bar or rail for support Toileting: 5: Supervision: Safety issues/verbal cues  FIM - Radio producer Devices: Elevated toilet seat, Radio producer, Grab bars Toilet Transfers: 5-To toilet/BSC: Supervision (verbal cues/safety issues), 5-From toilet/BSC: Supervision (verbal cues/safety issues)  FIM - Ship broker Devices: Best boy: 6: Sit > Supine: No assist, 6: Supine >  Sit: No assist, 5: Bed > Chair or W/C: Supervision (verbal cues/safety issues), 5: Chair or W/C > Bed: Supervision (verbal cues/safety issues)  FIM - Locomotion: Wheelchair Distance: 150 Locomotion: Wheelchair: 5: Travels 150 ft or more: maneuvers on rugs and over door sills with supervision, cueing or coaxing FIM - Locomotion: Ambulation Locomotion: Ambulation Assistive Devices: Nurse, adult Ambulation/Gait Assistance: 5: Supervision Locomotion: Ambulation: 1: Travels less than 50 ft with supervision/safety issues  Comprehension Comprehension Mode: Auditory Comprehension: 4-Understands basic 75 - 89% of the time/requires cueing 10 - 24% of the time  Expression Expression Mode: Verbal Expression: 4-Expresses basic 75 - 89% of the time/requires cueing 10 - 24% of the time. Needs helper to occlude trach/needs to repeat words.  Social Interaction Social Interaction: 6-Interacts appropriately with others with medication or extra time (anti-anxiety, antidepressant).  Problem Solving Problem Solving: 5-Solves basic 90% of the time/requires cueing < 10% of the time  Memory Memory: 5-Recognizes or recalls 90% of the time/requires cueing < 10% of the time  Medical Problem List and Plan: 1. Functional deficits secondary to right femoral neck fracture. Status post cannulated hip pinning 02/10/2015. Weightbearing as tolerated  2. DVT Prophylaxis/Anticoagulation: SCDs.   3. Pain Management: tylenol, ice 4. Acute blood loss anemia. Dietary sources 5. Neuropsych: This patient is capable of making decisions on his own behalf. 6. Skin/Wound Care: Routine skin checks 7. Fluids/Electrolytes/Nutrition: encourage po, regular labs 8. History of CVA 2 with right hemiparesis. Continue aspirin 9. History of Hypertension. Patient on Toprol 50 mg daily prior to admission. Resume as tolerated 10. BPH.  Avodart/Flomax.  --at baseline 11.HCAP. Levaquin 3 days, afebrile currently 12. Spastic right hemiparesis, adhesive capsulitis right shoulder  - aggressive ROM. Unable to tolerate baclofen  - botox as an outpt 13. AMS/resolved off baclofen  LOS (Days) 9 A FACE TO FACE EVALUATION WAS PERFORMED  SWARTZ,ZACHARY T 02/22/2015 8:10 AM

## 2015-02-22 NOTE — Progress Notes (Signed)
Social Work Patient ID: [name] LagosJimmy L [name], male   DOB: 09/15/1934, 81 y.o.   MRN: [id number]  [name] PancoastLucy [name] [name] Hubner, LCSW Social Worker Addendum  Patient Care Conference 02/22/2015  7:20 AM    Expand All Collapse All   Inpatient RehabilitationTeam Conference and Plan of Care Update Date: 02/20/2015   Time: 2:55 PM     Patient Name: [name]       Medical Record Number: [id number]  Date of Birth: 03/28/1934 Sex: Male         Room/Bed: 4W10C/4W10C-01 Payor Info: Payor: MEDICARE / Plan: MEDICARE PART A AND B / Product Type: *No Product type* /    Admitting Diagnosis: [name] FEMOARAL NECK FX   Admit Date/Time:  02/13/2015  5:13 PM Admission Comments: No comment available   Primary Diagnosis:  Fracture of femoral neck, right Principal Problem: Fracture of femoral neck, right    Patient Active Problem List     Diagnosis  Date Noted   .  Femoral neck fracture  02/13/2015   .  Acute respiratory failure with hypoxia  02/11/2015   .  HCAP (healthcare-associated pneumonia)  02/11/2015   .  Sepsis  02/11/2015   .  Microcytic anemia  02/10/2015   .  Thrombocytopenia  02/10/2015   .  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  Inguinal hernia  02/09/2015   .  Fracture of femoral neck, right  02/09/2015   .  Closed right hip fracture  02/09/2015   .  Fractured hip  02/09/2015   .  Tuberculosis of lung, infiltrative  04/26/2009   .  Essential hypertension  04/26/2009   .  Right spastic hemiparesis  04/26/2009   .  OVERACTIVE BLADDER  04/26/2009   .  Anorexia  04/26/2009     Expected Discharge Date: Expected Discharge Date: 02/22/15  Team Members Present: Physician leading conference: Dr. Faith RogueZachary Swartz Social Worker Present: Amada JupiterLucy Ellenora Talton, LCSW Nurse Present: Carlean PurlMaryann Barbour, RN PT Present: Bayard Huggerebecca Varner, PT OT Present: Ardis Rowanom [name], Darolyn RuaOTA;Kayla Perkinson, OT SLP Present: Feliberto Gottronourtney Payne, SLP Other (Discipline and Name): Ottie GlazierBarbara Boyette, RN [facility] PPS Coordinator present : Tora DuckMarie Noel, RN, CRRN       Current Status/Progress  Goal  Weekly Team Focus   Medical     right femoral neck fracture, hx of right spastic hemiparesis  improve activity tolerance,  pain control, spasticity mgt   Bowel/Bladder     Pt continent of bowel and bladder         Swallow/Nutrition/ Hydration               ADL's     bathing-steady A; dressing-steady A; functional transfers-min A  supervision overall  transfers, BADLs, family education; family education    Mobility     min A overall  S overall  gait training, transfers, fam ed, strengthening, stairs/threshold for home entry, endurance, education   Communication               Safety/Cognition/ Behavioral Observations              Pain     pain at times to right hip. Tylenol PRN   3 or less  assess pain and medicate as needed    Skin     Right hip incision OTA- bruising surrounding site and hip. Inner right thigh rash present with lidex cream applied as ordered   remain free of skin breakdown and free from infection   assess skin  q shift, monitor s/s of infection     Rehab Goals Patient on target to meet rehab goals: Yes *See Care Plan and progress notes for long and short-term goals.    Barriers to Discharge:  premorbid deficits     Possible Resolutions to Barriers:   family ed, adaptive equipment     Discharge Planning/Teaching Needs:   home with wife and son to assist;  wife provides 24/7 care even PTA due to h/o CVA       Team Discussion:    Reaching baseline functioning.  Supervision goals for mobility as he utilizes power w/c at home.  Ready for d/c   Revisions to Treatment Plan:    None    Continued Need for Acute Rehabilitation Level of Care: The patient requires daily medical management by a physician with specialized training in physical medicine and rehabilitation for the following conditions: Daily direction of a multidisciplinary physical rehabilitation program to ensure safe treatment while eliciting the highest outcome  that is of practical value to the patient.: Yes Daily medical management of patient stability for increased activity during participation in an intensive rehabilitation regime.: Yes Daily analysis of laboratory values and/or radiology reports with any subsequent need for medication adjustment of medical intervention for : Post surgical problems;Neurological problems  Shadow Schedler 02/22/2015, 7:20 AM        Revision History      Date/Time User Provider Type Action    02/22/2015 10:07 AM [name] Pancoast, LCSW Social Worker Addend    02/22/2015 10:04 AM [name] Pancoast, LCSW Social Worker Sign    View Details Report

## 2015-02-22 NOTE — Discharge Instructions (Signed)
Inpatient Rehab Discharge Instructions  Earl LagosJimmy L Klann Discharge date and time: No discharge date for patient encounter.   Activities/Precautions/ Functional Status: Activity: activity as tolerated Diet: soft Wound Care: keep wound clean and dry Functional status:  ___ No restrictions     ___ Walk up steps independently __xSTROKE/TIA DISCHARGE INSTRUCTIONS SMOKING Cigarette smoking nearly doubles your risk of having a stroke & is the single most alterable risk factor  If you smoke or have smoked in the last 12 months, you are advised to quit smoking for your health.  Most of the excess cardiovascular risk related to smoking disappears within a year of stopping.  Ask you doctor about anti-smoking medications  [place] Quit Line: 1-800-QUIT NOW  Free Smoking Cessation Classes [phone]  CHOLESTEROL Know your levels; limit fat & cholesterol in your diet  Lipid Panel  No results found for: CHOL, TRIG, HDL, CHOLHDL, VLDL, LDLCALC    Many patients benefit from treatment even if their cholesterol is at goal.  Goal: Total Cholesterol (CHOL) less than 160  Goal:  Triglycerides (TRIG) less than 150  Goal:  HDL greater than 40  Goal:  LDL (LDLCALC) less than 100   BLOOD PRESSURE American Stroke Association blood pressure target is less that 120/80 mm/Hg  Your discharge blood pressure is:  BP: 119/69 mmHg  Monitor your blood pressure  Limit your salt and alcohol intake  Many individuals will require more than one medication for high blood pressure  DIABETES (A1c is a blood sugar average for last 3 months) Goal HGBA1c is under 7% (HBGA1c is blood sugar average for last 3 months)  Diabetes: No known diagnosis of diabetes    No results found for: HGBA1C   Your HGBA1c can be lowered with medications, healthy diet, and exercise.  Check your blood sugar as directed by your physician  Call your physician if you experience unexplained or low blood sugars.  PHYSICAL  ACTIVITY/REHABILITATION Goal is 30 minutes at least 4 days per week  Activity: Increase activity slowly, Therapies: Physical Therapy: Home Health Return to work:   Activity decreases your risk of heart attack and stroke and makes your heart stronger.  It helps control your weight and blood pressure; helps you relax and can improve your mood.  Participate in a regular exercise program.  Talk with your doctor about the best form of exercise for you (dancing, walking, swimming, cycling).  DIET/WEIGHT Goal is to maintain a healthy weight  Your discharge diet is: DIET DYS 3 Room service appropriate?: Yes; Fluid consistency:: Thin  liquids Your height is:  Height: 5\' 4"  (162.6 cm) Your current weight is:   Your Body Mass Index (BMI) is:     Following the type of diet specifically designed for you will help prevent another stroke.  Your goal weight range is:    Your goal Body Mass Index (BMI) is 19-24.  Healthy food habits can help reduce 3 risk factors for stroke:  High cholesterol, hypertension, and excess weight.  RESOURCES Stroke/Support Group:  Call [phone]   STROKE EDUCATION PROVIDED/REVIEWED AND GIVEN TO PATIENT Stroke warning signs and symptoms How to activate emergency medical system (call 911). Medications prescribed at discharge. Need for follow-up after discharge. Personal risk factors for stroke. Pneumonia vaccine given:  Flu vaccine given:  My questions have been answered, the writing is legible, and I understand these instructions.  I will adhere to these goals & educational materials that have been provided to me after my discharge from the hospital.  _ 24/7 supervision/assistance   ___ Walk up steps with assistance ___ Intermittent supervision/assistance  ___ Bathe/dress independently ___ Walk with walker     ___ Bathe/dress with assistance ___ Walk Independently    ___ Shower independently _x__ Walk with assistance    ___ Shower with assistance ___ No  alcohol     ___ Return to work/school ________     COMMUNITY REFERRALS UPON DISCHARGE:    Home Health:   PT                    Agency:  [facility] Phone: [phone]      Special Instructions:  HOLD TOPROL FOR NOW AND MONITOR PARAMETERS AND RESUME  My questions have been answered and I understand these instructions. I will adhere to these goals and the provided educational materials after my discharge from the hospital.  Patient/Caregiver Signature _______________________________ Date __________  Clinician Signature _______________________________________ Date __________  Please bring this form and your medication list with you to all your follow-up doctor's appointments.

## 2015-02-22 NOTE — Progress Notes (Signed)
Social Work  Discharge Note  The overall goal for the admission was met for:   Discharge location: Yes - home with wife who can provide 24/7 assistance  Length of Stay: Yes - 9 days  Discharge activity level: Yes - supervision overall  Home/community participation: Yes  Services provided included: MD, RD, PT, OT, RN, TR, Pharmacy and SW  Financial Services: Medicare and Private Insurance: [employer]  Follow-up services arranged: Home Health: PT via [facility] and Patient/Family has no preference for HH/DME agencies  Comments (or additional information):  Patient/Family verbalized understanding of follow-up arrangements: Yes  Individual responsible for coordination of the follow-up plan: pt/ wife  Confirmed correct DME delivered: already owned needed DME    [name]

## 2015-02-22 NOTE — Progress Notes (Signed)
Pt discharged to home with family at 0900. Discharge instructions given by Harvel Ricksan Anguilli, PA with verbal understanding. Belongings with pt and family

## 2015-02-26 DIAGNOSIS — D62 Acute posthemorrhagic anemia: Secondary | ICD-10-CM | POA: Diagnosis not present

## 2015-02-26 DIAGNOSIS — I69391 Dysphagia following cerebral infarction: Secondary | ICD-10-CM | POA: Diagnosis not present

## 2015-02-26 DIAGNOSIS — I69351 Hemiplegia and hemiparesis following cerebral infarction affecting right dominant side: Secondary | ICD-10-CM | POA: Diagnosis not present

## 2015-02-26 DIAGNOSIS — S72001D Fracture of unspecified part of neck of right femur, subsequent encounter for closed fracture with routine healing: Secondary | ICD-10-CM | POA: Diagnosis not present

## 2015-02-26 DIAGNOSIS — I10 Essential (primary) hypertension: Secondary | ICD-10-CM | POA: Diagnosis not present

## 2015-02-26 DIAGNOSIS — N400 Enlarged prostate without lower urinary tract symptoms: Secondary | ICD-10-CM | POA: Diagnosis not present

## 2015-02-26 DIAGNOSIS — J189 Pneumonia, unspecified organism: Secondary | ICD-10-CM | POA: Diagnosis not present

## 2015-02-28 DIAGNOSIS — I10 Essential (primary) hypertension: Secondary | ICD-10-CM | POA: Diagnosis not present

## 2015-02-28 DIAGNOSIS — D62 Acute posthemorrhagic anemia: Secondary | ICD-10-CM | POA: Diagnosis not present

## 2015-02-28 DIAGNOSIS — J189 Pneumonia, unspecified organism: Secondary | ICD-10-CM | POA: Diagnosis not present

## 2015-02-28 DIAGNOSIS — I69391 Dysphagia following cerebral infarction: Secondary | ICD-10-CM | POA: Diagnosis not present

## 2015-02-28 DIAGNOSIS — S72001D Fracture of unspecified part of neck of right femur, subsequent encounter for closed fracture with routine healing: Secondary | ICD-10-CM | POA: Diagnosis not present

## 2015-02-28 DIAGNOSIS — I69351 Hemiplegia and hemiparesis following cerebral infarction affecting right dominant side: Secondary | ICD-10-CM | POA: Diagnosis not present

## 2015-03-02 DIAGNOSIS — S72001D Fracture of unspecified part of neck of right femur, subsequent encounter for closed fracture with routine healing: Secondary | ICD-10-CM | POA: Diagnosis not present

## 2015-03-02 DIAGNOSIS — J189 Pneumonia, unspecified organism: Secondary | ICD-10-CM | POA: Diagnosis not present

## 2015-03-02 DIAGNOSIS — I10 Essential (primary) hypertension: Secondary | ICD-10-CM | POA: Diagnosis not present

## 2015-03-02 DIAGNOSIS — I69351 Hemiplegia and hemiparesis following cerebral infarction affecting right dominant side: Secondary | ICD-10-CM | POA: Diagnosis not present

## 2015-03-02 DIAGNOSIS — D62 Acute posthemorrhagic anemia: Secondary | ICD-10-CM | POA: Diagnosis not present

## 2015-03-02 DIAGNOSIS — I69391 Dysphagia following cerebral infarction: Secondary | ICD-10-CM | POA: Diagnosis not present

## 2015-03-05 DIAGNOSIS — D62 Acute posthemorrhagic anemia: Secondary | ICD-10-CM | POA: Diagnosis not present

## 2015-03-05 DIAGNOSIS — I10 Essential (primary) hypertension: Secondary | ICD-10-CM | POA: Diagnosis not present

## 2015-03-05 DIAGNOSIS — S72001D Fracture of unspecified part of neck of right femur, subsequent encounter for closed fracture with routine healing: Secondary | ICD-10-CM | POA: Diagnosis not present

## 2015-03-05 DIAGNOSIS — I69351 Hemiplegia and hemiparesis following cerebral infarction affecting right dominant side: Secondary | ICD-10-CM | POA: Diagnosis not present

## 2015-03-05 DIAGNOSIS — I69391 Dysphagia following cerebral infarction: Secondary | ICD-10-CM | POA: Diagnosis not present

## 2015-03-05 DIAGNOSIS — J189 Pneumonia, unspecified organism: Secondary | ICD-10-CM | POA: Diagnosis not present

## 2015-03-06 DIAGNOSIS — S72001D Fracture of unspecified part of neck of right femur, subsequent encounter for closed fracture with routine healing: Secondary | ICD-10-CM | POA: Diagnosis not present

## 2015-03-06 DIAGNOSIS — D62 Acute posthemorrhagic anemia: Secondary | ICD-10-CM

## 2015-03-06 DIAGNOSIS — I10 Essential (primary) hypertension: Secondary | ICD-10-CM

## 2015-03-06 DIAGNOSIS — I69351 Hemiplegia and hemiparesis following cerebral infarction affecting right dominant side: Secondary | ICD-10-CM | POA: Diagnosis not present

## 2015-03-06 DIAGNOSIS — I69391 Dysphagia following cerebral infarction: Secondary | ICD-10-CM | POA: Diagnosis not present

## 2015-03-06 DIAGNOSIS — N400 Enlarged prostate without lower urinary tract symptoms: Secondary | ICD-10-CM

## 2015-03-06 DIAGNOSIS — J189 Pneumonia, unspecified organism: Secondary | ICD-10-CM | POA: Diagnosis not present

## 2015-03-07 DIAGNOSIS — D62 Acute posthemorrhagic anemia: Secondary | ICD-10-CM | POA: Diagnosis not present

## 2015-03-07 DIAGNOSIS — I10 Essential (primary) hypertension: Secondary | ICD-10-CM | POA: Diagnosis not present

## 2015-03-07 DIAGNOSIS — I69351 Hemiplegia and hemiparesis following cerebral infarction affecting right dominant side: Secondary | ICD-10-CM | POA: Diagnosis not present

## 2015-03-07 DIAGNOSIS — S72001D Fracture of unspecified part of neck of right femur, subsequent encounter for closed fracture with routine healing: Secondary | ICD-10-CM | POA: Diagnosis not present

## 2015-03-07 DIAGNOSIS — J189 Pneumonia, unspecified organism: Secondary | ICD-10-CM | POA: Diagnosis not present

## 2015-03-07 DIAGNOSIS — I69391 Dysphagia following cerebral infarction: Secondary | ICD-10-CM | POA: Diagnosis not present

## 2015-03-09 DIAGNOSIS — J189 Pneumonia, unspecified organism: Secondary | ICD-10-CM | POA: Diagnosis not present

## 2015-03-09 DIAGNOSIS — I10 Essential (primary) hypertension: Secondary | ICD-10-CM | POA: Diagnosis not present

## 2015-03-09 DIAGNOSIS — S72001D Fracture of unspecified part of neck of right femur, subsequent encounter for closed fracture with routine healing: Secondary | ICD-10-CM | POA: Diagnosis not present

## 2015-03-09 DIAGNOSIS — D62 Acute posthemorrhagic anemia: Secondary | ICD-10-CM | POA: Diagnosis not present

## 2015-03-09 DIAGNOSIS — I69391 Dysphagia following cerebral infarction: Secondary | ICD-10-CM | POA: Diagnosis not present

## 2015-03-09 DIAGNOSIS — I69351 Hemiplegia and hemiparesis following cerebral infarction affecting right dominant side: Secondary | ICD-10-CM | POA: Diagnosis not present

## 2015-03-12 DIAGNOSIS — I69391 Dysphagia following cerebral infarction: Secondary | ICD-10-CM | POA: Diagnosis not present

## 2015-03-12 DIAGNOSIS — D62 Acute posthemorrhagic anemia: Secondary | ICD-10-CM | POA: Diagnosis not present

## 2015-03-12 DIAGNOSIS — S72001D Fracture of unspecified part of neck of right femur, subsequent encounter for closed fracture with routine healing: Secondary | ICD-10-CM | POA: Diagnosis not present

## 2015-03-12 DIAGNOSIS — I69351 Hemiplegia and hemiparesis following cerebral infarction affecting right dominant side: Secondary | ICD-10-CM | POA: Diagnosis not present

## 2015-03-12 DIAGNOSIS — I10 Essential (primary) hypertension: Secondary | ICD-10-CM | POA: Diagnosis not present

## 2015-03-12 DIAGNOSIS — J189 Pneumonia, unspecified organism: Secondary | ICD-10-CM | POA: Diagnosis not present

## 2015-03-14 DIAGNOSIS — D62 Acute posthemorrhagic anemia: Secondary | ICD-10-CM | POA: Diagnosis not present

## 2015-03-14 DIAGNOSIS — S72001D Fracture of unspecified part of neck of right femur, subsequent encounter for closed fracture with routine healing: Secondary | ICD-10-CM | POA: Diagnosis not present

## 2015-03-14 DIAGNOSIS — J189 Pneumonia, unspecified organism: Secondary | ICD-10-CM | POA: Diagnosis not present

## 2015-03-14 DIAGNOSIS — I69351 Hemiplegia and hemiparesis following cerebral infarction affecting right dominant side: Secondary | ICD-10-CM | POA: Diagnosis not present

## 2015-03-14 DIAGNOSIS — I69391 Dysphagia following cerebral infarction: Secondary | ICD-10-CM | POA: Diagnosis not present

## 2015-03-14 DIAGNOSIS — I10 Essential (primary) hypertension: Secondary | ICD-10-CM | POA: Diagnosis not present

## 2015-03-16 DIAGNOSIS — I69391 Dysphagia following cerebral infarction: Secondary | ICD-10-CM | POA: Diagnosis not present

## 2015-03-16 DIAGNOSIS — I69351 Hemiplegia and hemiparesis following cerebral infarction affecting right dominant side: Secondary | ICD-10-CM | POA: Diagnosis not present

## 2015-03-16 DIAGNOSIS — J189 Pneumonia, unspecified organism: Secondary | ICD-10-CM | POA: Diagnosis not present

## 2015-03-16 DIAGNOSIS — I10 Essential (primary) hypertension: Secondary | ICD-10-CM | POA: Diagnosis not present

## 2015-03-16 DIAGNOSIS — D62 Acute posthemorrhagic anemia: Secondary | ICD-10-CM | POA: Diagnosis not present

## 2015-03-16 DIAGNOSIS — S72001D Fracture of unspecified part of neck of right femur, subsequent encounter for closed fracture with routine healing: Secondary | ICD-10-CM | POA: Diagnosis not present

## 2015-03-19 DIAGNOSIS — J189 Pneumonia, unspecified organism: Secondary | ICD-10-CM | POA: Diagnosis not present

## 2015-03-19 DIAGNOSIS — I69351 Hemiplegia and hemiparesis following cerebral infarction affecting right dominant side: Secondary | ICD-10-CM | POA: Diagnosis not present

## 2015-03-19 DIAGNOSIS — I10 Essential (primary) hypertension: Secondary | ICD-10-CM | POA: Diagnosis not present

## 2015-03-19 DIAGNOSIS — S72001D Fracture of unspecified part of neck of right femur, subsequent encounter for closed fracture with routine healing: Secondary | ICD-10-CM | POA: Diagnosis not present

## 2015-03-19 DIAGNOSIS — D62 Acute posthemorrhagic anemia: Secondary | ICD-10-CM | POA: Diagnosis not present

## 2015-03-19 DIAGNOSIS — I69391 Dysphagia following cerebral infarction: Secondary | ICD-10-CM | POA: Diagnosis not present

## 2015-03-21 DIAGNOSIS — I69391 Dysphagia following cerebral infarction: Secondary | ICD-10-CM | POA: Diagnosis not present

## 2015-03-21 DIAGNOSIS — D62 Acute posthemorrhagic anemia: Secondary | ICD-10-CM | POA: Diagnosis not present

## 2015-03-21 DIAGNOSIS — J189 Pneumonia, unspecified organism: Secondary | ICD-10-CM | POA: Diagnosis not present

## 2015-03-21 DIAGNOSIS — I10 Essential (primary) hypertension: Secondary | ICD-10-CM | POA: Diagnosis not present

## 2015-03-21 DIAGNOSIS — I69351 Hemiplegia and hemiparesis following cerebral infarction affecting right dominant side: Secondary | ICD-10-CM | POA: Diagnosis not present

## 2015-03-21 DIAGNOSIS — S72001D Fracture of unspecified part of neck of right femur, subsequent encounter for closed fracture with routine healing: Secondary | ICD-10-CM | POA: Diagnosis not present

## 2015-03-23 DIAGNOSIS — D62 Acute posthemorrhagic anemia: Secondary | ICD-10-CM | POA: Diagnosis not present

## 2015-03-23 DIAGNOSIS — J189 Pneumonia, unspecified organism: Secondary | ICD-10-CM | POA: Diagnosis not present

## 2015-03-23 DIAGNOSIS — I69351 Hemiplegia and hemiparesis following cerebral infarction affecting right dominant side: Secondary | ICD-10-CM | POA: Diagnosis not present

## 2015-03-23 DIAGNOSIS — S72001D Fracture of unspecified part of neck of right femur, subsequent encounter for closed fracture with routine healing: Secondary | ICD-10-CM | POA: Diagnosis not present

## 2015-03-23 DIAGNOSIS — I69391 Dysphagia following cerebral infarction: Secondary | ICD-10-CM | POA: Diagnosis not present

## 2015-03-23 DIAGNOSIS — I10 Essential (primary) hypertension: Secondary | ICD-10-CM | POA: Diagnosis not present

## 2015-03-30 DIAGNOSIS — S72001D Fracture of unspecified part of neck of right femur, subsequent encounter for closed fracture with routine healing: Secondary | ICD-10-CM | POA: Diagnosis not present

## 2015-03-30 DIAGNOSIS — M545 Low back pain: Secondary | ICD-10-CM | POA: Diagnosis not present

## 2015-07-08 ENCOUNTER — Encounter (HOSPITAL_COMMUNITY): Payer: Self-pay | Admitting: *Deleted

## 2015-07-08 ENCOUNTER — Emergency Department (HOSPITAL_COMMUNITY)
Admission: EM | Admit: 2015-07-08 | Discharge: 2015-07-08 | Disposition: A | Discharge status: Home / Self Care | Payer: Medicare Other | Attending: Emergency Medicine | Admitting: Emergency Medicine

## 2015-07-08 DIAGNOSIS — Z8673 Personal history of transient ischemic attack (TIA), and cerebral infarction without residual deficits: Secondary | ICD-10-CM | POA: Diagnosis not present

## 2015-07-08 DIAGNOSIS — N401 Enlarged prostate with lower urinary tract symptoms: Secondary | ICD-10-CM

## 2015-07-08 DIAGNOSIS — R338 Other retention of urine: Secondary | ICD-10-CM

## 2015-07-08 DIAGNOSIS — Z880 Allergy status to penicillin: Secondary | ICD-10-CM | POA: Insufficient documentation

## 2015-07-08 DIAGNOSIS — Z7982 Long term (current) use of aspirin: Secondary | ICD-10-CM | POA: Insufficient documentation

## 2015-07-08 DIAGNOSIS — Z87438 Personal history of other diseases of male genital organs: Secondary | ICD-10-CM | POA: Diagnosis not present

## 2015-07-08 DIAGNOSIS — Z8719 Personal history of other diseases of the digestive system: Secondary | ICD-10-CM | POA: Diagnosis not present

## 2015-07-08 DIAGNOSIS — Z79899 Other long term (current) drug therapy: Secondary | ICD-10-CM | POA: Insufficient documentation

## 2015-07-08 DIAGNOSIS — R339 Retention of urine, unspecified: Secondary | ICD-10-CM | POA: Diagnosis not present

## 2015-07-08 DIAGNOSIS — I10 Essential (primary) hypertension: Secondary | ICD-10-CM | POA: Insufficient documentation

## 2015-07-08 LAB — URINALYSIS, ROUTINE W REFLEX MICROSCOPIC
BILIRUBIN URINE: NEGATIVE
Glucose, UA: NEGATIVE mg/dL
HGB URINE DIPSTICK: NEGATIVE
Ketones, ur: NEGATIVE mg/dL
Leukocytes, UA: NEGATIVE
Nitrite: NEGATIVE
PROTEIN: NEGATIVE mg/dL
SPECIFIC GRAVITY, URINE: 1.021 (ref 1.005–1.030)
UROBILINOGEN UA: 0.2 mg/dL (ref 0.0–1.0)
pH: 5.5 (ref 5.0–8.0)

## 2015-07-08 LAB — BASIC METABOLIC PANEL
Anion gap: 4 — ABNORMAL LOW (ref 5–15)
BUN: 17 mg/dL (ref 6–20)
CALCIUM: 9.1 mg/dL (ref 8.9–10.3)
CO2: 30 mmol/L (ref 22–32)
CREATININE: 0.79 mg/dL (ref 0.61–1.24)
Chloride: 102 mmol/L (ref 101–111)
GFR calc non Af Amer: >60 mL/min (ref >60)
GLUCOSE: 142 mg/dL — AB (ref 65–99)
Potassium: 5 mmol/L (ref 3.5–5.1)
Sodium: 136 mmol/L (ref 135–145)

## 2015-07-08 LAB — CBC WITH DIFFERENTIAL/PLATELET
BASOS PCT: 0 % (ref 0–1)
Basophils Absolute: 0 10*3/uL (ref 0.0–0.1)
EOS ABS: 0.2 10*3/uL (ref 0.0–0.7)
EOS PCT: 3 % (ref 0–5)
HCT: 36.1 % — ABNORMAL LOW (ref 39.0–52.0)
Hemoglobin: 11.3 g/dL — ABNORMAL LOW (ref 13.0–17.0)
Lymphocytes Relative: 18 % (ref 12–46)
Lymphs Abs: 0.9 10*3/uL (ref 0.7–4.0)
MCH: 22.9 pg — ABNORMAL LOW (ref 26.0–34.0)
MCHC: 31.3 g/dL (ref 30.0–36.0)
MCV: 73.1 fL — ABNORMAL LOW (ref 78.0–100.0)
MONO ABS: 0.4 10*3/uL (ref 0.1–1.0)
MONOS PCT: 8 % (ref 3–12)
Neutro Abs: 3.7 10*3/uL (ref 1.7–7.7)
Neutrophils Relative %: 70 % (ref 43–77)
Platelets: 153 10*3/uL (ref 150–400)
RBC: 4.94 MIL/uL (ref 4.22–5.81)
RDW: 13.5 % (ref 11.5–15.5)
WBC: 5.2 10*3/uL (ref 4.0–10.5)

## 2015-07-08 NOTE — ED Notes (Signed)
Bladder scan performed:  126 mL

## 2015-07-08 NOTE — ED Provider Notes (Signed)
CSN: [id number]     Arrival date & time 07/08/15  1541 History   First MD Initiated Contact with Patient 07/08/15 1619     Chief Complaint  Patient presents with  . Urinary Retention     The history is provided by the patient and a relative. No language interpreter was used.   Mr. Conry  Present for evaluation of difficulty urinating. He has not been able to urinate  The last 5 hours. He has a history of enlarged prostate and urinates every 1-2 hours usually. He does have some lower abdominal discomfort and a sensation as if he needs to urinate but he cannot. He denies any fevers, vomiting, diarrhea. He had a hip fracture about 3 weeks ago and has undergone a repair , completed therapy and is currently at home with his wife. He denies any recent medication changes. Symptoms are moderate, constant, worsening.  Past Medical History  Diagnosis Date  . Stroke   . Hypertension   . Enlarged prostate   . Inguinal hernia    Past Surgical History  Procedure Laterality Date  . Brain surgery    . Hip pinning,cannulated Right 02/10/2015    Procedure: CANNULATED HIP PINNING;  Surgeon: Sheral Apley, MD;  Location: [facility] OR;  Service: Orthopedics;  Laterality: Right;   History reviewed. No pertinent family history. Social History  Substance Use Topics  . Smoking status: Never Smoker   . Smokeless tobacco: Never Used  . Alcohol Use: No    Review of Systems  All other systems reviewed and are negative.     Allergies  Penicillins and Baclofen  Home Medications   Prior to Admission medications   Medication Sig Start Date End Date Taking? Authorizing Provider  aspirin 325 MG tablet Take 1 tablet (325 mg total) by mouth daily. 02/10/15   Brittney Tresa Endo, PA-C  docusate sodium (COLACE) 100 MG capsule Take 1 capsule (100 mg total) by mouth 2 (two) times daily. 02/10/15   Brittney Tresa Endo, PA-C  Dutasteride-Tamsulosin HCl 0.5-0.4 MG CAPS Take 1 capsule by mouth daily. 02/22/15   Mcarthur Rossetti Angiulli,  PA-C  fluocinonide-emollient (LIDEX-E) 0.05 % cream Apply topically 3 (three) times daily. Apply to affected area 02/22/15   Mcarthur Rossetti Angiulli, PA-C  oxyCODONE (OXY IR/ROXICODONE) 5 MG immediate release tablet Take 1 tablet (5 mg total) by mouth every 4 (four) hours as needed for moderate pain. 02/22/15   Mcarthur Rossetti Angiulli, PA-C   BP 143/79 mmHg  Pulse 64  Temp(Src) 98 F (36.7 C) (Oral)  Resp 12  SpO2 100% Physical Exam  Constitutional: He appears well-developed and well-nourished.  HENT:  Head: Normocephalic and atraumatic.  Cardiovascular: Normal rate and regular rhythm.   No murmur heard. Pulmonary/Chest: Effort normal and breath sounds normal. No respiratory distress.  Abdominal: Soft. There is no tenderness. There is no rebound and no guarding.  Genitourinary:   Right inguinal hernia that is easily reducible on examination.  Musculoskeletal: He exhibits no edema or tenderness.  Neurological: He is alert.   Right upper extremity weakness  Skin: Skin is warm and dry. There is pallor.  Psychiatric: He has a normal mood and affect. His behavior is normal.  Nursing note and vitals reviewed.   ED Course  Procedures (including critical care time) Labs Review Labs Reviewed  BASIC METABOLIC PANEL - Abnormal; Notable for the following:    Glucose, Bld 142 (*)    Anion gap 4 (*)    All other components within normal limits  CBC WITH DIFFERENTIAL/PLATELET - Abnormal; Notable for the following:    Hemoglobin 11.3 (*)    HCT 36.1 (*)    MCV 73.1 (*)    MCH 22.9 (*)    All other components within normal limits  URINALYSIS, ROUTINE W REFLEX MICROSCOPIC (NOT AT [facility])    Imaging Review No results found. I have personally reviewed and evaluated these images and lab results as part of my medical decision-making.   EKG Interpretation None      MDM   Final diagnoses:  Urinary retention due to benign prostatic hyperplasia    Patient with history of BPH here for evaluation of  difficulty urinating and decreased urinary output. Bladder scan without any evidence of significant retention. Patient is able to void on his own in the emergency department after his inguinal hernia was reduced. No evidence of acute urinary tract infection or renal failure. Discussed with patient and family home care for sensation of urinary retention as well as care and follow-up for his inguinal hernia. Return precautions were discussed.    Tilden Fossa, MD 07/09/15 [phone]

## 2015-07-08 NOTE — Discharge Instructions (Signed)
Benign Prostatic Hyperplasia An enlarged prostate (benign prostatic hyperplasia) is common in older men. You may experience the following:  Weak urine stream.  Dribbling.  Feeling like the bladder has not emptied completely.  Difficulty starting urination.  Getting up frequently at night to urinate.  Urinating more frequently during the day. HOME CARE INSTRUCTIONS  Monitor your prostatic hyperplasia for any changes. The following actions may help to alleviate any discomfort you are experiencing:  Give yourself time when you urinate.  Stay away from alcohol.  Avoid beverages containing caffeine, such as coffee, tea, and colas, because they can make the problem worse.  Avoid decongestants, antihistamines, and some prescription medicines that can make the problem worse.  Follow up with your health care provider for further treatment as recommended. SEEK MEDICAL CARE IF:  You are experiencing progressive difficulty voiding.  Your urine stream is progressively getting narrower.  You are awaking from sleep with the urge to void more frequently.  You are constantly feeling the need to void.  You experience loss of urine, especially in small amounts. SEEK IMMEDIATE MEDICAL CARE IF:   You develop increased pain with urination or are unable to urinate.  You develop severe abdominal pain, vomiting, a high fever, or fainting.  You develop back pain or blood in your urine. MAKE SURE YOU:   Understand these instructions.  Will watch your condition.  Will get help right away if you are not doing well or get worse. Document Released: 10/27/2005 Document Revised: 06/29/2013 Document Reviewed: 03/29/2013 [facility] Patient Information 2015 [place], [place]. This information is not intended to replace advice given to you by your health care provider. Make sure you discuss any questions you have with your health care provider.  Acute Urinary Retention Acute urinary retention is the  temporary inability to urinate. This is a common problem in older men. As men age their prostates become larger and block the flow of urine from the bladder. This is usually a problem that has come on gradually.  HOME CARE INSTRUCTIONS If you are sent home with a Foley catheter and a drainage system, you will need to discuss the best course of action with your health care provider. While the catheter is in, maintain a good intake of fluids. Keep the drainage bag emptied and lower than your catheter. This is so that contaminated urine will not flow back into your bladder, which could lead to a urinary tract infection. There are two main types of drainage bags. One is a large bag that usually is used at night. It has a good capacity that will allow you to sleep through the night without having to empty it. The second type is called a leg bag. It has a smaller capacity, so it needs to be emptied more frequently. However, the main advantage is that it can be attached by a leg strap and can go underneath your clothing, allowing you the freedom to move about or leave your home. Only take over-the-counter or prescription medicines for pain, discomfort, or fever as directed by your health care provider.  SEEK MEDICAL CARE IF:  You develop a low-grade fever.  You experience spasms or leakage of urine with the spasms. SEEK IMMEDIATE MEDICAL CARE IF:   You develop chills or fever.  Your catheter stops draining urine.  Your catheter falls out.  You start to develop increased bleeding that does not respond to rest and increased fluid intake. MAKE SURE YOU:  Understand these instructions.  Will watch your condition.  Will get help right away if you are not doing well or get worse. Document Released: 02/02/2001 Document Revised: 11/01/2013 Document Reviewed: 04/07/2013 [facility] Patient Information 2015 [place], [place]. This information is not intended to replace advice given to you by your health care  provider. Make sure you discuss any questions you have with your health care provider.

## 2015-07-08 NOTE — ED Notes (Signed)
Pt unable to urinate for 5 hours.  Pt states "pain and I can't go pee".  Pt points to lower abdomin and states pain 6/10.  Pt states he is drinking and eating normally without any other medical issues at this time.  Pt had surgery for 3 weeks ago for right fractured hip. Pt has right sided deficents from previous stroke.  Pt son states everything is at baseline except not being able to urinate.

## 2015-09-10 DIAGNOSIS — E785 Hyperlipidemia, unspecified: Secondary | ICD-10-CM | POA: Diagnosis not present

## 2015-09-10 DIAGNOSIS — D696 Thrombocytopenia, unspecified: Secondary | ICD-10-CM | POA: Diagnosis not present

## 2015-09-10 DIAGNOSIS — R7303 Prediabetes: Secondary | ICD-10-CM | POA: Diagnosis not present

## 2015-09-10 DIAGNOSIS — D649 Anemia, unspecified: Secondary | ICD-10-CM | POA: Diagnosis not present

## 2015-09-10 DIAGNOSIS — I10 Essential (primary) hypertension: Secondary | ICD-10-CM | POA: Diagnosis not present

## 2015-09-10 DIAGNOSIS — N400 Enlarged prostate without lower urinary tract symptoms: Secondary | ICD-10-CM | POA: Diagnosis not present

## 2015-09-10 DIAGNOSIS — J309 Allergic rhinitis, unspecified: Secondary | ICD-10-CM | POA: Diagnosis not present

## 2015-09-10 DIAGNOSIS — R7309 Other abnormal glucose: Secondary | ICD-10-CM | POA: Diagnosis not present

## 2015-09-10 DIAGNOSIS — Z23 Encounter for immunization: Secondary | ICD-10-CM | POA: Diagnosis not present

## 2015-09-10 DIAGNOSIS — Z8673 Personal history of transient ischemic attack (TIA), and cerebral infarction without residual deficits: Secondary | ICD-10-CM | POA: Diagnosis not present

## 2016-04-28 ENCOUNTER — Emergency Department (HOSPITAL_BASED_OUTPATIENT_CLINIC_OR_DEPARTMENT_OTHER)
Admit: 2016-04-28 | Discharge: 2016-04-28 | Disposition: A | Discharge status: Home / Self Care | Payer: PPO | Attending: Emergency Medicine | Admitting: Emergency Medicine

## 2016-04-28 ENCOUNTER — Emergency Department (HOSPITAL_COMMUNITY)
Admission: EM | Admit: 2016-04-28 | Discharge: 2016-04-28 | Disposition: A | Discharge status: Home / Self Care | Payer: PPO | Attending: Emergency Medicine | Admitting: Emergency Medicine

## 2016-04-28 DIAGNOSIS — M7989 Other specified soft tissue disorders: Secondary | ICD-10-CM

## 2016-04-28 DIAGNOSIS — Z8673 Personal history of transient ischemic attack (TIA), and cerebral infarction without residual deficits: Secondary | ICD-10-CM | POA: Diagnosis not present

## 2016-04-28 DIAGNOSIS — I10 Essential (primary) hypertension: Secondary | ICD-10-CM | POA: Diagnosis not present

## 2016-04-28 DIAGNOSIS — Z79899 Other long term (current) drug therapy: Secondary | ICD-10-CM | POA: Diagnosis not present

## 2016-04-28 DIAGNOSIS — Z7982 Long term (current) use of aspirin: Secondary | ICD-10-CM | POA: Insufficient documentation

## 2016-04-28 NOTE — Discharge Instructions (Signed)
Edema °Edema is an abnormal buildup of fluids in your body tissues. Edema is somewhat dependent on gravity to pull the fluid to the lowest place in your body. That makes the condition more common in the legs and thighs (lower extremities). Painless swelling of the feet and ankles is common and becomes more likely as you get older. It is also common in looser tissues, like around your eyes.  °When the affected area is squeezed, the fluid may move out of that spot and leave a dent for a few moments. This dent is called pitting.  °CAUSES  °There are many possible causes of edema. Eating too much salt and being on your feet or sitting for a long time can cause edema in your legs and ankles. Hot weather may make edema worse. Common medical causes of edema include: °· Heart failure. °· Liver disease. °· Kidney disease. °· Weak blood vessels in your legs. °· Cancer. °· An injury. °· Pregnancy. °· Some medications. °· Obesity.  °SYMPTOMS  °Edema is usually painless. Your skin may look swollen or shiny.  °DIAGNOSIS  °Your health care provider may be able to diagnose edema by asking about your medical history and doing a physical exam. You may need to have tests such as X-rays, an electrocardiogram, or blood tests to check for medical conditions that may cause edema.  °TREATMENT  °Edema treatment depends on the cause. If you have heart, liver, or kidney disease, you need the treatment appropriate for these conditions. General treatment may include: °· Elevation of the affected body part above the level of your heart. °· Compression of the affected body part. Pressure from elastic bandages or support stockings squeezes the tissues and forces fluid back into the blood vessels. This keeps fluid from entering the tissues. °· Restriction of fluid and salt intake. °· Use of a water pill (diuretic). These medications are appropriate only for some types of edema. They pull fluid out of your body and make you urinate more often. This  gets rid of fluid and reduces swelling, but diuretics can have side effects. Only use diuretics as directed by your health care provider. °HOME CARE INSTRUCTIONS  °· Keep the affected body part above the level of your heart when you are lying down.   °· Do not sit still or stand for prolonged periods.   °· Do not put anything directly under your knees when lying down. °· Do not wear constricting clothing or garters on your upper legs.   °· Exercise your legs to work the fluid back into your blood vessels. This may help the swelling go down.   °· Wear elastic bandages or support stockings to reduce ankle swelling as directed by your health care provider.   °· Eat a low-salt diet to reduce fluid if your health care provider recommends it.   °· Only take medicines as directed by your health care provider.  °SEEK MEDICAL CARE IF:  °· Your edema is not responding to treatment. °· You have heart, liver, or kidney disease and notice symptoms of edema. °· You have edema in your legs that does not improve after elevating them.   °· You have sudden and unexplained weight gain. °SEEK IMMEDIATE MEDICAL CARE IF:  °· You develop shortness of breath or chest pain.   °· You cannot breathe when you lie down. °· You develop pain, redness, or warmth in the swollen areas.   °· You have heart, liver, or kidney disease and suddenly get edema. °· You have a fever and your symptoms suddenly get worse. °MAKE SURE YOU:  °·   Understand these instructions. °· Will watch your condition. °· Will get help right away if you are not doing well or get worse. °  °This information is not intended to replace advice given to you by your health care provider. Make sure you discuss any questions you have with your health care provider. °  °Document Released: 10/27/2005 Document Revised: 11/17/2014 Document Reviewed: 08/19/2013 °Elsevier Interactive Patient Education ©2016 [employer] ° °

## 2016-04-28 NOTE — ED Notes (Signed)
Pt transported to [facility]

## 2016-04-28 NOTE — ED Provider Notes (Signed)
CSN: [id number]     Arrival date & time 04/28/16  1029 History   First MD Initiated Contact with Patient 04/28/16 1037     Chief Complaint  Patient presents with  . Leg Swelling      HPI Patient is brought to the emergency department by his wife and son for ongoing swelling of the right lower extremity.  Swelling is been present for 2 weeks and does not seem to be improving with elevation and compression stockings.  No history DVT.  Noted to have strong right PT and DP pulse.  No warmth or redness noted by family.  Isolated swelling.  No swelling noted above the right knee by family.  Patient denies pain in the right foot or right ankle.  No recent injury or trauma.  Patient is brought to the emergency department as family is concerned about the possibility of a DVT   Past Medical History  Diagnosis Date  . Stroke   . Hypertension   . Enlarged prostate   . Inguinal hernia    Past Surgical History  Procedure Laterality Date  . Brain surgery    . Hip pinning,cannulated Right 02/10/2015    Procedure: CANNULATED HIP PINNING;  Surgeon: Sheral Apley, MD;  Location: [facility] OR;  Service: Orthopedics;  Laterality: Right;   No family history on file. Social History  Substance Use Topics  . Smoking status: Never Smoker   . Smokeless tobacco: Never Used  . Alcohol Use: No    Review of Systems  All other systems reviewed and are negative.     Allergies  Baclofen and Penicillins  Home Medications   Prior to Admission medications   Medication Sig Start Date End Date Taking? Authorizing Provider  aspirin 325 MG tablet Take 1 tablet (325 mg total) by mouth daily. 02/10/15   Brittney Tresa Endo, PA-C  docusate sodium (COLACE) 100 MG capsule Take 1 capsule (100 mg total) by mouth 2 (two) times daily. 02/10/15   Brittney Tresa Endo, PA-C  Dutasteride-Tamsulosin HCl 0.5-0.4 MG CAPS Take 1 capsule by mouth daily. 02/22/15   Mcarthur Rossetti Angiulli, PA-C  fluocinonide-emollient (LIDEX-E) 0.05 % cream Apply  topically 3 (three) times daily. Apply to affected area 02/22/15   Mcarthur Rossetti Angiulli, PA-C  oxyCODONE (OXY IR/ROXICODONE) 5 MG immediate release tablet Take 1 tablet (5 mg total) by mouth every 4 (four) hours as needed for moderate pain. 02/22/15   Daniel J Angiulli, PA-C  TOPROL XL 50 MG 24 hr tablet Take 50 mg by mouth daily. 05/03/15   Historical Provider, MD   BP 136/80 mmHg  Pulse 59  Temp(Src) 97.6 F (36.4 C)  Resp 15  SpO2 98% Physical Exam  Constitutional: He is oriented to person, place, and time. He appears well-developed and well-nourished.  HENT:  Head: Normocephalic.  Eyes: EOM are normal.  Neck: Normal range of motion.  Pulmonary/Chest: Effort normal.  Abdominal: He exhibits no distension.  Musculoskeletal: Normal range of motion.  Mild swelling of the right foot right ankle as compared to the left.  Normal PT and DP pulse of the right foot.  Full range of motion of right hip and right knee as well as right ankle.  No warmth or erythema to the right foot or ankle  Neurological: He is alert and oriented to person, place, and time.  Psychiatric: He has a normal mood and affect.  Nursing note and vitals reviewed.   ED Course  Procedures (including critical care time) Labs Review Labs Reviewed -  No data to display  Imaging Review No results found. I have personally reviewed and evaluated these images and lab results as part of my medical decision-making.   EKG Interpretation None      MDM   Final diagnoses:  None    Venous duplex is negative for DVT of the right lower extremity.  Normal arterial pulses in the right foot.  No warmth or erythema to suggest cellulitis or infection.  No pain with range of motion of the right ankle to suggest gout.  Patient be discharged home with ongoing elevation compression stockings at primary care follow-up.  No indication for additional workup today.  All questions answered    Azalia BilisKevin Dalaysia Harms, MD 04/28/16 1300

## 2016-04-28 NOTE — ED Notes (Signed)
Pt from home, 2 weeks ago noted swelling to right leg. Pt has been wearing compression stockings w/o relief of symptoms. Foot warm, dry, painless, pulses strong.

## 2016-04-28 NOTE — Progress Notes (Signed)
*  Preliminary Results* Right lower extremity venous duplex completed. Right lower extremity is negative for deep vein thrombosis. There is no evidence of right Baker's cyst.  04/28/2016 12:10 PM  Gertie FeyMichelle Kayvon Mo, RVT, RDCS, RDMS

## 2016-08-19 DIAGNOSIS — K4020 Bilateral inguinal hernia, without obstruction or gangrene, not specified as recurrent: Secondary | ICD-10-CM | POA: Diagnosis not present

## 2016-08-25 ENCOUNTER — Ambulatory Visit: Payer: Self-pay | Admitting: General Surgery

## 2016-08-29 ENCOUNTER — Ambulatory Visit (HOSPITAL_COMMUNITY)
Admission: RE | Admit: 2016-08-29 | Discharge: 2016-08-29 | Disposition: A | Discharge status: Home / Self Care | Payer: PPO | Source: Ambulatory Visit | Attending: General Surgery | Admitting: General Surgery

## 2016-08-29 ENCOUNTER — Encounter (HOSPITAL_COMMUNITY): Payer: Self-pay

## 2016-08-29 ENCOUNTER — Encounter (HOSPITAL_COMMUNITY)
Admission: RE | Admit: 2016-08-29 | Discharge: 2016-08-29 | Disposition: A | Discharge status: Home / Self Care | Payer: PPO | Source: Ambulatory Visit | Attending: General Surgery | Admitting: General Surgery

## 2016-08-29 DIAGNOSIS — Z01812 Encounter for preprocedural laboratory examination: Secondary | ICD-10-CM | POA: Insufficient documentation

## 2016-08-29 DIAGNOSIS — Z01818 Encounter for other preprocedural examination: Secondary | ICD-10-CM | POA: Insufficient documentation

## 2016-08-29 DIAGNOSIS — Z01810 Encounter for preprocedural cardiovascular examination: Secondary | ICD-10-CM | POA: Insufficient documentation

## 2016-08-29 DIAGNOSIS — R001 Bradycardia, unspecified: Secondary | ICD-10-CM | POA: Diagnosis not present

## 2016-08-29 HISTORY — DX: Respiratory tuberculosis unspecified: A15.9

## 2016-08-29 LAB — CBC WITH DIFFERENTIAL/PLATELET
BASOS PCT: 1 %
Basophils Absolute: 0.1 10*3/uL (ref 0.0–0.1)
EOS ABS: 0.3 10*3/uL (ref 0.0–0.7)
Eosinophils Relative: 5 %
HCT: 39.9 % (ref 39.0–52.0)
HEMOGLOBIN: 12.2 g/dL — AB (ref 13.0–17.0)
LYMPHS PCT: 18 %
Lymphs Abs: 1.2 10*3/uL (ref 0.7–4.0)
MCH: 22.6 pg — AB (ref 26.0–34.0)
MCHC: 30.6 g/dL (ref 30.0–36.0)
MCV: 73.8 fL — ABNORMAL LOW (ref 78.0–100.0)
Monocytes Absolute: 0.5 10*3/uL (ref 0.1–1.0)
Monocytes Relative: 7 %
NEUTROS PCT: 69 %
Neutro Abs: 4.5 10*3/uL (ref 1.7–7.7)
Platelets: 177 10*3/uL (ref 150–400)
RBC: 5.41 MIL/uL (ref 4.22–5.81)
RDW: 13.4 % (ref 11.5–15.5)
WBC: 6.6 10*3/uL (ref 4.0–10.5)

## 2016-08-29 LAB — COMPREHENSIVE METABOLIC PANEL
ALBUMIN: 4.1 g/dL (ref 3.5–5.0)
ALK PHOS: 47 U/L (ref 38–126)
ALT: 19 U/L (ref 17–63)
AST: 23 U/L (ref 15–41)
Anion gap: 6 (ref 5–15)
BUN: 16 mg/dL (ref 6–20)
CALCIUM: 9 mg/dL (ref 8.9–10.3)
CO2: 26 mmol/L (ref 22–32)
CREATININE: 0.8 mg/dL (ref 0.61–1.24)
Chloride: 104 mmol/L (ref 101–111)
GFR calc non Af Amer: >60 mL/min (ref >60)
GLUCOSE: 104 mg/dL — AB (ref 65–99)
Potassium: 4 mmol/L (ref 3.5–5.1)
Sodium: 136 mmol/L (ref 135–145)
Total Bilirubin: 0.6 mg/dL (ref 0.3–1.2)
Total Protein: 7.5 g/dL (ref 6.5–8.1)

## 2016-08-29 LAB — TYPE AND SCREEN
ABO/RH(D): B POS
Antibody Screen: NEGATIVE

## 2016-08-29 LAB — PROTIME-INR
INR: 1.09
Prothrombin Time: 14.1 seconds (ref 11.4–15.2)

## 2016-08-29 NOTE — Progress Notes (Addendum)
Mr [name]  Is [place], he answered name, date of birth in [place].  Patients son, [name] said that patient would not be able to answer some of the questions due to history of 2 strokes and aging.  Mr [name] had a stroke in 2000 which left him with right sided weakness. Patient ambulates very little, uses wheel chair.  Patient denied chest pain or shortness of breath.           Delle ReiningAnglea Kappe , NP for anesthesia looked at EKG, no change from last EKG noted.

## 2016-08-29 NOTE — Pre-Procedure Instructions (Signed)
    [name] LagosJimmy L [name]  08/29/2016   Your procedure is scheduled on Tuesday, October 24.  [facility]:30 AM                 Your surgery or procedure is scheduled for 7:30 AM   Call this number if you have problems the morning of [phone]               Remember:  Do not eat food or drink liquids after midnight Monday, October 23.  Take these medicines the morning of surgery with A SIP OF WATER:  Dutasteride-Tamsulosin ( Jalyn), TOPROL XL.               May use eye drops.                Stop taking Aspirin, Aspirin Products and herbal medications.  Do not take any NSAIDS ie:  Ibuprofen, Advil, Naproxen.   Do not wear jewelry, make-up or nail polish.  Do not wear lotions, powders, or perfumes, or deodorant.  Men may shave face and neck.  Do not bring valuables to the hospital.  [facility] is not [facility] for any belongings or valuables.  Contacts, dentures or bridgework may not be worn into surgery.  Leave your suitcase in the car.  After surgery it may be brought to your room.  For patients admitted to the hospital, discharge time will be determined by your treatment team.  Patients discharged the day of surgery will not be allowed to drive home.   Name and phone number of your driver: - Special instructions:  Review  [facility] - Preparing For Surgery.  Please read over the following fact sheets that you were given. [facility]- Preparing For Surgery and Patient Instructions for Mupirocin Application, Coughing and Deep Breathing, Pain Booklet

## 2016-09-01 NOTE — Anesthesia Preprocedure Evaluation (Addendum)
Anesthesia Evaluation  Patient identified by MRN, date of birth, ID band Patient awake    Reviewed: Allergy & Precautions, NPO status , Patient's Chart, lab work & pertinent test results  Airway Mallampati: II  TM Distance: >3 FB Neck ROM: Full    Dental  (+) Dental Advisory Given   Pulmonary neg pulmonary ROS,    breath sounds clear to auscultation       Cardiovascular hypertension, Pt. on medications and Pt. on home beta blockers  Rhythm:Regular Rate:Normal     Neuro/Psych CVA, Residual Symptoms    GI/Hepatic negative GI ROS, Neg liver ROS,   Endo/Other  negative endocrine ROS  Renal/GU negative Renal ROS     Musculoskeletal   Abdominal   Peds  Hematology  (+) anemia ,   Anesthesia Other Findings   Reproductive/Obstetrics                            Lab Results  Component Value Date   WBC 6.6 08/29/2016   HGB 12.2 (L) 08/29/2016   HCT 39.9 08/29/2016   MCV 73.8 (L) 08/29/2016   PLT 177 08/29/2016   Lab Results  Component Value Date   CREATININE 0.80 08/29/2016   BUN 16 08/29/2016   NA 136 08/29/2016   K 4.0 08/29/2016   CL 104 08/29/2016   CO2 26 08/29/2016    Anesthesia Physical Anesthesia Plan  ASA: III  Anesthesia Plan: General   Post-op Pain Management:    Induction: Intravenous  Airway Management Planned: LMA and Oral ETT  Additional Equipment:   Intra-op Plan:   Post-operative Plan: Extubation in OR  Informed Consent: I have reviewed the patients History and Physical, chart, labs and discussed the procedure including the risks, benefits and alternatives for the proposed anesthesia with the patient or authorized representative who has indicated his/her understanding and acceptance.   Dental advisory given  Plan Discussed with: CRNA  Anesthesia Plan Comments:        Anesthesia Quick Evaluation

## 2016-09-02 ENCOUNTER — Encounter (HOSPITAL_COMMUNITY)
Admission: RE | Disposition: A | Discharge status: Home / Self Care | Payer: Self-pay | Source: Ambulatory Visit | Attending: General Surgery

## 2016-09-02 ENCOUNTER — Observation Stay (HOSPITAL_COMMUNITY)
Admission: RE | Admit: 2016-09-02 | Discharge: 2016-09-04 | Disposition: A | Discharge status: Home / Self Care | Payer: PPO | Source: Ambulatory Visit | Attending: General Surgery | Admitting: General Surgery

## 2016-09-02 ENCOUNTER — Encounter (HOSPITAL_COMMUNITY): Payer: Self-pay | Admitting: General Practice

## 2016-09-02 ENCOUNTER — Ambulatory Visit (HOSPITAL_COMMUNITY): Payer: PPO | Admitting: Emergency Medicine

## 2016-09-02 ENCOUNTER — Ambulatory Visit (HOSPITAL_COMMUNITY): Payer: PPO | Admitting: Anesthesiology

## 2016-09-02 DIAGNOSIS — Z880 Allergy status to penicillin: Secondary | ICD-10-CM | POA: Diagnosis not present

## 2016-09-02 DIAGNOSIS — Z23 Encounter for immunization: Secondary | ICD-10-CM | POA: Insufficient documentation

## 2016-09-02 DIAGNOSIS — Z888 Allergy status to other drugs, medicaments and biological substances status: Secondary | ICD-10-CM | POA: Diagnosis not present

## 2016-09-02 DIAGNOSIS — I10 Essential (primary) hypertension: Secondary | ICD-10-CM | POA: Insufficient documentation

## 2016-09-02 DIAGNOSIS — G8918 Other acute postprocedural pain: Secondary | ICD-10-CM | POA: Diagnosis not present

## 2016-09-02 DIAGNOSIS — D649 Anemia, unspecified: Secondary | ICD-10-CM | POA: Insufficient documentation

## 2016-09-02 DIAGNOSIS — K4020 Bilateral inguinal hernia, without obstruction or gangrene, not specified as recurrent: Secondary | ICD-10-CM | POA: Diagnosis not present

## 2016-09-02 DIAGNOSIS — Z8673 Personal history of transient ischemic attack (TIA), and cerebral infarction without residual deficits: Secondary | ICD-10-CM | POA: Insufficient documentation

## 2016-09-02 HISTORY — PX: INGUINAL HERNIA REPAIR: SUR1180

## 2016-09-02 HISTORY — PX: INSERTION OF MESH: SHX5868

## 2016-09-02 HISTORY — PX: INGUINAL HERNIA REPAIR: SHX194

## 2016-09-02 SURGERY — REPAIR, HERNIA, INGUINAL, ADULT
Anesthesia: General | Site: Groin | Laterality: Right

## 2016-09-02 MED ORDER — BUPIVACAINE HCL (PF) 0.25 % IJ SOLN
INTRAMUSCULAR | Status: DC | PRN
  Administered 2016-09-02: 10 mL

## 2016-09-02 MED ORDER — LIDOCAINE 2% (20 MG/ML) 5 ML SYRINGE
INTRAMUSCULAR | Status: AC
  Filled 2016-09-02: qty 10

## 2016-09-02 MED ORDER — PROPOFOL 10 MG/ML IV BOLUS
INTRAVENOUS | Status: AC
  Filled 2016-09-02: qty 20

## 2016-09-02 MED ORDER — GABAPENTIN 300 MG PO CAPS
300.0000 mg | ORAL_CAPSULE | ORAL | Status: AC
  Administered 2016-09-02: 300 mg via ORAL
  Filled 2016-09-02: qty 1

## 2016-09-02 MED ORDER — SUGAMMADEX SODIUM 200 MG/2ML IV SOLN
INTRAVENOUS | Status: DC | PRN
  Administered 2016-09-02: 100 mg via INTRAVENOUS

## 2016-09-02 MED ORDER — CHLORHEXIDINE GLUCONATE CLOTH 2 % EX PADS: 6.0000 | MEDICATED_PAD | Freq: Once | CUTANEOUS | Status: DC

## 2016-09-02 MED ORDER — ROCURONIUM BROMIDE 100 MG/10ML IV SOLN
INTRAVENOUS | Status: DC | PRN
  Administered 2016-09-02: 30 mg via INTRAVENOUS
  Administered 2016-09-02: 10 mg via INTRAVENOUS

## 2016-09-02 MED ORDER — BUPIVACAINE-EPINEPHRINE (PF) 0.25% -1:200000 IJ SOLN
INTRAMUSCULAR | Status: DC | PRN
  Administered 2016-09-02 (×2): 25 mL

## 2016-09-02 MED ORDER — MORPHINE SULFATE (PF) 2 MG/ML IV SOLN: 1.0000 mg | INTRAVENOUS | Status: DC | PRN

## 2016-09-02 MED ORDER — ONDANSETRON HCL 4 MG/2ML IJ SOLN
INTRAMUSCULAR | Status: AC
  Filled 2016-09-02: qty 2

## 2016-09-02 MED ORDER — ROCURONIUM BROMIDE 10 MG/ML (PF) SYRINGE
PREFILLED_SYRINGE | INTRAVENOUS | Status: AC
  Filled 2016-09-02: qty 10

## 2016-09-02 MED ORDER — FENTANYL CITRATE (PF) 100 MCG/2ML IJ SOLN
25.0000 ug | INTRAMUSCULAR | Status: DC | PRN
  Administered 2016-09-02 (×3): 25 ug via INTRAVENOUS

## 2016-09-02 MED ORDER — BISACODYL 10 MG RE SUPP: 10.0000 mg | Freq: Every day | RECTAL | Status: DC | PRN

## 2016-09-02 MED ORDER — POLYMYXIN B SULFATE 500000 UNITS IJ SOLR
INTRAMUSCULAR | Status: DC | PRN
  Administered 2016-09-02: 500 mL

## 2016-09-02 MED ORDER — METOPROLOL SUCCINATE ER 50 MG PO TB24
50.0000 mg | ORAL_TABLET | Freq: Every day | ORAL | Status: DC
  Administered 2016-09-04: 50 mg via ORAL
  Filled 2016-09-02: qty 1

## 2016-09-02 MED ORDER — ACETAMINOPHEN 500 MG PO TABS
1000.0000 mg | ORAL_TABLET | Freq: Three times a day (TID) | ORAL | Status: DC
  Administered 2016-09-02 – 2016-09-03 (×3): 1000 mg via ORAL
  Filled 2016-09-02 (×5): qty 2

## 2016-09-02 MED ORDER — ACETAMINOPHEN 500 MG PO TABS
1000.0000 mg | ORAL_TABLET | ORAL | Status: AC
  Administered 2016-09-02: 1000 mg via ORAL
  Filled 2016-09-02: qty 2

## 2016-09-02 MED ORDER — ENOXAPARIN SODIUM 30 MG/0.3ML ~~LOC~~ SOLN
30.0000 mg | SUBCUTANEOUS | Status: DC
  Administered 2016-09-03 – 2016-09-04 (×2): 30 mg via SUBCUTANEOUS
  Filled 2016-09-02 (×2): qty 0.3

## 2016-09-02 MED ORDER — OXYCODONE HCL 5 MG PO TABS: 5.0000 mg | ORAL_TABLET | ORAL | Status: DC | PRN

## 2016-09-02 MED ORDER — LACTATED RINGERS IV SOLN
INTRAVENOUS | Status: DC | PRN
  Administered 2016-09-02: 07:00:00 via INTRAVENOUS

## 2016-09-02 MED ORDER — LIDOCAINE 2% (20 MG/ML) 5 ML SYRINGE
INTRAMUSCULAR | Status: AC
  Filled 2016-09-02: qty 5

## 2016-09-02 MED ORDER — HYDROCODONE-ACETAMINOPHEN 7.5-325 MG PO TABS: 1.0000 | ORAL_TABLET | Freq: Once | ORAL | Status: DC | PRN

## 2016-09-02 MED ORDER — DUTASTERIDE 0.5 MG PO CAPS
0.5000 mg | ORAL_CAPSULE | Freq: Every day | ORAL | Status: DC
  Administered 2016-09-03: 0.5 mg via ORAL
  Filled 2016-09-02 (×2): qty 1

## 2016-09-02 MED ORDER — KCL IN DEXTROSE-NACL 10-5-0.45 MEQ/L-%-% IV SOLN
INTRAVENOUS | Status: DC
  Administered 2016-09-02 – 2016-09-03 (×3): via INTRAVENOUS
  Filled 2016-09-02 (×4): qty 1000

## 2016-09-02 MED ORDER — INFLUENZA VAC SPLIT QUAD 0.5 ML IM SUSY
0.5000 mL | PREFILLED_SYRINGE | INTRAMUSCULAR | Status: AC
Start: 2016-09-03 — End: 2016-09-04
  Administered 2016-09-04: 0.5 mL via INTRAMUSCULAR
  Filled 2016-09-02: qty 0.5

## 2016-09-02 MED ORDER — LIDOCAINE HCL 4 % EX SOLN
CUTANEOUS | Status: DC | PRN
  Administered 2016-09-02: 2 mL via TOPICAL

## 2016-09-02 MED ORDER — DOCUSATE SODIUM 100 MG PO CAPS
100.0000 mg | ORAL_CAPSULE | Freq: Two times a day (BID) | ORAL | Status: DC
  Administered 2016-09-03 – 2016-09-04 (×3): 100 mg via ORAL
  Filled 2016-09-02 (×4): qty 1

## 2016-09-02 MED ORDER — KETOTIFEN FUMARATE 0.025 % OP SOLN
1.0000 [drp] | Freq: Two times a day (BID) | OPHTHALMIC | Status: DC | PRN
  Filled 2016-09-02: qty 5

## 2016-09-02 MED ORDER — PHENYLEPHRINE 40 MCG/ML (10ML) SYRINGE FOR IV PUSH (FOR BLOOD PRESSURE SUPPORT)
PREFILLED_SYRINGE | INTRAVENOUS | Status: AC
  Filled 2016-09-02: qty 10

## 2016-09-02 MED ORDER — TAMSULOSIN HCL 0.4 MG PO CAPS
0.4000 mg | ORAL_CAPSULE | Freq: Every day | ORAL | Status: DC
  Administered 2016-09-03 – 2016-09-04 (×2): 0.4 mg via ORAL
  Filled 2016-09-02 (×2): qty 1

## 2016-09-02 MED ORDER — ONDANSETRON HCL 4 MG/2ML IJ SOLN
INTRAMUSCULAR | Status: DC | PRN
  Administered 2016-09-02: 4 mg via INTRAVENOUS

## 2016-09-02 MED ORDER — FENTANYL CITRATE (PF) 100 MCG/2ML IJ SOLN
INTRAMUSCULAR | Status: AC
  Filled 2016-09-02: qty 2

## 2016-09-02 MED ORDER — ONDANSETRON 4 MG PO TBDP: 4.0000 mg | ORAL_TABLET | Freq: Four times a day (QID) | ORAL | Status: DC | PRN

## 2016-09-02 MED ORDER — PHENYLEPHRINE HCL 10 MG/ML IJ SOLN
INTRAMUSCULAR | Status: DC | PRN
  Administered 2016-09-02: 25 ug/min via INTRAVENOUS

## 2016-09-02 MED ORDER — CIPROFLOXACIN IN D5W 400 MG/200ML IV SOLN
400.0000 mg | Freq: Two times a day (BID) | INTRAVENOUS | Status: AC
  Administered 2016-09-02: 400 mg via INTRAVENOUS
  Filled 2016-09-02: qty 200

## 2016-09-02 MED ORDER — CIPROFLOXACIN IN D5W 400 MG/200ML IV SOLN
400.0000 mg | INTRAVENOUS | Status: AC
  Administered 2016-09-02: 400 mg via INTRAVENOUS
  Filled 2016-09-02: qty 200

## 2016-09-02 MED ORDER — ONDANSETRON HCL 4 MG/2ML IJ SOLN
4.0000 mg | Freq: Four times a day (QID) | INTRAMUSCULAR | Status: DC | PRN
Start: 2016-09-02 — End: 2016-09-04

## 2016-09-02 MED ORDER — FENTANYL CITRATE (PF) 100 MCG/2ML IJ SOLN
INTRAMUSCULAR | Status: AC
  Administered 2016-09-02: 25 ug via INTRAVENOUS
  Filled 2016-09-02: qty 2

## 2016-09-02 MED ORDER — BUPIVACAINE HCL (PF) 0.25 % IJ SOLN
INTRAMUSCULAR | Status: AC
  Filled 2016-09-02: qty 30

## 2016-09-02 MED ORDER — FENTANYL CITRATE (PF) 100 MCG/2ML IJ SOLN
INTRAMUSCULAR | Status: DC | PRN
  Administered 2016-09-02: 50 ug via INTRAVENOUS
  Administered 2016-09-02: 25 ug via INTRAVENOUS

## 2016-09-02 MED ORDER — EPHEDRINE 5 MG/ML INJ
INTRAVENOUS | Status: AC
  Filled 2016-09-02: qty 10

## 2016-09-02 MED ORDER — DUTASTERIDE-TAMSULOSIN HCL 0.5-0.4 MG PO CAPS: 1.0000 | ORAL_CAPSULE | Freq: Every day | ORAL | Status: DC

## 2016-09-02 MED ORDER — SUGAMMADEX SODIUM 200 MG/2ML IV SOLN
INTRAVENOUS | Status: AC
  Filled 2016-09-02: qty 2

## 2016-09-02 MED ORDER — 0.9 % SODIUM CHLORIDE (POUR BTL) OPTIME
TOPICAL | Status: DC | PRN
  Administered 2016-09-02: 1000 mL

## 2016-09-02 MED ORDER — POLYETHYLENE GLYCOL 3350 17 G PO PACK: 17.0000 g | PACK | Freq: Every day | ORAL | Status: DC | PRN

## 2016-09-02 MED ORDER — PROPOFOL 10 MG/ML IV BOLUS
INTRAVENOUS | Status: DC | PRN
  Administered 2016-09-02: 100 mg via INTRAVENOUS

## 2016-09-02 MED ORDER — LIDOCAINE HCL (CARDIAC) 20 MG/ML IV SOLN
INTRAVENOUS | Status: DC | PRN
Start: 2016-09-02 — End: 2016-09-02
  Administered 2016-09-02: 20 mg via INTRAVENOUS

## 2016-09-02 MED ORDER — ESMOLOL HCL 100 MG/10ML IV SOLN
INTRAVENOUS | Status: AC
Start: 2016-09-02 — End: 2016-09-02
  Filled 2016-09-02: qty 10

## 2016-09-02 MED ORDER — SUCCINYLCHOLINE CHLORIDE 200 MG/10ML IV SOSY
PREFILLED_SYRINGE | INTRAVENOUS | Status: AC
  Filled 2016-09-02: qty 10

## 2016-09-02 MED ORDER — ESMOLOL HCL 100 MG/10ML IV SOLN
INTRAVENOUS | Status: DC | PRN
  Administered 2016-09-02: 20 mg via INTRAVENOUS

## 2016-09-02 SURGICAL SUPPLY — 52 items
ADH SKN CLS APL DERMABOND .7 (GAUZE/BANDAGES/DRESSINGS) ×4
BAG DECANTER FOR FLEXI CONT (MISCELLANEOUS) ×3 IMPLANT
BLADE SURG 10 STRL SS (BLADE) ×3 IMPLANT
BLADE SURG 15 STRL LF DISP TIS (BLADE) ×2 IMPLANT
BLADE SURG 15 STRL SS (BLADE) ×3
BLADE SURG ROTATE 9660 (MISCELLANEOUS) ×1 IMPLANT
CANISTER SUCTION 2500CC (MISCELLANEOUS) ×1 IMPLANT
CHLORAPREP W/TINT 26ML (MISCELLANEOUS) ×3 IMPLANT
CLEANER TIP ELECTROSURG 2X2 (MISCELLANEOUS) ×3 IMPLANT
COVER SURGICAL LIGHT HANDLE (MISCELLANEOUS) ×3 IMPLANT
DERMABOND ADVANCED (GAUZE/BANDAGES/DRESSINGS) ×2
DERMABOND ADVANCED .7 DNX12 (GAUZE/BANDAGES/DRESSINGS) ×2 IMPLANT
DRAIN PENROSE 1/2X12 LTX STRL (WOUND CARE) ×1 IMPLANT
DRAPE LAPAROTOMY TRNSV 102X78 (DRAPE) ×3 IMPLANT
DRAPE UTILITY XL STRL (DRAPES) ×3 IMPLANT
DRSG TEGADERM 4X4.75 (GAUZE/BANDAGES/DRESSINGS) ×4 IMPLANT
ELECT REM PT RETURN 9FT ADLT (ELECTROSURGICAL) ×3
ELECTRODE REM PT RTRN 9FT ADLT (ELECTROSURGICAL) ×2 IMPLANT
GLOVE BIO SURGEON STRL SZ7 (GLOVE) ×2 IMPLANT
GLOVE BIO SURGEON STRL SZ7.5 (GLOVE) ×2 IMPLANT
GLOVE BIOGEL PI IND STRL 7.0 (GLOVE) IMPLANT
GLOVE BIOGEL PI IND STRL 7.5 (GLOVE) IMPLANT
GLOVE BIOGEL PI IND STRL 8 (GLOVE) ×2 IMPLANT
GLOVE BIOGEL PI INDICATOR 7.0 (GLOVE) ×2
GLOVE BIOGEL PI INDICATOR 7.5 (GLOVE) ×2
GLOVE BIOGEL PI INDICATOR 8 (GLOVE) ×1
GLOVE ECLIPSE 7.5 STRL STRAW (GLOVE) ×3 IMPLANT
GOWN STRL REUS W/ TWL LRG LVL3 (GOWN DISPOSABLE) ×4 IMPLANT
GOWN STRL REUS W/TWL LRG LVL3 (GOWN DISPOSABLE) ×9
KIT BASIN OR (CUSTOM PROCEDURE TRAY) ×3 IMPLANT
KIT ROOM TURNOVER OR (KITS) ×3 IMPLANT
MESH HERNIA 3X6 (Mesh General) ×1 IMPLANT
NDL HYPO 25GX1X1/2 BEV (NEEDLE) ×2 IMPLANT
NEEDLE HYPO 25GX1X1/2 BEV (NEEDLE) ×3 IMPLANT
NS IRRIG 1000ML POUR BTL (IV SOLUTION) ×3 IMPLANT
PACK SURGICAL SETUP 50X90 (CUSTOM PROCEDURE TRAY) ×3 IMPLANT
PAD ARMBOARD 7.5X6 YLW CONV (MISCELLANEOUS) ×3 IMPLANT
PENCIL BUTTON HOLSTER BLD 10FT (ELECTRODE) ×3 IMPLANT
SPONGE LAP 18X18 X RAY DECT (DISPOSABLE) ×3 IMPLANT
STRIP CLOSURE SKIN 1/2X4 (GAUZE/BANDAGES/DRESSINGS) ×3 IMPLANT
SUT ETHIBOND 0 MO6 C/R (SUTURE) ×3 IMPLANT
SUT MON AB 4-0 PC3 18 (SUTURE) ×4 IMPLANT
SUT PROLENE 0 CT 2 (SUTURE) ×8 IMPLANT
SUT VIC AB 3-0 SH 27 (SUTURE) ×12
SUT VIC AB 3-0 SH 27X BRD (SUTURE) ×4 IMPLANT
SUT VIC AB 3-0 SH 27XBRD (SUTURE) IMPLANT
SUT VICRYL AB 3 0 TIES (SUTURE) ×3 IMPLANT
SYR BULB 3OZ (MISCELLANEOUS) ×3 IMPLANT
SYR CONTROL 10ML LL (SYRINGE) ×3 IMPLANT
TOWEL OR 17X26 10 PK STRL BLUE (TOWEL DISPOSABLE) ×3 IMPLANT
TUBE CONNECTING 12X1/4 (SUCTIONS) ×1 IMPLANT
YANKAUER SUCT BULB TIP NO VENT (SUCTIONS) ×1 IMPLANT

## 2016-09-02 NOTE — Op Note (Signed)
OPERATIVE REPORT  DATE OF OPERATION: 09/02/2016  PATIENT:  [name]  82 y.o. male  PRE-OPERATIVE DIAGNOSIS:  BILATERAL INGUINAL HERNIA  POST-OPERATIVE DIAGNOSIS:  BILATERAL INGUINAL HERNIA  INDICATION(S) FOR OPERATION:  Symptomatic bilateral inguinal hernias  FINDINGS:  Large indirecte hernia on the right, moderate on the left, pantaloon on the right.  PROCEDURE:  Procedure(s): OPEN BILATERAL INGUINAL HERNIA REPAIR INSERTION OF MESH  SURGEON:  Surgeon(s): Jimmye Norman, MD  ASSISTANTOrson Slick, RNFA  ANESTHESIA:   local and general/TAP bilaterally  COMPLICATIONS:  None  EBL: <30 ml  BLOOD ADMINISTERED: none  DRAINS: Urinary Catheter (Foley)   SPECIMEN:  No Specimen  COUNTS CORRECT:  YES  PROCEDURE DETAILS: The patient was taken to the operating room and placed on the table in the supine position. He received a trans-abdominal peritoneal block prior to intubation in the operating room for the procedure. He was placed on the table in the supine position. After an adequate general endotracheal anesthetic was administered he was prepped and draped in the usual sterile manner exposing both inguinal areas.  A proper timeout was performed identifying the patient and procedure to be performed. We started the repairs on the right side with the procedure being reflected on the left side almost identically except for the large size of the right indirect inguinal hernia.  A transverse incision approximately 6-7 cm long was made with #10 blade then we dissected down through the subcutaneous tissue Scarpa's fascia and then down to the external oblique fascia. We opened up the external oblique fascia through the superficial ring and identified the large incarcerated right inguinal hernia.  We were able to mobilize the hernia and the spermatic cord at the pubic tubercle and rapid with a Penrose drain as we mobilized it externally in order to separate the large indirect sac from the  spermatic cord.  We were able to do so by pulling out the indirect sac from the scrotum and the distal spermatic cord and then separating them using 1 dissection and also electrocautery. Nose minimal bleeding but we were able to successfully remove the entire spermatic cord from the markedly enlarged indirect hernia sac. As we did so that will small bowel contents contained within the peritoneal envelope of the indirect sac. This easily reduced back into the peritoneal cavity. We opened up the sac then directly excised it and then repaired it down towards the peritoneal reflection using a running 0 Ethibond suture. This was done openly looking at the bowel as we made the repair making sure it was not entrapped in the repair.  Once this was done in the excess sac removed we repaired and supported this pantaloon type direct and indirect hernia using an oval piece of polypropylene mesh measuring approximately 4 x 6 cm in size. It was attached to the apex medially using a 0 Prolene suture which Brandon connected to the conjoined tendon anterior medially and the reflected portion of the inguinal ligament inferiorly laterally.  Once we have the mesh in place and it been irrigated with antibiotic solution we excised the excess mesh then closed the external oblique fascia on top the spermatic cord using running 3-0 Vicryl suture. Once this was done we reapproximated Scarpa's fascia using interrupted simple stitches of 3-0 Vicryl the skin on both sides were close using running subcuticular stitch of 4-0 Monocryl. Prior to closure and additional 3 to have cc of quarter percent Marcaine without epinephrine was injected on the right and a total of 7 mL  injected on the left.  Once we completed the repair and the right the repair and the left side was done in almost an identical manner however the piece of mesh was slightly smaller 5 x 3 cm in size. The indirect sac was smaller and did not contain bowel on the left side.  The repair was otherwise identical and the closure completed. Dermabond Steri-Strips and Tegaderms are used to complete the dressings. All needle counts, sponge counts, and instrument counts were correct.  PATIENT DISPOSITION:  PACU - hemodynamically stable.   Juwana Thoreson 10/24/20179:59 AM

## 2016-09-02 NOTE — Transfer of Care (Signed)
Immediate Anesthesia Transfer of Care Note  Patient: [name] LagosJimmy L [name]  Procedure(s) Performed: Procedure(s): OPEN BILATERAL INGUINAL HERNIA REPAIR (Bilateral) INSERTION OF MESH (Right)  Patient Location: PACU  Anesthesia Type:General and Regional  Level of Consciousness: sedated and patient cooperative  Airway & Oxygen Therapy: Patient Spontanous Breathing and Patient connected to nasal cannula oxygen  Post-op Assessment: Report given to RN, Post -op Vital signs reviewed and stable and Patient moving all extremities  Post vital signs: Reviewed and stable  Last Vitals:  Vitals:   09/02/16 0624 09/02/16 1006  BP: 132/66 (!) 142/83  Pulse: (!) 49 (!) 54  Resp: 20 11  Temp: 36.6 C 36.2 C    Last Pain:  Vitals:   09/02/16 1006  TempSrc:   PainSc: Asleep      Patients Stated Pain Goal: 3 (09/02/16 0631)  Complications: No apparent anesthesia complications

## 2016-09-02 NOTE — Anesthesia Procedure Notes (Signed)
Anesthesia Regional Block:  TAP block  Pre-Anesthetic Checklist: ,, timeout performed, Correct Patient, Correct Site, Correct Laterality, Correct Procedure, Correct Position, site marked, Risks and benefits discussed,  Surgical consent,  Pre-op evaluation,  At surgeon's request and post-op pain management  Laterality: Right and Left  Prep: chloraprep       Needles:  Injection technique: Single-shot  Needle Type: Echogenic Needle     Needle Length: 9cm 9 cm Needle Gauge: 21 and 21 G    Additional Needles:  Procedures: ultrasound guided (picture in chart) TAP block Narrative:  Start time: 09/02/2016 7:20 AM End time: 09/02/2016 7:35 AM Injection made incrementally with aspirations every 5 mL.  Performed by: Personally  Anesthesiologist: Marcene DuosFITZGERALD, Mouhamadou Gittleman

## 2016-09-02 NOTE — Anesthesia Postprocedure Evaluation (Signed)
Anesthesia Post Note  Patient: [name]  Procedure(s) Performed: Procedure(s) (LRB): OPEN BILATERAL INGUINAL HERNIA REPAIR (Bilateral) INSERTION OF MESH (Right)  Patient location during evaluation: PACU Anesthesia Type: General and Regional Level of consciousness: awake and alert Pain management: pain level controlled Vital Signs Assessment: post-procedure vital signs reviewed and stable Respiratory status: spontaneous breathing, nonlabored ventilation, respiratory function stable and patient connected to nasal cannula oxygen Cardiovascular status: blood pressure returned to baseline and stable Postop Assessment: no signs of nausea or vomiting Anesthetic complications: no    Last Vitals:  Vitals:   09/02/16 1106 09/02/16 1123  BP: 125/77 127/73  Pulse: (!) 51 (!) 49  Resp: (!) 7 10  Temp:      Last Pain:  Vitals:   09/02/16 1105  TempSrc:   PainSc: 5                  [name], [name]

## 2016-09-02 NOTE — Anesthesia Procedure Notes (Signed)
Procedure Name: Intubation Date/Time: 09/02/2016 7:54 AM Performed by: Izora Gala Pre-anesthesia Checklist: Patient identified, Emergency Drugs available, Suction available and Patient being monitored Patient Re-evaluated:Patient Re-evaluated prior to inductionOxygen Delivery Method: Circle system utilized Preoxygenation: Pre-oxygenation with 100% oxygen Intubation Type: IV induction Ventilation: Mask ventilation without difficulty Laryngoscope Size: Miller and 3 Grade View: Grade II Tube type: Oral Tube size: 7.5 mm Number of attempts: 1 Airway Equipment and Method: Stylet and LTA kit utilized Placement Confirmation: ETT inserted through vocal cords under direct vision,  positive ETCO2 and breath sounds checked- equal and bilateral Secured at: 21 cm Dental Injury: Teeth and Oropharynx as per pre-operative assessment

## 2016-09-02 NOTE — H&P (Signed)
[name] L. [name] 08/19/2016 11:11 AM Location: [facility] Patient #: [id number] DOB: 08/28/1934 Married / Language: English / Race: Asian Male   History of Present Illness [name]([name] O. Kayin Kettering MD; 08/19/2016 11:37 AM) The patient is a 82 year old male who presents with an inguinal hernia. Symptoms include inguinal bulge and inguinal pain. The pain is located in the left inguinal area and in the right inguinal area. The patient describes the pain as dull. Onset was 10 year(s) ago (Previously seen by Dr. in this practice, decided not to have surgery.).   Other Problems ([name], CMA; 08/19/2016 11:11 AM) Cerebrovascular Accident High blood pressure Inguinal Hernia  Past Surgical History ([name]; 08/19/2016 11:11 AM) Vasectomy  Diagnostic Studies History ([name]; 08/19/2016 11:11 AM) Colonoscopy >10 years ago  Allergies ([name], CMA; 08/19/2016 11:12 AM) Baclofen *DERMATOLOGICALS* Penicillins  Medication History ([name], CMA; 08/19/2016 11:14 AM) Metoprolol Succinate ER (50MG  Tablet ER 24HR, Oral) Active. Jalyn (0.5-0.4MG  Capsule, Oral) Active. Toprol XL (50MG  Tablet ER 24HR, Oral) Active. Colace (100MG  Capsule, Oral) Active. Medications Reconciled  Social History ([name]; 08/19/2016 11:11 AM) No alcohol use No caffeine use No drug use Tobacco use Never smoker.  Family History ([name]; 08/19/2016 11:11 AM) First Degree Relatives No pertinent family history    Review of Systems ([name] CMA; 08/19/2016 11:11 AM) General Not Present- Appetite Loss, Chills, Fatigue, Fever, Night Sweats, Weight Gain and Weight Loss. Skin Not Present- Change in Wart/Mole, Dryness, Hives, Jaundice, New Lesions, Non-Healing Wounds, Rash and Ulcer. HEENT Present- Wears glasses/contact lenses. Not Present- Earache, Hearing Loss, Hoarseness, Nose Bleed, Oral Ulcers, Ringing in the Ears, Seasonal Allergies,  Sinus Pain, Sore Throat, Visual Disturbances and Yellow Eyes. Respiratory Not Present- Bloody sputum, Chronic Cough, Difficulty Breathing, Snoring and Wheezing. Breast Not Present- Breast Mass, Breast Pain, Nipple Discharge and Skin Changes. Cardiovascular Not Present- Chest Pain, Difficulty Breathing Lying Down, Leg Cramps, Palpitations, Rapid Heart Rate, Shortness of Breath and Swelling of Extremities. Gastrointestinal Not Present- Abdominal Pain, Bloating, Bloody Stool, Change in Bowel Habits, Chronic diarrhea, Constipation, Difficulty Swallowing, Excessive gas, Gets full quickly at meals, Hemorrhoids, Indigestion, Nausea, Rectal Pain and Vomiting. Male Genitourinary Present- Nocturia. Not Present- Blood in Urine, Change in Urinary Stream, Frequency, Impotence, Painful Urination, Urgency and Urine Leakage. Musculoskeletal Present- Muscle Weakness. Not Present- Back Pain, Joint Pain, Joint Stiffness, Muscle Pain and Swelling of Extremities. Neurological Present- Decreased Memory, Numbness and Trouble walking. Not Present- Fainting, Headaches, Seizures, Tingling, Tremor and Weakness. Psychiatric Not Present- Anxiety, Bipolar, Change in Sleep Pattern, Depression, Fearful and Frequent crying. Endocrine Not Present- Cold Intolerance, Excessive Hunger, Hair Changes, Heat Intolerance, Hot flashes and New Diabetes. Hematology Not Present- Blood Thinners, Easy Bruising, Excessive bleeding, Gland problems, HIV and Persistent Infections.  Vitals ([name] CMA; 08/19/2016 11:15 AM) 08/19/2016 11:14 AM Weight: 135 lb Height: 65in Weight was reported by patient. Height was reported by patient. Body Surface Area: 1.67 m Body Mass Index: 22.46 kg/m  Temp.: 98.31F(Oral)  Pulse: 59 (Regular)  P.OX: 97% (Room air) BP: 120/60 (Sitting, Left Arm, Standard) Today vital signs are stable P 49, BP 132/66   Physical Exam ([name] O. [name] SpruceWyatt MD; 08/19/2016 11:40 AM) General Mental  Status-Alert. General Appearance-Well groomed and Consistent with stated age. Note: Mostly wheelchair bound Orientation-Oriented X4. Build & Nutrition-Lean. Posture-Kyphotic and Stooped posture.  Chest and Lung Exam Chest and lung exam reveals -normal excursion with symmetric chest walls, non-tender and normal tactile fremitus and on auscultation,  normal breath sounds, no adventitious sounds and normal vocal resonance. Lungs:  Clear to auscultation  Cardiovascular Cardiovascular examination reveals -normal heart sounds, regular rate and rhythm with no murmurs. Cor:  No murmurs, gallops lifts or heaves  Abdomen Inspection Hernias - Inguinal hernia - Left - Reducible(Not previously noted or diagnosed. Easily reducible). Right - Reducible(Very large and chronic. Previously noted ten years ago. Minimally symptomatic). Bilateral - Reducible. Hernias still at least partially reducible.   Assessment & Plan [name] O. [name] Lucarelli MD; 08/19/2016 11:43 AM) BILATERAL INGUINAL HERNIA (K40.20) NON-RECURRENT BILATERAL INGUINAL HERNIA WITHOUT OBSTRUCTION OR GANGRENE (K40.20) Impression: Minimally symptomatic, but occassionally with hurt. Not sure if he wants surgery. Will stay in touch and consider when to have surgery. They will call us back  Within a week the patient and the family decided to go ahead with surgery.  Will perform bilateral open inguinal hernia repairs with mesh.  Preoperative antibiotics.  Will place Foley because of enlarged prostate and discontinue tomorrow Am  [name]. Gae Bon, MD, FACS [phone] [facility]

## 2016-09-03 ENCOUNTER — Encounter (HOSPITAL_COMMUNITY): Payer: Self-pay | Admitting: General Surgery

## 2016-09-03 DIAGNOSIS — K4020 Bilateral inguinal hernia, without obstruction or gangrene, not specified as recurrent: Secondary | ICD-10-CM | POA: Diagnosis not present

## 2016-09-03 LAB — CBC
HCT: 36.5 % — ABNORMAL LOW (ref 39.0–52.0)
HEMOGLOBIN: 11.3 g/dL — AB (ref 13.0–17.0)
MCH: 22.7 pg — AB (ref 26.0–34.0)
MCHC: 31 g/dL (ref 30.0–36.0)
MCV: 73.3 fL — ABNORMAL LOW (ref 78.0–100.0)
PLATELETS: 129 10*3/uL — AB (ref 150–400)
RBC: 4.98 MIL/uL (ref 4.22–5.81)
RDW: 13.3 % (ref 11.5–15.5)
WBC: 8.6 10*3/uL (ref 4.0–10.5)

## 2016-09-03 LAB — BASIC METABOLIC PANEL
ANION GAP: 5 (ref 5–15)
BUN: 10 mg/dL (ref 6–20)
CHLORIDE: 107 mmol/L (ref 101–111)
CO2: 27 mmol/L (ref 22–32)
Calcium: 8.5 mg/dL — ABNORMAL LOW (ref 8.9–10.3)
Creatinine, Ser: 0.81 mg/dL (ref 0.61–1.24)
Glucose, Bld: 103 mg/dL — ABNORMAL HIGH (ref 65–99)
POTASSIUM: 4 mmol/L (ref 3.5–5.1)
SODIUM: 139 mmol/L (ref 135–145)

## 2016-09-03 NOTE — Progress Notes (Addendum)
GS Progress Note Subjective: Patient trying to get up and urinate.  Foley removed this AM  Objective: Vital signs in last 24 hours: Temp:  [97.1 F (36.2 C)-98.5 F (36.9 C)] 97.1 F (36.2 C) (10/25 0644) Pulse Rate:  [49-57] 55 (10/25 0520) Resp:  [7-18] 18 (10/25 0520) BP: (100-149)/(45-83) 100/45 (10/25 0520) SpO2:  [98 %-100 %] 98 % (10/25 0520)    Intake/Output from previous day: 10/24 0701 - 10/25 0700 In: 1490 [P.O.:240; I.V.:1250] Out: 2410 [Urine:2400; Blood:10] Intake/Output this shift: Total I/O In: 360 [P.O.:360] Out: -   Lungs: Clear  Abd: Wounds are covered with Tagaderm.  Good bowel sounds.  Minimal swelling of incisions and no scrotal enlargement.  Extremities: No changes.  Neuro: Intact, but maybe a little confused.  Hard to tell from the wife.    Lab Results: CBC   Recent Labs  09/03/16 0507  WBC 8.6  HGB 11.3*  HCT 36.5*  PLT 129*   BMET  Recent Labs  09/03/16 0507  NA 139  K 4.0  CL 107  CO2 27  GLUCOSE 103*  BUN 10  CREATININE 0.81  CALCIUM 8.5*   PT/INR No results for input(s): LABPROT, INR in the last 72 hours. ABG No results for input(s): PHART, HCO3 in the last 72 hours.  Invalid input(s): PCO2, PO2  Studies/Results: No results found.  Anti-infectives: Anti-infectives    Start     Dose/Rate Route Frequency Ordered Stop   09/02/16 1800  ciprofloxacin (CIPRO) IVPB 400 mg     400 mg 200 mL/hr over 60 Minutes Intravenous Every 12 hours 09/02/16 1144 09/02/16 1825   09/02/16 0825  polymyxin B 500,000 Units, bacitracin 50,000 Units in sodium chloride irrigation 0.9 % 500 mL irrigation  Status:  Discontinued       As needed 09/02/16 0825 09/02/16 1000   09/02/16 0520  ciprofloxacin (CIPRO) IVPB 400 mg     400 mg 200 mL/hr over 60 Minutes Intravenous On call to O.R. 09/02/16 0520 09/02/16 0730      Assessment/Plan: s/p Procedure(s): OPEN BILATERAL INGUINAL HERNIA REPAIR INSERTION OF MESH d/c foley Advance  diet Has voided since Foley has come out.     Will decrease IVFs PT/OT  LOS: 0 days    Marta LamasJames O. Gae BonWyatt, III, MD, FACS [phone] [phone]--office [facility] 09/03/2016

## 2016-09-03 NOTE — Evaluation (Addendum)
Physical Therapy Evaluation and D/C Patient Details Name: [name] LagosJimmy L [name] MRN: [id number] DOB: 04/26/1934 Today's Date: 09/03/2016   History of Present Illness  Pt admit for OPEN BILATERAL INGUINAL HERNIA REPAIR  Clinical Impression  Pt admitted with above diagnosis. Pt currently without significant functional limitations and can ambulate with quad cane with supervision with pt at baseline.  Pt family aware that pt may need help with shoes, bathing and dressing and guard assist with ambulation at least initially.  Pt very adamant that he did not need PT assist and he has his own way of mobilizing.  AGree that pt is at baseline and that wife can assist prn when pt is ready to go home.  Will not follow pt as he is at baseline.  Son states that pt and wife are worried he will begin hurting when he goes home and they want to express concern to MD.  Will sign off as pt does not need skilled PT.       Follow Up Recommendations No PT follow up;Supervision/Assistance - 24 hour    Equipment Recommendations  None recommended by PT    Recommendations for Other Services       Precautions / Restrictions Precautions Precautions: Fall Restrictions Weight Bearing Restrictions: No      Mobility  Bed Mobility Overal bed mobility: Modified Independent             General bed mobility comments: pt used rail. Did not want PT to help.    Transfers Overall transfer level: Needs assistance Equipment used: Quad cane Transfers: Sit to/from [employer] CorporationStand;Stand Pivot Transfers Sit to Stand: Supervision Stand pivot transfers: Supervision       General transfer comment: Pt used cane and was able to perform sit to stand on his own each attempt as well as performing stand pivot without assist from PT.  Pt has his own technique and son says"you aren't going to change the way he does it."  Ambulation/Gait Ambulation/Gait assistance: Supervision Ambulation Distance (Feet): 20 Feet (10 feet x 2) Assistive  device: Quad cane Gait Pattern/deviations: Step-to pattern;Decreased step length - right;Decreased weight shift to right;Decreased stance time - right;Decreased dorsiflexion - right;Shuffle;Drifts right/left;Trunk flexed;Wide base of support   Gait velocity interpretation: Below normal speed for age/gender General Gait Details: Pt was able to use quad cane and walk a short distance without PT physical assist.  Pt at baseline per son and wife.    Stairs            Wheelchair Mobility    Modified Rankin (Stroke Patients Only)       Balance Overall balance assessment: Needs assistance Sitting-balance support: No upper extremity supported;Feet supported Sitting balance-Leahy Scale: Fair     Standing balance support: Single extremity supported;During functional activity Standing balance-Leahy Scale: Fair Standing balance comment: pt can stand statically with quad cane without physical assist.              High level balance activites: Direction changes;Turns;Sudden stops High Level Balance Comments: Pt can perform with supervision and quad cane.             Pertinent Vitals/Pain Pain Assessment: 0-10 Pain Score: 2  Pain Location: groin bil Pain Descriptors / Indicators: Sore Pain Intervention(s): Limited activity within patient's tolerance;Monitored during session;Repositioned;Premedicated before session  VSS    Home Living Family/patient expects to be discharged to:: Private residence Living Arrangements: Spouse/significant other Available Help at Discharge: Family;Available 24 hours/day Type of Home: House Home Access: Level entry  Home Layout: One level Home Equipment: Cane - quad;Tub bench;Electric scooter (hoverround) Additional Comments: Pt only ambulates in bathroom from door to toilet or tub    Prior Function Level of Independence: Needs assistance   Gait / Transfers Assistance Needed: pt independent with transfers to hoverround, walked with cane  in bathroom modif I  ADL's / Homemaking Assistance Needed: independent  bathing and dressing baseline with wife supervision for showers.        Hand Dominance   Dominant Hand: Left    Extremity/Trunk Assessment   Upper Extremity Assessment: Defer to OT evaluation           Lower Extremity Assessment: RLE deficits/detail;LLE deficits/detail RLE Deficits / Details: tone noted- flexor, hip,knee and ankle at least 3/5 LLE Deficits / Details: grossly 4-/5  Cervical / Trunk Assessment: Kyphotic  Communication   Communication:  (wife and pt  speaking [place] at times)  Cognition Arousal/Alertness: Awake/alert Behavior During Therapy: WFL for tasks assessed/performed Overall Cognitive Status: Within Functional Limits for tasks assessed                      General Comments      Exercises     Assessment/Plan    PT Assessment Patent does not need any further PT services  PT Problem List Decreased activity tolerance;Decreased balance;Decreased mobility;Decreased safety awareness;Decreased knowledge of use of DME;Decreased knowledge of precautions          PT Treatment Interventions DME instruction;Gait training;Functional mobility training;Therapeutic activities;Therapeutic exercise;Patient/family education    PT Goals (Current goals can be found in the Care Plan section)  Acute Rehab PT Goals PT Goal Formulation: All assessment and education complete, DC therapy    Frequency     Barriers to discharge        Co-evaluation               End of Session Equipment Utilized During Treatment:  (pt refused gait belt adamantly) Activity Tolerance: Patient tolerated treatment well Patient left: with call bell/phone within reach;in chair;with family/visitor present Nurse Communication: Mobility status    Functional Assessment Tool Used: clinical judgment Functional Limitation: Mobility: Walking and moving around Mobility: Walking and Moving Around  Current Status (Z6109): At least 1 percent but less than 20 percent impaired, limited or restricted Mobility: Walking and Moving Around Goal Status [phone]): At least 1 percent but less than 20 percent impaired, limited or restricted Mobility: Walking and Moving Around Discharge Status [phone]): At least 1 percent but less than 20 percent impaired, limited or restricted    Time: 1210-1248 PT Time Calculation (min) (ACUTE ONLY): 38 min   Charges:   PT Evaluation $PT Eval Moderate Complexity: 1 Procedure PT Treatments $Gait Training: 8-22 mins $Self Care/Home Management: 8-22   PT G Codes:   PT G-Codes **NOT FOR INPATIENT CLASS** Functional Assessment Tool Used: clinical judgment Functional Limitation: Mobility: Walking and moving around Mobility: Walking and Moving Around Current Status (B1478): At least 1 percent but less than 20 percent impaired, limited or restricted Mobility: Walking and Moving Around Goal Status [phone]): At least 1 percent but less than 20 percent impaired, limited or restricted Mobility: Walking and Moving Around Discharge Status [phone]): At least 1 percent but less than 20 percent impaired, limited or restricted    Berline Lopes 09/03/2016, 1:58 PM  Anaisabel Pederson,PT Acute Rehabilitation [phone] (pager)

## 2016-09-03 NOTE — Progress Notes (Signed)
OT Cancellation Note  Patient Details Name: [name] LagosJimmy L [name] MRN: [id number] DOB: 01/18/1934   Cancelled Treatment:    Reason Eval/Treat Not Completed: OT screened, no needs identified, will sign off. Discussed pt needs with PT. Per PT, family reports pt functioning at baseline. No OT needs identified. OT will sign off.  [address]Afifa Truax A Ayden Apodaca, OTR/L [phone] 09/03/2016, 12:57 PM

## 2016-09-04 DIAGNOSIS — K4020 Bilateral inguinal hernia, without obstruction or gangrene, not specified as recurrent: Secondary | ICD-10-CM | POA: Diagnosis not present

## 2016-09-04 MED ORDER — OXYCODONE HCL 5 MG PO TABS
5.0000 mg | ORAL_TABLET | ORAL | 0 refills | Status: AC | PRN
Start: 2016-09-04 — End: ?

## 2016-09-04 NOTE — Discharge Summary (Signed)
Physician Discharge Summary  Patient ID: [name] MRN: [id number] DOB/AGE: 80/02/1934 82 y.o.  Admit date: 09/02/2016 Discharge date: 09/04/2016  Admission Diagnoses:Bilateral Inguinal Hernias  Discharge Diagnoses:  Active Problems:   Bilateral inguinal hernia   Discharged Condition: good  Hospital Course: Admitted postoperatively for surveillance after bilateral inguinal hernia repairs.  Did wll postop.  Has some mild genital ecchymosis  Consults: OT/PT  Significant Diagnostic Studies: None  Treatments: IV hydration, antibiotics: Ancef and analgesia: Oxycodone  Discharge Exam: Blood pressure 119/81, pulse 72, temperature 98.1 F (36.7 C), temperature source Oral, resp. rate 18, SpO2 97 %. General appearance: alert, no distress and anxious to go home GI: soft, non-tender; bowel sounds normal; no masses,  no organomegaly and Incision site are clean and dry Male genitalia: abnormal findings: Right hydrocele, Ecchymosis  Disposition: 01-Home or Self Care  Discharge Instructions    Call MD for:  difficulty breathing, headache or visual disturbances    Complete by:  As directed    Call MD for:  extreme fatigue    Complete by:  As directed    Call MD for:  hives    Complete by:  As directed    Call MD for:  persistant dizziness or light-headedness    Complete by:  As directed    Call MD for:  persistant nausea and vomiting    Complete by:  As directed    Call MD for:  redness, tenderness, or signs of infection (pain, swelling, redness, odor or green/yellow discharge around incision site)    Complete by:  As directed    Call MD for:  severe uncontrolled pain    Complete by:  As directed    Diet - low sodium heart healthy    Complete by:  As directed    Discharge instructions    Complete by:  As directed    See postoperative hernia instructions   Increase activity slowly    Complete by:  As directed    Lifting restrictions    Complete by:  As directed    No  lifting greater than 20 pounds for the next 6 weeks       Medication List    STOP taking these medications   ibuprofen 200 MG tablet Commonly known as:  ADVIL,MOTRIN     TAKE these medications   Dutasteride-Tamsulosin HCl 0.5-0.4 MG Caps Take 1 capsule by mouth daily. What changed:  additional instructions   ketotifen 0.025 % ophthalmic solution Commonly known as:  ZADITOR Place 1 drop into both eyes 2 (two) times daily as needed (for dry/irritated eyes.).   oxyCODONE 5 MG immediate release tablet Commonly known as:  Oxy IR/ROXICODONE Take 1 tablet (5 mg total) by mouth every 4 (four) hours as needed for moderate pain.   TOPROL XL 50 MG 24 hr tablet Generic drug:  metoprolol succinate Take 50 mg by mouth daily.      Follow-up Information    Jimmye NormanJAMES Duff Pozzi, MD. Schedule an appointment as soon as possible for a visit in 2 week(s).   Specialty:  General Surgery Contact information: [address] [place] [address] [phone]           Signed: Jimmye NormanJAMES Elma Shands 09/04/2016, 7:47 AM

## 2016-09-04 NOTE — Progress Notes (Signed)
Patient discharged to home with instructions given to his son.

## 2016-09-04 NOTE — Discharge Instructions (Signed)
Open Hernia Repair, Care After Refer to this sheet in the next few weeks. These instructions provide you with information on caring for yourself after your procedure. Your health care provider may also give you more specific instructions. Your treatment has been planned according to current medical practices, but problems sometimes occur. Call your health care provider if you have any problems or questions after your procedure. WHAT TO EXPECT AFTER THE PROCEDURE After your procedure, it is typical to have the following:  Pain in your abdomen, especially along your incision. You will be given pain medicines to control the pain.  Constipation. You may be given a stool softener to help prevent this. HOME CARE INSTRUCTIONS  Only take over-the-counter or prescription medicines as directed by your health care provider.  Keep the incision area dry and clean. You may wash the incision area gently with soap and water 48 hours after surgery. Gently blot or dab the incision area dry. Do not take baths, use swimming pools, or use hot tubs for 10 days or until your health care provider approves.  Keep dressing intact until seen  In clinic  May shower and pat dry.  Plastic dressing will protect the wound  Continue your normal diet as directed by your health care provider. Eat plenty of fruits and vegetables to help prevent constipation.  Drink enough fluids to keep your urine clear or pale yellow. This also helps prevent constipation.  Do not drive until your health care provider says it is okay.  Do not lift anything heavier than 10 lb (4.5 kg) or play contact sports for 4 weeks or until your health care provider approves.  Follow up with your health care provider as directed. Ask your health care provider when to make an appointment to have your stitches (sutures) or staples removed. SEEK MEDICAL CARE IF:  You have increased bleeding coming from the incision site.  You have blood in your  stool.  You have increasing pain in the incision area.  You see redness or swelling in the incision area.  You have fluid (pus) coming from the incision.  You have a fever.  You notice a bad smell coming from the incision area or dressing. SEEK IMMEDIATE MEDICAL CARE IF:  You develop a rash.  You have chest pain or shortness of breath.  You feel lightheaded or feel faint.   This information is not intended to replace advice given to you by your health care provider. Make sure you discuss any questions you have with your health care provider.

## 2016-09-09 DIAGNOSIS — I10 Essential (primary) hypertension: Secondary | ICD-10-CM | POA: Diagnosis not present

## 2016-09-09 DIAGNOSIS — J309 Allergic rhinitis, unspecified: Secondary | ICD-10-CM | POA: Diagnosis not present

## 2016-09-09 DIAGNOSIS — Z8673 Personal history of transient ischemic attack (TIA), and cerebral infarction without residual deficits: Secondary | ICD-10-CM | POA: Diagnosis not present

## 2016-09-09 DIAGNOSIS — R7301 Impaired fasting glucose: Secondary | ICD-10-CM | POA: Diagnosis not present

## 2016-09-09 DIAGNOSIS — N4889 Other specified disorders of penis: Secondary | ICD-10-CM | POA: Diagnosis not present

## 2016-09-09 DIAGNOSIS — D649 Anemia, unspecified: Secondary | ICD-10-CM | POA: Diagnosis not present

## 2016-09-09 DIAGNOSIS — E785 Hyperlipidemia, unspecified: Secondary | ICD-10-CM | POA: Diagnosis not present

## 2016-09-09 DIAGNOSIS — D696 Thrombocytopenia, unspecified: Secondary | ICD-10-CM | POA: Diagnosis not present

## 2016-09-09 DIAGNOSIS — N400 Enlarged prostate without lower urinary tract symptoms: Secondary | ICD-10-CM | POA: Diagnosis not present

## 2016-10-13 MED FILL — DUTASTERIDE-TAMSULOSIN 0.5-: 0.5-0.4 | 90 days supply | Qty: 90 | Fill #0

## 2017-01-08 MED FILL — DUTASTERIDE-TAMSULOSIN 0.5-: 0.5-0.4 | 90 days supply | Qty: 90 | Fill #1

## 2017-02-12 IMAGING — CT CT HIP*R* W/O CM
2 series · 15 of 36 positions shown, 19 images · non-contrast
Comparison: Right hip radiographs dated 02/09/2015

CLINICAL DATA: Fall, right hip pain

EXAM:
CT OF THE RIGHT HIP WITHOUT CONTRAST
TECHNIQUE: Multidetector CT imaging of the right hip was performed according to
the standard protocol. Multiplanar CT image reconstructions were
also generated.

[Series 3: pelvis 3.0 i40f 1 · axial · 0.42mm/px · z∈[-406,-256]mm · 14 of 56 slices shown, 17 images]
[im 4/56  soft-tissue]
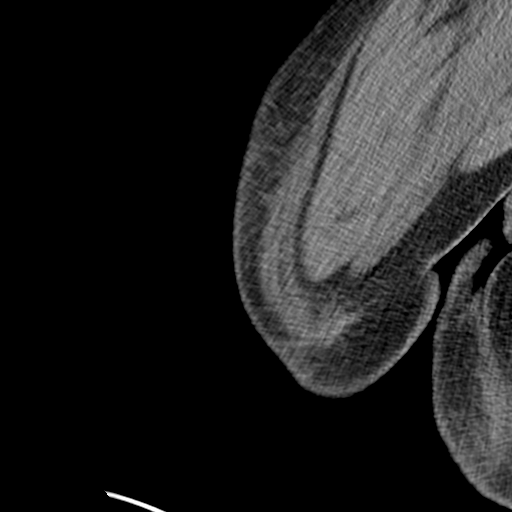
[im 4/56  bone]
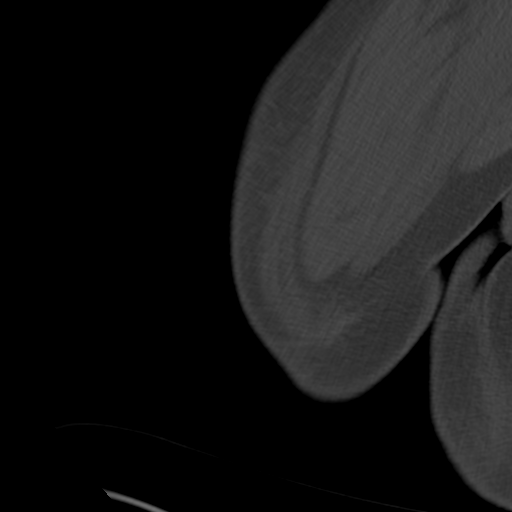
[im 9/56  soft-tissue]
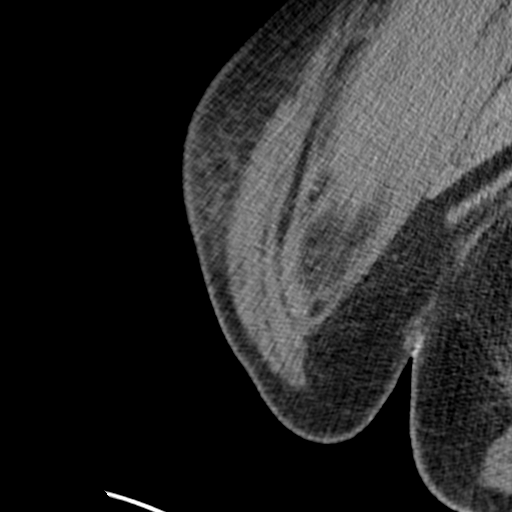
[im 13/56  soft-tissue]
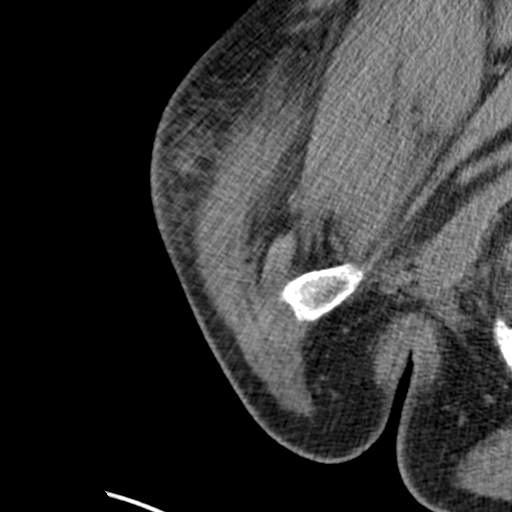
[im 18/56  soft-tissue]
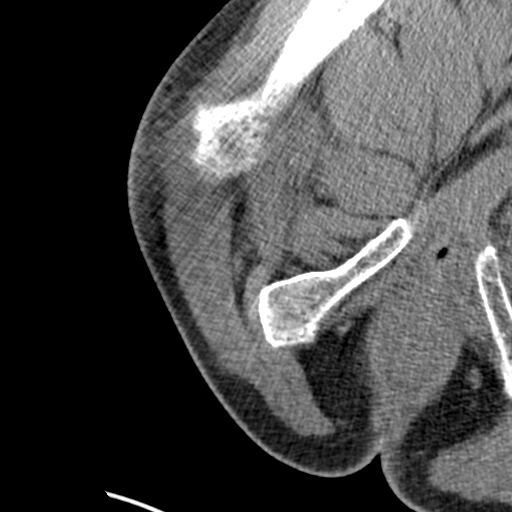
[im 24/56  soft-tissue]
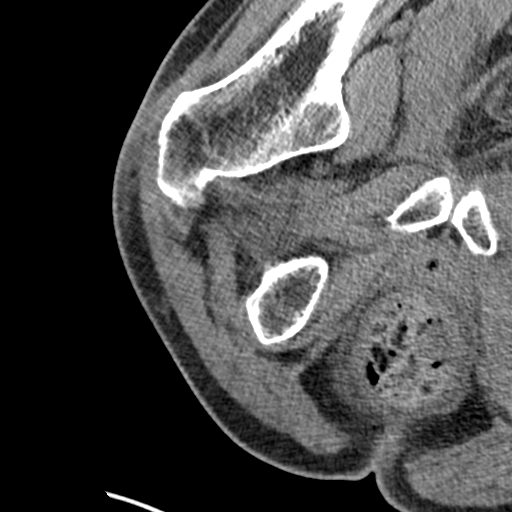
[im 29/56  soft-tissue]
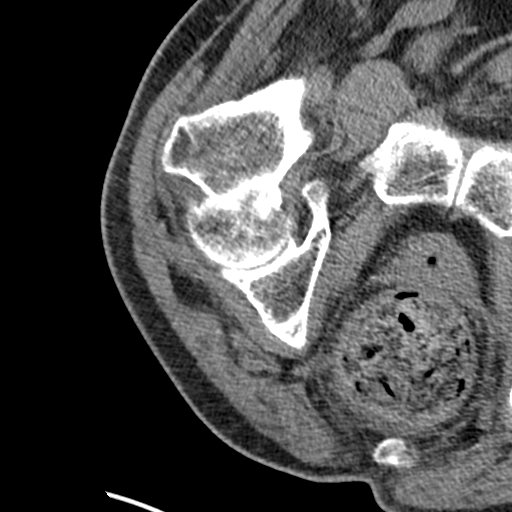
[im 32/56  soft-tissue]
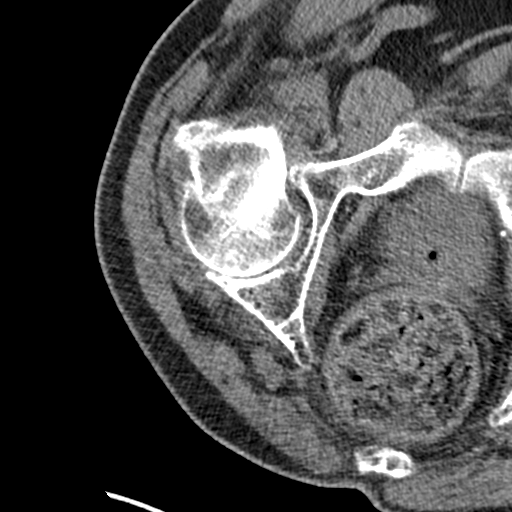
[im 38/56  soft-tissue]
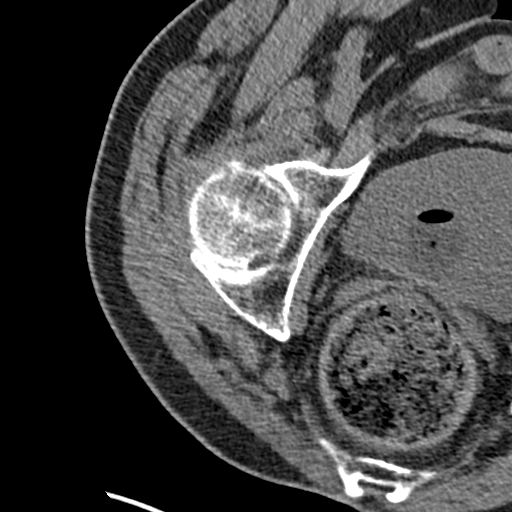
[im 43/56  soft-tissue]
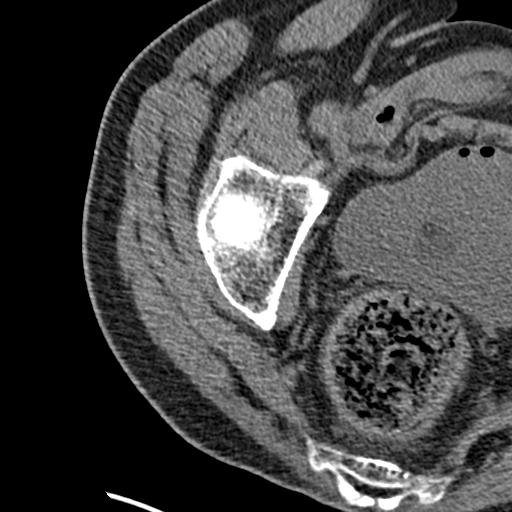
[im 43/56  bone]
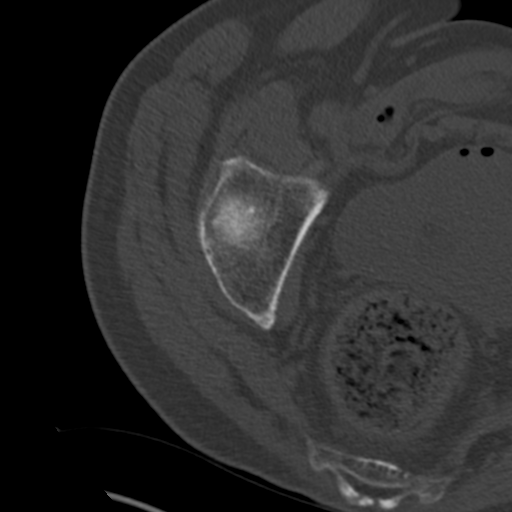
[im 47/56  soft-tissue]
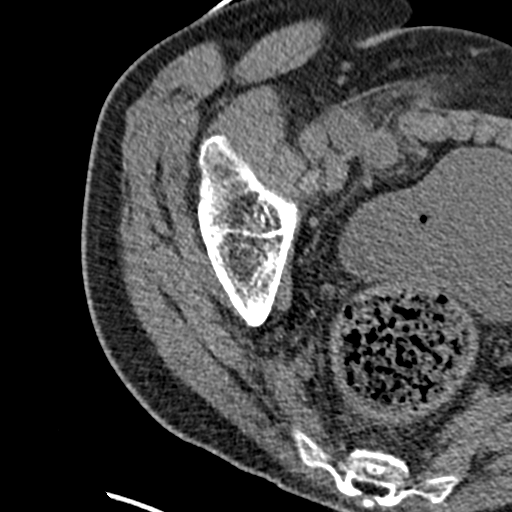
[im 48/56  lung]
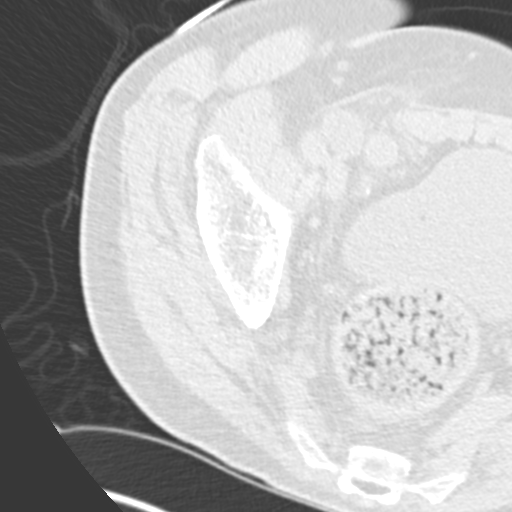
[im 50/56  lung]
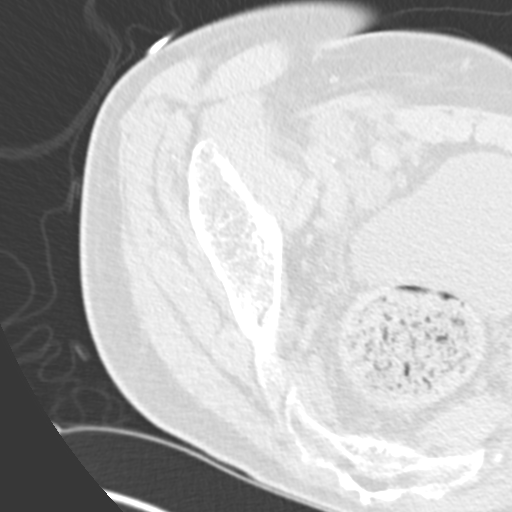
[im 52/56  soft-tissue]
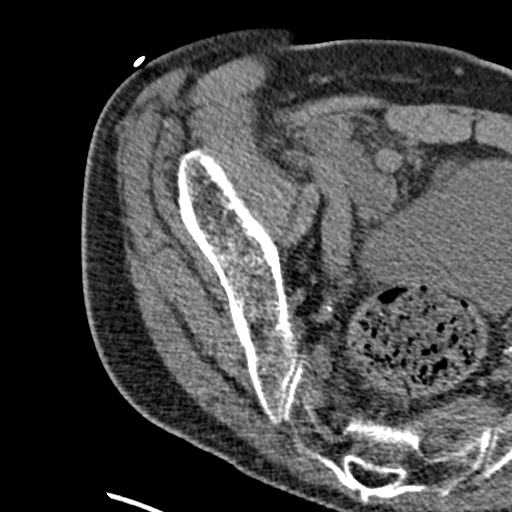
[im 52/56  lung]
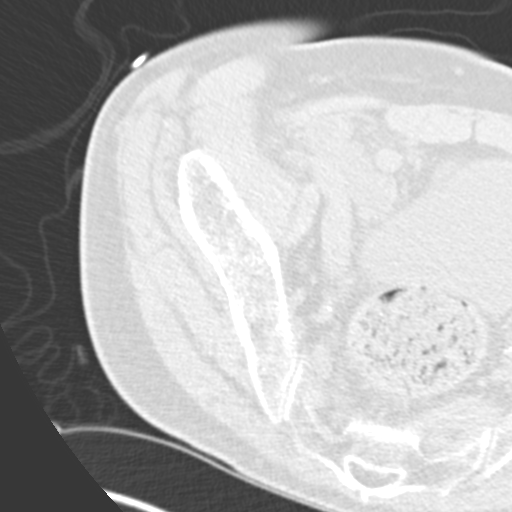
[im 54/56  lung]
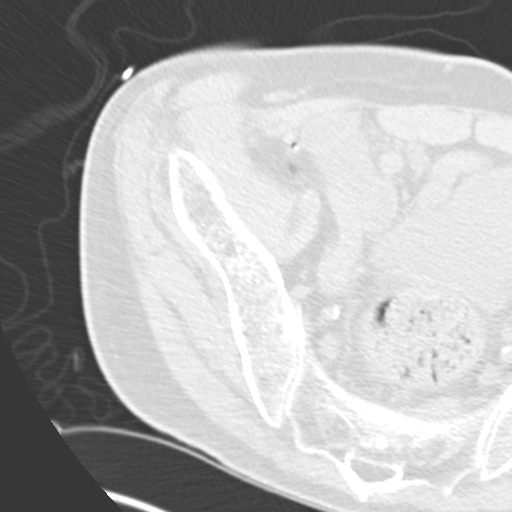

[Series 7: sagittal · sagittal · 0.36mm/px · 1 of 55 slices shown, 2 images]
[im 19/55  soft-tissue]
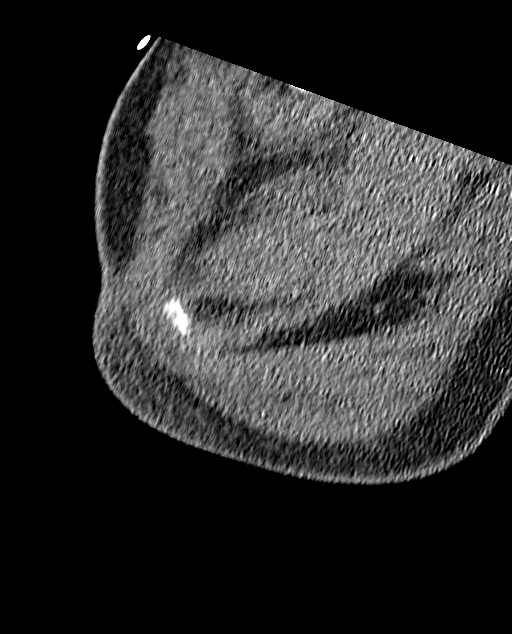
[im 19/55  bone]
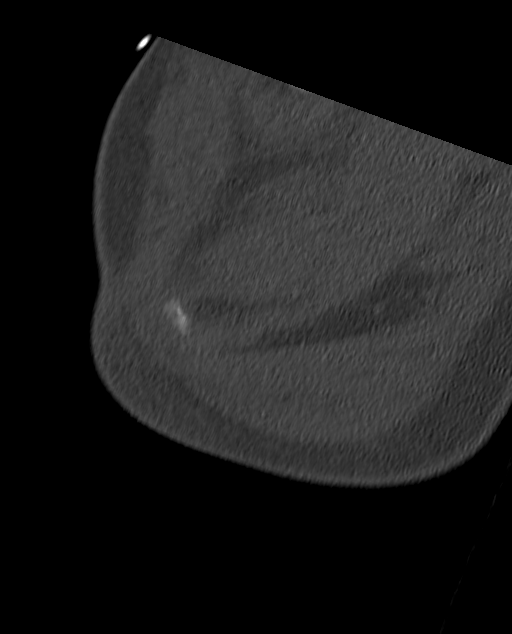

[15 of 36 positions shown; findings below may reference images not displayed]

FINDINGS: Subcapital right femoral neck fracture (series 6/image 15),
impacted/foreshortened (series 6/image 20).

Visualized bony pelvis appears intact.

Right inguinal/scrotal hernia containing fat and small bowel (series
3/ image 13), incompletely visualized.

Foley catheter in the bladder.

Moderate stool in the rectum.
IMPRESSION: Subcapital right femoral neck fracture, impacted/foreshortened.

## 2017-02-18 IMAGING — DX DG CHEST 2V
2 series · 2 of 2 positions shown · non-contrast
Comparison: February 10, 2015

CLINICAL DATA: Fever

EXAM:
CHEST  2 VIEW

[chest lat]
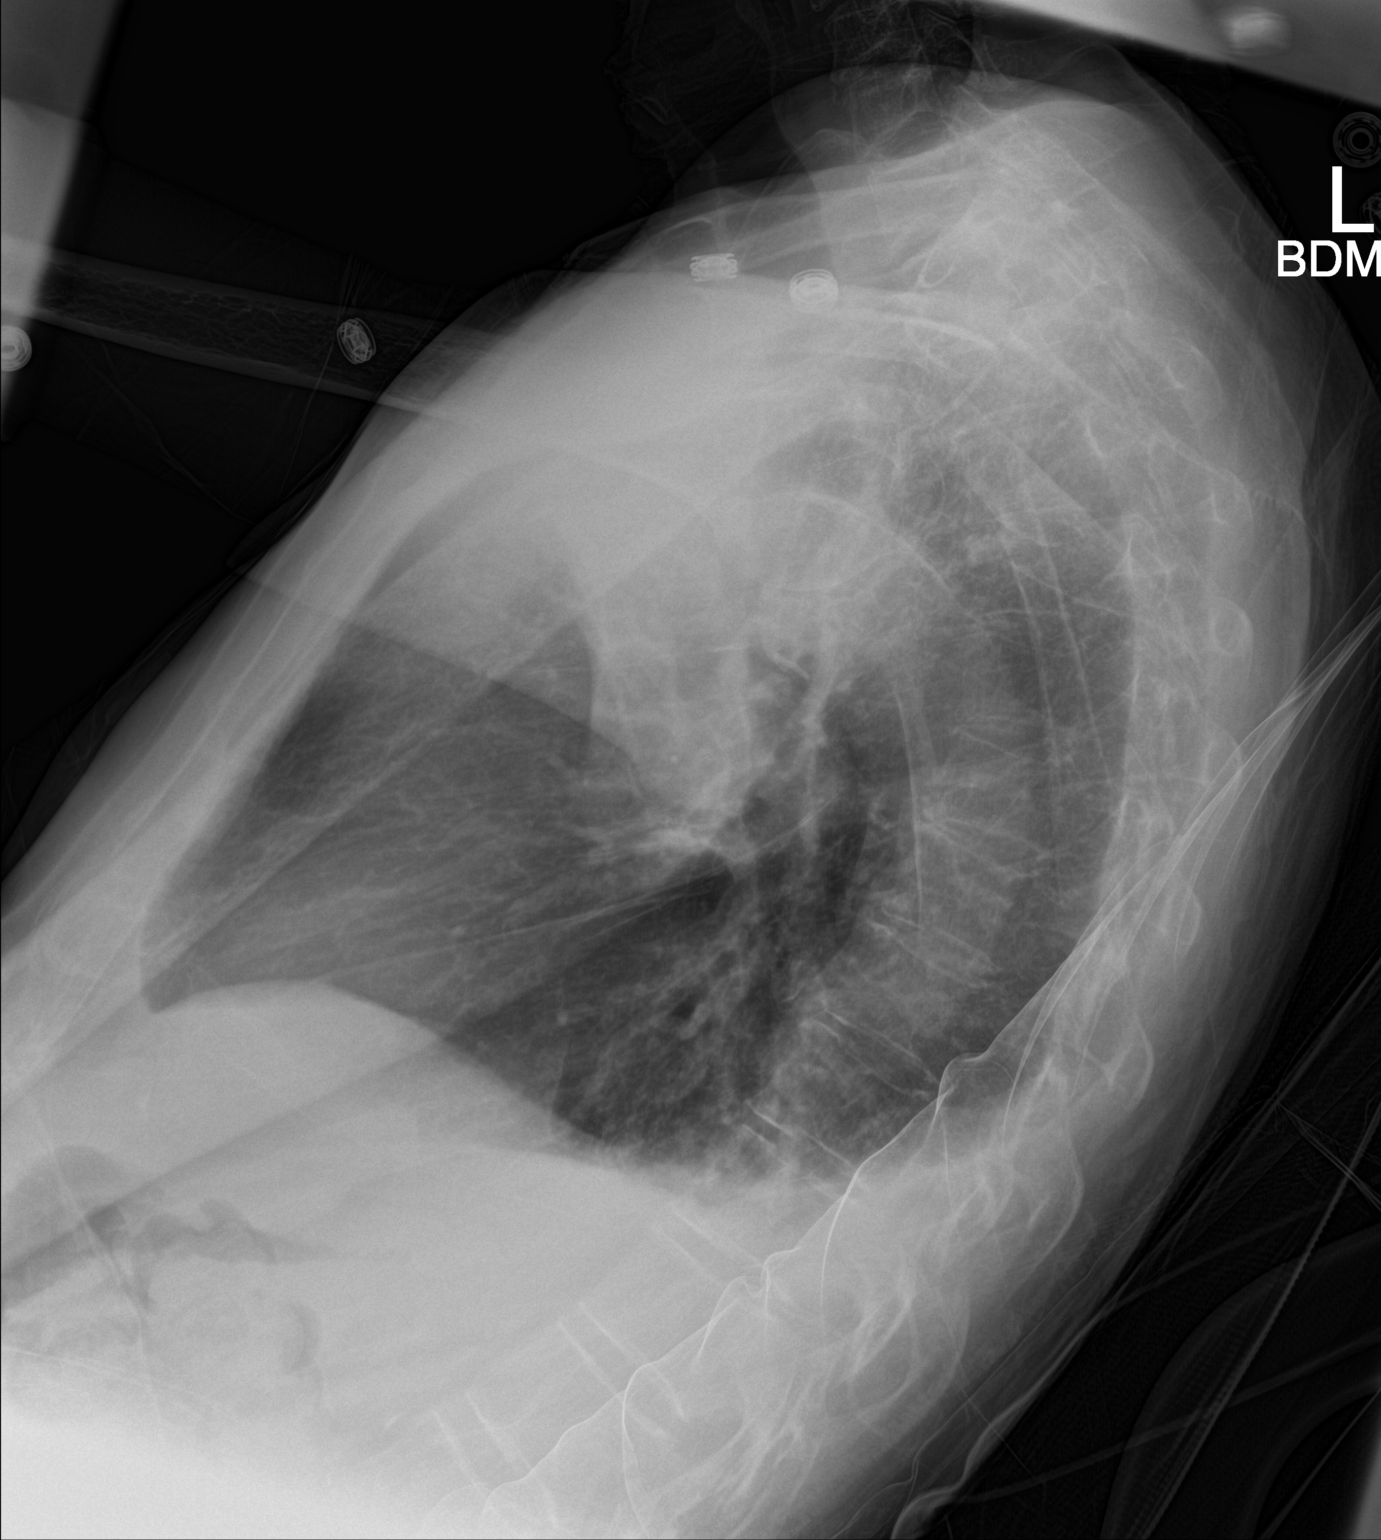

[chest ap]
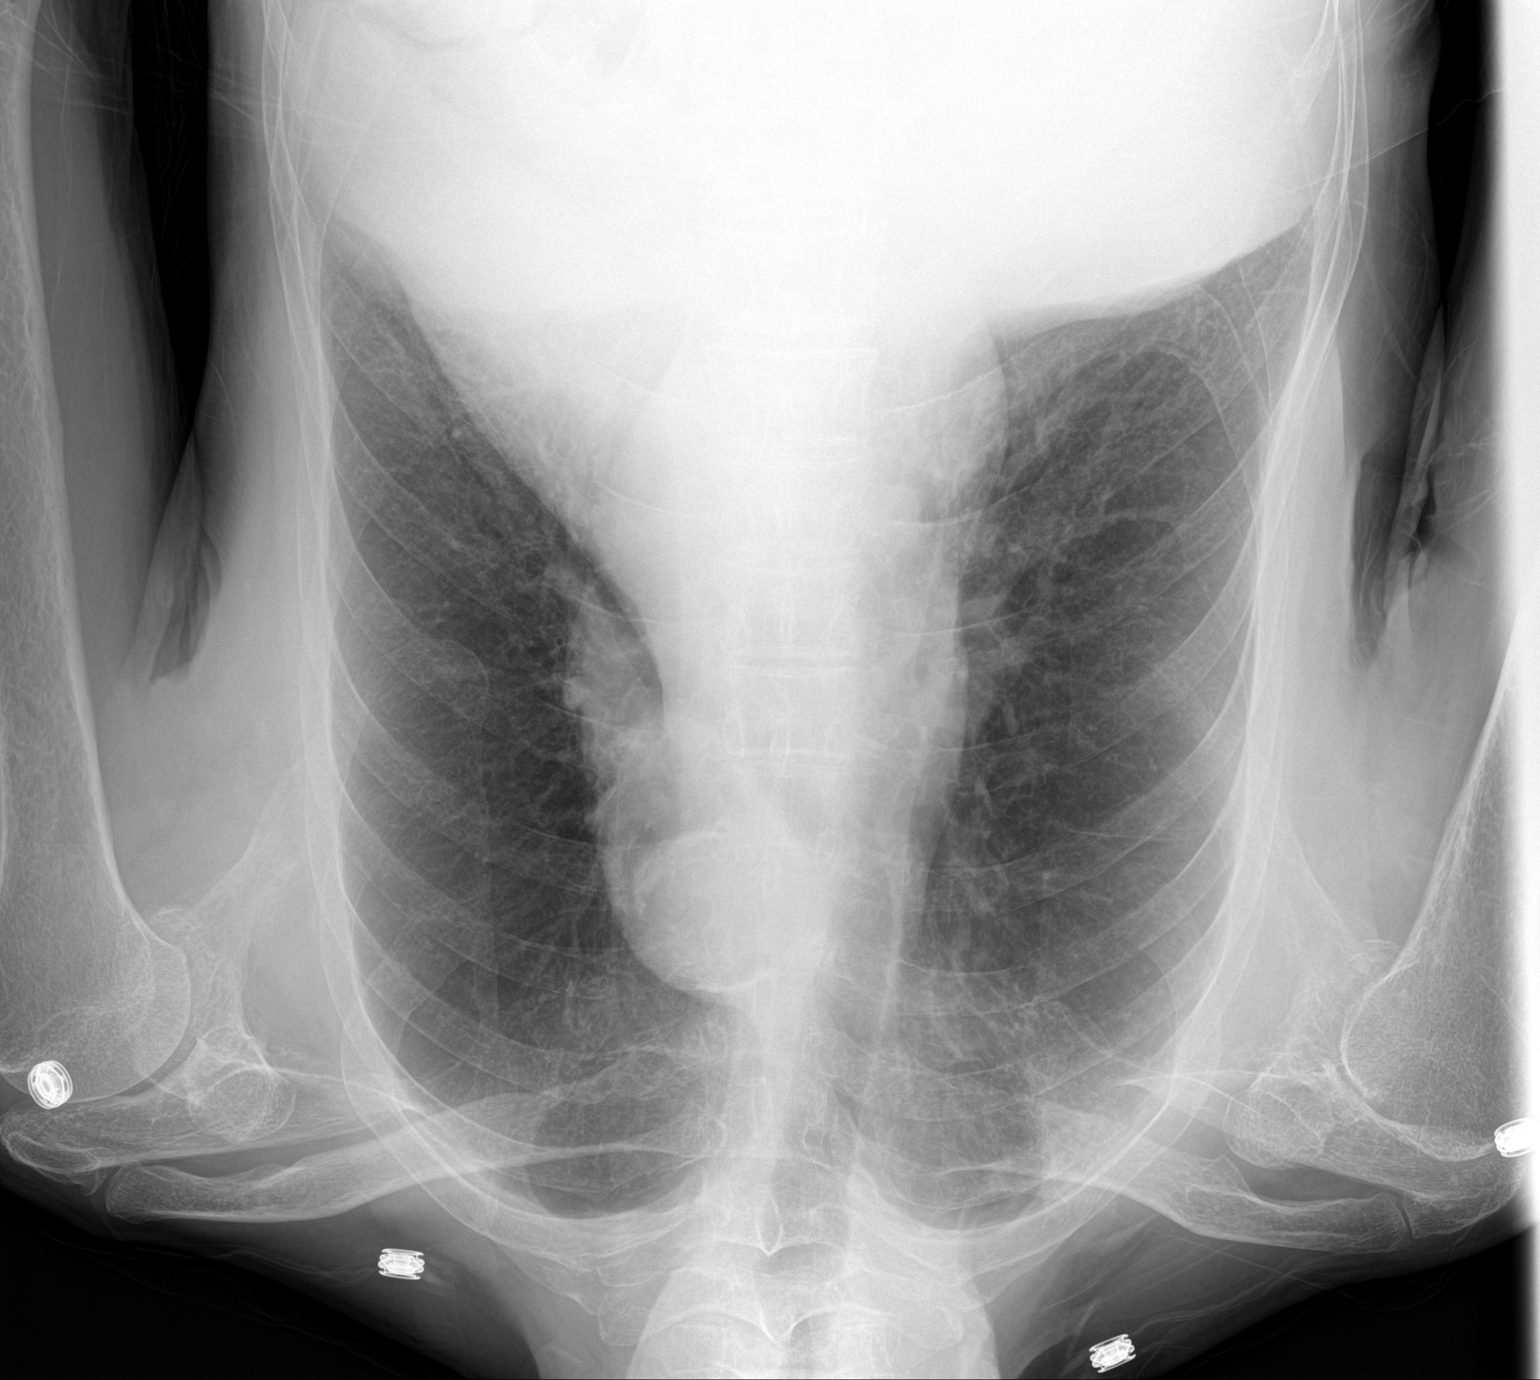

[2 of 2 positions shown; findings below may reference images not displayed]

FINDINGS: There is a small left pleural effusion. There is no edema or
consolidation. Heart is upper normal in size with pulmonary
vascularity within normal limits. There is atherosclerotic change in
aorta. The aorta is mildly prominent. No adenopathy. No bone
lesions.
IMPRESSION: Small left pleural effusion. No edema or consolidation. Prominence
of the aorta suggests chronic hypertensive change.

## 2017-03-12 DIAGNOSIS — E7800 Pure hypercholesterolemia, unspecified: Secondary | ICD-10-CM | POA: Diagnosis not present

## 2017-03-12 DIAGNOSIS — R7303 Prediabetes: Secondary | ICD-10-CM | POA: Diagnosis not present

## 2017-03-12 DIAGNOSIS — N400 Enlarged prostate without lower urinary tract symptoms: Secondary | ICD-10-CM | POA: Diagnosis not present

## 2017-03-12 DIAGNOSIS — Z8673 Personal history of transient ischemic attack (TIA), and cerebral infarction without residual deficits: Secondary | ICD-10-CM | POA: Diagnosis not present

## 2017-03-12 DIAGNOSIS — J309 Allergic rhinitis, unspecified: Secondary | ICD-10-CM | POA: Diagnosis not present

## 2017-03-12 DIAGNOSIS — I10 Essential (primary) hypertension: Secondary | ICD-10-CM | POA: Diagnosis not present

## 2017-03-12 DIAGNOSIS — D649 Anemia, unspecified: Secondary | ICD-10-CM | POA: Diagnosis not present

## 2017-03-12 DIAGNOSIS — I6930 Unspecified sequelae of cerebral infarction: Secondary | ICD-10-CM | POA: Diagnosis not present

## 2017-03-12 DIAGNOSIS — D696 Thrombocytopenia, unspecified: Secondary | ICD-10-CM | POA: Diagnosis not present

## 2017-04-07 MED FILL — DUTASTERIDE-TAMSULOSIN 0.5-: 0.5-0.4 | 90 days supply | Qty: 90 | Fill #2

## 2017-06-05 ENCOUNTER — Encounter (HOSPITAL_COMMUNITY): Payer: Self-pay

## 2017-06-05 ENCOUNTER — Emergency Department (HOSPITAL_COMMUNITY): Payer: PPO

## 2017-06-05 ENCOUNTER — Inpatient Hospital Stay (HOSPITAL_COMMUNITY)
Admission: EM | Admit: 2017-06-05 | Discharge: 2017-06-10 | DRG: 066 | Disposition: E | Payer: PPO | Attending: Internal Medicine | Admitting: Internal Medicine

## 2017-06-05 DIAGNOSIS — R55 Syncope and collapse: Secondary | ICD-10-CM | POA: Diagnosis not present

## 2017-06-05 DIAGNOSIS — G935 Compression of brain: Secondary | ICD-10-CM | POA: Diagnosis present

## 2017-06-05 DIAGNOSIS — R29740 NIHSS score 40: Secondary | ICD-10-CM | POA: Diagnosis present

## 2017-06-05 DIAGNOSIS — Z4682 Encounter for fitting and adjustment of non-vascular catheter: Secondary | ICD-10-CM | POA: Diagnosis not present

## 2017-06-05 DIAGNOSIS — Z8249 Family history of ischemic heart disease and other diseases of the circulatory system: Secondary | ICD-10-CM | POA: Diagnosis not present

## 2017-06-05 DIAGNOSIS — I619 Nontraumatic intracerebral hemorrhage, unspecified: Secondary | ICD-10-CM | POA: Diagnosis not present

## 2017-06-05 DIAGNOSIS — Z515 Encounter for palliative care: Secondary | ICD-10-CM | POA: Diagnosis present

## 2017-06-05 DIAGNOSIS — Z880 Allergy status to penicillin: Secondary | ICD-10-CM | POA: Diagnosis not present

## 2017-06-05 DIAGNOSIS — D509 Iron deficiency anemia, unspecified: Secondary | ICD-10-CM | POA: Diagnosis present

## 2017-06-05 DIAGNOSIS — R404 Transient alteration of awareness: Secondary | ICD-10-CM | POA: Diagnosis not present

## 2017-06-05 DIAGNOSIS — Z66 Do not resuscitate: Secondary | ICD-10-CM | POA: Diagnosis present

## 2017-06-05 DIAGNOSIS — I629 Nontraumatic intracranial hemorrhage, unspecified: Secondary | ICD-10-CM | POA: Diagnosis not present

## 2017-06-05 DIAGNOSIS — I10 Essential (primary) hypertension: Secondary | ICD-10-CM | POA: Diagnosis present

## 2017-06-05 DIAGNOSIS — N400 Enlarged prostate without lower urinary tract symptoms: Secondary | ICD-10-CM | POA: Diagnosis present

## 2017-06-05 DIAGNOSIS — I615 Nontraumatic intracerebral hemorrhage, intraventricular: Principal | ICD-10-CM | POA: Diagnosis present

## 2017-06-05 DIAGNOSIS — Z79899 Other long term (current) drug therapy: Secondary | ICD-10-CM | POA: Diagnosis not present

## 2017-06-05 DIAGNOSIS — Z8673 Personal history of transient ischemic attack (TIA), and cerebral infarction without residual deficits: Secondary | ICD-10-CM

## 2017-06-05 DIAGNOSIS — Z8611 Personal history of tuberculosis: Secondary | ICD-10-CM

## 2017-06-05 DIAGNOSIS — Z993 Dependence on wheelchair: Secondary | ICD-10-CM

## 2017-06-05 LAB — I-STAT CHEM 8, ED
BUN: 16 mg/dL (ref 6–20)
Calcium, Ion: 1.06 mmol/L — ABNORMAL LOW (ref 1.15–1.40)
Chloride: 99 mmol/L — ABNORMAL LOW (ref 101–111)
Creatinine, Ser: 0.7 mg/dL (ref 0.61–1.24)
Glucose, Bld: 147 mg/dL — ABNORMAL HIGH (ref 65–99)
HEMATOCRIT: 35 % — AB (ref 39.0–52.0)
HEMOGLOBIN: 11.9 g/dL — AB (ref 13.0–17.0)
POTASSIUM: 3.6 mmol/L (ref 3.5–5.1)
Sodium: 136 mmol/L (ref 135–145)
TCO2: 24 mmol/L (ref 0–100)

## 2017-06-05 LAB — COMPREHENSIVE METABOLIC PANEL
ALBUMIN: 3.7 g/dL (ref 3.5–5.0)
ALT: 14 U/L — AB (ref 17–63)
AST: 37 U/L (ref 15–41)
Alkaline Phosphatase: 48 U/L (ref 38–126)
Anion gap: 11 (ref 5–15)
BUN: 14 mg/dL (ref 6–20)
CHLORIDE: 100 mmol/L — AB (ref 101–111)
CO2: 22 mmol/L (ref 22–32)
CREATININE: 0.85 mg/dL (ref 0.61–1.24)
Calcium: 8.3 mg/dL — ABNORMAL LOW (ref 8.9–10.3)
GFR calc Af Amer: >60 mL/min (ref >60)
GFR calc non Af Amer: >60 mL/min (ref >60)
GLUCOSE: 149 mg/dL — AB (ref 65–99)
POTASSIUM: 3.6 mmol/L (ref 3.5–5.1)
SODIUM: 133 mmol/L — AB (ref 135–145)
Total Bilirubin: 0.5 mg/dL (ref 0.3–1.2)
Total Protein: 6.6 g/dL (ref 6.5–8.1)

## 2017-06-05 LAB — I-STAT VENOUS BLOOD GAS, ED
Bicarbonate: 24 mmol/L (ref 20.0–28.0)
O2 Saturation: 100 %
PCO2 VEN: 34.4 mmHg — AB (ref 44.0–60.0)
TCO2: 25 mmol/L (ref 0–100)
pH, Ven: 7.453 — ABNORMAL HIGH (ref 7.250–7.430)
pO2, Ven: 179 mmHg — ABNORMAL HIGH (ref 32.0–45.0)

## 2017-06-05 LAB — I-STAT CG4 LACTIC ACID, ED: LACTIC ACID, VENOUS: 3.36 mmol/L — AB (ref 0.5–1.9)

## 2017-06-05 LAB — I-STAT TROPONIN, ED: Troponin i, poc: 0.03 ng/mL (ref 0.00–0.08)

## 2017-06-05 LAB — POC OCCULT BLOOD, ED: FECAL OCCULT BLD: POSITIVE — AB

## 2017-06-05 LAB — GAMMA GT: GGT: 9 U/L (ref 7–50)

## 2017-06-05 LAB — LIPASE, BLOOD: LIPASE: 32 U/L (ref 11–51)

## 2017-06-05 MED ORDER — SODIUM CHLORIDE 0.9 % IV BOLUS (SEPSIS)
1000.0000 mL | Freq: Once | INTRAVENOUS | Status: AC
  Administered 2017-06-05: 1000 mL via INTRAVENOUS

## 2017-06-05 NOTE — Progress Notes (Signed)
Scan reviewed. Per the patients exam noted by the ED physician and the results of the scan, this is an insurvivable hemorrhage and all treatment would be futile. We recommend comfort measures only at this time.
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
## 2017-06-05 NOTE — ED Notes (Signed)
Called CT and they are ready
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
## 2017-06-05 NOTE — ED Provider Notes (Signed)
[facility]-EMERGENCY DEPT Provider Note   CSN: [id number] Arrival date & time: June 06, 2017  2258     History   Chief Complaint Chief Complaint  Patient presents with  . Altered Mental Status    HPI [name] is a 83 y.o. male.  HPI  83 yo M with PMHx CVA x 2, hip fx, here with AMS. Pt was reportedly at his baseline state of health until just prior to arrival. Pt reportedly was with his wife, was talking then went quiet for several minutes. Pt was then noted to cough, then had a small amount of blood in his mouth. EMS was called. On EMS arrival, pt had blown pupils, unresponsive but with regular HR. He was intubated and brought to the ED. Initially there was report of seizure like activity, but on further discussion with wife from son, there were no convulsions.  Level 5 caveat invoked as remainder of history, ROS, and physical exam limited due to patient's unresponsiveness.   Past Medical History:  Diagnosis Date  . Enlarged prostate   . Hypertension   . Inguinal hernia   . Stroke [facility]) 2004   2000- bleed- right sided weakness  . Tuberculosis 1990's    Patient Active Problem List   Diagnosis Date Noted  . Intracranial bleeding (HCC) 05/28/2017  . Bilateral inguinal hernia 09/02/2016  . Femoral neck fracture (HCC) 02/13/2015  . Acute respiratory failure with hypoxia (HCC) 02/11/2015  . HCAP (healthcare-associated pneumonia) 02/11/2015  . Sepsis (HCC) 02/11/2015  . Microcytic anemia 02/10/2015  . Thrombocytopenia (HCC) 02/10/2015  . Cerebral infarction due to other mechanism (HCC)   . Inguinal hernia 02/09/2015  . Fracture of femoral neck, right (HCC) 02/09/2015  . Closed right hip fracture (HCC) 02/09/2015  . Fractured hip (HCC) 02/09/2015  . Tuberculosis of lung, infiltrative 04/26/2009  . Essential hypertension 04/26/2009  . Right spastic hemiparesis (HCC) 04/26/2009  . OVERACTIVE BLADDER 04/26/2009  . Anorexia 04/26/2009    Past Surgical History:  Procedure  Laterality Date  . BRAIN SURGERY  2000   bleed  . FRACTURE SURGERY    . HIP PINNING,CANNULATED Right 02/10/2015   Procedure: CANNULATED HIP PINNING;  Surgeon: Sheral Apley, MD;  Location: [facility] OR;  Service: Orthopedics;  Laterality: Right;  . INGUINAL HERNIA REPAIR Bilateral 09/02/2016   open  . INGUINAL HERNIA REPAIR Bilateral 09/02/2016   Procedure: OPEN BILATERAL INGUINAL HERNIA REPAIR;  Surgeon: Jimmye Norman, MD;  Location: [facility] OR;  Service: General;  Laterality: Bilateral;  . INSERTION OF MESH Right 09/02/2016   Procedure: INSERTION OF MESH;  Surgeon: Jimmye Norman, MD;  Location: [facility] OR;  Service: General;  Laterality: Right;       Home Medications    Prior to Admission medications   Medication Sig Start Date End Date Taking? Authorizing Provider  Dutasteride-Tamsulosin HCl 0.5-0.4 MG CAPS Take 1 capsule by mouth daily. 02/22/15  Yes Angiulli, Mcarthur Rossetti, PA-C  ketotifen (ZADITOR) 0.025 % ophthalmic solution Place 1 drop into both eyes 2 (two) times daily as needed (for dry/irritated eyes.).   Yes [provider]  oxyCODONE (OXY IR/ROXICODONE) 5 MG immediate release tablet Take 1 tablet (5 mg total) by mouth every 4 (four) hours as needed for moderate pain. 09/04/16  Yes Jimmye Norman, MD  TOPROL XL 50 MG 24 hr tablet Take 50 mg by mouth daily. 05/03/15  Yes [provider]    Family History History reviewed. No pertinent family history.  Social History Social History  Substance Use Topics  .  Smoking status: Never Smoker  . Smokeless tobacco: Never Used  . Alcohol use No     Allergies   Baclofen and Penicillins   Review of Systems Review of Systems  Unable to perform ROS: Mental status change     Physical Exam Updated Vital Signs BP 107/65 (BP Location: Right Arm)   Pulse 93   Temp 98.6 F (37 C)   Resp 14   Ht 5\' 9"  (1.753 m)   Wt 63.5 kg (140 lb)   SpO2 (!) 89%   BMI 20.67 kg/m   Physical Exam  Constitutional:  Intubated, without  sedation. No spontaneous movement.  HENT:  Head: Normocephalic and atraumatic.  Absent gag reflex  Eyes:  Pupils 6 mm, round, non-reactive bilaterally. Abnormal oculocephalic reflex.  Neck: Neck supple.  Cardiovascular: Normal rate, regular rhythm and normal heart sounds.  Exam reveals no friction rub.   No murmur heard. Pulmonary/Chest:  Erratic spontaneous respirations noted. Intubated.  Abdominal: Soft. He exhibits no distension.  Musculoskeletal: He exhibits no edema.  Neurological:  Cough reflex, otherwise no apparent brainstem reflexes. Absent corneal reflex. No gag. Pupils blown bilaterally. No movement of b/l UE or LE. No response to noxious stimuli  Skin: Capillary refill takes less than 2 seconds. No rash noted. There is pallor.  Psychiatric: He has a normal mood and affect.  Nursing note and vitals reviewed.    ED Treatments / Results  Labs (all labs ordered are listed, but only abnormal results are displayed) Labs Reviewed  CBC WITH DIFFERENTIAL/PLATELET - Abnormal; Notable for the following:       Result Value   Hemoglobin 10.3 (*)    HCT 32.6 (*)    MCV 73.3 (*)    MCH 23.1 (*)    Platelets 124 (*)    All other components within normal limits  COMPREHENSIVE METABOLIC PANEL - Abnormal; Notable for the following:    Sodium 133 (*)    Chloride 100 (*)    Glucose, Bld 149 (*)    Calcium 8.3 (*)    ALT 14 (*)    All other components within normal limits  I-STAT CHEM 8, ED - Abnormal; Notable for the following:    Chloride 99 (*)    Glucose, Bld 147 (*)    Calcium, Ion 1.06 (*)    Hemoglobin 11.9 (*)    HCT 35.0 (*)    All other components within normal limits  I-STAT CG4 LACTIC ACID, ED - Abnormal; Notable for the following:    Lactic Acid, Venous 3.36 (*)    All other components within normal limits  I-STAT VENOUS BLOOD GAS, ED - Abnormal; Notable for the following:    pH, Ven 7.453 (*)    pCO2, Ven 34.4 (*)    pO2, Ven 179.0 (*)    All other components  within normal limits  POC OCCULT BLOOD, ED - Abnormal; Notable for the following:    Fecal Occult Bld POSITIVE (*)    All other components within normal limits  CULTURE, BLOOD (ROUTINE X 2)  CULTURE, BLOOD (ROUTINE X 2)  LIPASE, BLOOD  GAMMA GT  I-STAT TROPONIN, ED  I-STAT CG4 LACTIC ACID, ED  TYPE AND SCREEN    EKG  EKG Interpretation None       Radiology Ct Head Wo Contrast  Result Date: 06/04/2017 CLINICAL DATA:  83 y/o  M; sudden unresponsiveness. EXAM: CT HEAD WITHOUT CONTRAST TECHNIQUE: Contiguous axial images were obtained from the base of the skull through the  vertex without intravenous contrast. COMPARISON:  02/09/2015 CT head FINDINGS: Brain: Massive hemorrhage centered within the right basal ganglia extending into the upper brainstem and measuring 10.6 x 5.9 x 6.4 cm (volume = 210 cm^3) with dissection of the hemorrhage into the right cerebral hemisphere and the right lateral ventricle which is massively distended. Additionally, there is hemorrhage extending into the left lateral ventricle, third ventricle, and fourth ventricle. The left lateral ventricle is severely enlarged likely due to obstruction. There is extensive edema predominantly throughout the right cerebral hemisphere. Mass effect results in 25 mm of right-to-left midline shift at level of septum pellucidum. There is complete effacement of basilar cisterns. Vascular: Calcific atherosclerosis of carotid siphons. Skull: Postsurgical changes related to a suboccipital craniectomy with encephalomalacia in the inferior cerebellum. Sinuses/Orbits: Right maxillary sinus mucous retention cyst. Otherwise negative. Other: The patient is intubated within oral enteric tube. IMPRESSION: 1. Massive hemorrhage centered in right basal ganglia extending into upper brainstem, approximately 210 cc. 2. Intraventricular hemorrhage greatest in right lateral ventricle. 3. Severe enlargement of left lateral ventricle, likely due to obstruction.  4. 25 mm right-to-left midline shift at level of septum pellucidum. Complete basilar cistern and sulcal effacement indicating increased intracranial pressure. Critical Value/emergent results were called by telephone at the time of interpretation on 05/26/2017 at 11:42 pm to Dr. Orson SlickANDREW COLSON , who verbally acknowledged these results. Electronically Signed   By: Mitzi HansenLance  Furusawa-Stratton M.D.   On: 05/11/2017 23:44   Dg Chest Portable 1 View  Result Date: 05/26/2017 CLINICAL DATA:  Tube placement, GI bleed EXAM: PORTABLE CHEST 1 VIEW COMPARISON:  08/29/2016 FINDINGS: Endotracheal tube tip about 2.6 cm superior to the carina. No focal infiltrate or effusion. Normal heart size. Tortuous ectatic aorta with calcification. No pneumothorax. IMPRESSION: Endotracheal tube tip about 2.6 cm superior to the carina. No infiltrate or edema Electronically Signed   By: Jasmine PangKim  Fujinaga M.D.   On: 05/16/2017 23:34    Procedures .Critical Care Performed by: Shaune PollackISAACS, Alexsus Papadopoulos Authorized by: Shaune PollackISAACS, Tylene Quashie    CRITICAL CARE Performed by: Dollene Clevelandameron Isascs   Total critical care time: 35 minutes  Critical care time was exclusive of separately billable procedures and treating other patients.  Critical care was necessary to treat or prevent imminent or life-threatening deterioration.  Critical care was time spent personally by me on the following activities: development of treatment plan with patient and/or surrogate as well as nursing, discussions with consultants, evaluation of patient's response to treatment, examination of patient, obtaining history from patient or surrogate, ordering and performing treatments and interventions, ordering and review of laboratory studies, ordering and review of radiographic studies, pulse oximetry and re-evaluation of patient's condition.     (including critical care time)  Medications Ordered in ED Medications  midazolam (VERSED) injection 2 mg (not administered)  morphine 4 MG/ML  injection 4 mg (not administered)  sodium chloride 0.9 % bolus 1,000 mL (0 mLs Intravenous Stopped Aug 14, 2017 0033)  morphine 4 MG/ML injection 4 mg (4 mg Intravenous Given Aug 14, 2017 0027)  midazolam (VERSED) injection 2 mg (2 mg Intravenous Given Aug 14, 2017 0027)     Initial Impression / Assessment and Plan / ED Course  I have reviewed the triage vital signs and the nursing notes.  Pertinent labs & imaging results that were available during my care of the patient were reviewed by me and considered in my medical decision making (see chart for details).     83 yo M with h/o CVA x 2 here with acute onset AMS. Pt  arrived intubated, with blown pupils bilaterally and no response to stimuli. Unfortunately, CT scan shows massive intracranial hemorrhage with significant midline shift and herniation. Discussed findings with NSG under Dr. Yetta Barre. Given age, comorbidities, and volume of bleed, do not feel bleed is survivable. Pt does have spontaneous respirations but no gag, no other signs of significant brain function. I discussed CT findings, my concerns, and exam with pt's wife and son, who are very appropriate and understanding. Pt extubated to room air, with morphine/versed PRN. He seems comfortable, though I suspect this is more so 2/2 lack of any higher brain functions due to massive ICH. Will admit to Hospitalist for comfort care, likely death in hospital. Family updated, at bedside, and appropriate throughout ED visit.   Final Clinical Impressions(s) / ED Diagnoses   Final diagnoses:  Intracranial hemorrhage [facility])    New Prescriptions Current Discharge Medication List       Shaune Pollack, MD June 11, 2017 [phone]

## 2017-06-05 NOTE — ED Notes (Signed)
Pt taken to CT.

## 2017-06-06 ENCOUNTER — Encounter (HOSPITAL_COMMUNITY): Payer: Self-pay | Admitting: Internal Medicine

## 2017-06-06 DIAGNOSIS — Z880 Allergy status to penicillin: Secondary | ICD-10-CM | POA: Diagnosis not present

## 2017-06-06 DIAGNOSIS — G935 Compression of brain: Secondary | ICD-10-CM | POA: Diagnosis present

## 2017-06-06 DIAGNOSIS — I629 Nontraumatic intracranial hemorrhage, unspecified: Secondary | ICD-10-CM | POA: Diagnosis not present

## 2017-06-06 DIAGNOSIS — R29740 NIHSS score 40: Secondary | ICD-10-CM | POA: Diagnosis present

## 2017-06-06 DIAGNOSIS — Z79899 Other long term (current) drug therapy: Secondary | ICD-10-CM | POA: Diagnosis not present

## 2017-06-06 DIAGNOSIS — Z8249 Family history of ischemic heart disease and other diseases of the circulatory system: Secondary | ICD-10-CM | POA: Diagnosis not present

## 2017-06-06 DIAGNOSIS — I10 Essential (primary) hypertension: Secondary | ICD-10-CM | POA: Diagnosis present

## 2017-06-06 DIAGNOSIS — N400 Enlarged prostate without lower urinary tract symptoms: Secondary | ICD-10-CM | POA: Diagnosis present

## 2017-06-06 DIAGNOSIS — Z66 Do not resuscitate: Secondary | ICD-10-CM | POA: Diagnosis present

## 2017-06-06 DIAGNOSIS — D509 Iron deficiency anemia, unspecified: Secondary | ICD-10-CM | POA: Diagnosis present

## 2017-06-06 DIAGNOSIS — Z515 Encounter for palliative care: Secondary | ICD-10-CM | POA: Diagnosis present

## 2017-06-06 DIAGNOSIS — I615 Nontraumatic intracerebral hemorrhage, intraventricular: Secondary | ICD-10-CM | POA: Diagnosis present

## 2017-06-06 DIAGNOSIS — Z8673 Personal history of transient ischemic attack (TIA), and cerebral infarction without residual deficits: Secondary | ICD-10-CM | POA: Diagnosis not present

## 2017-06-06 DIAGNOSIS — Z993 Dependence on wheelchair: Secondary | ICD-10-CM | POA: Diagnosis not present

## 2017-06-06 DIAGNOSIS — Z8611 Personal history of tuberculosis: Secondary | ICD-10-CM | POA: Diagnosis not present

## 2017-06-06 LAB — CBC WITH DIFFERENTIAL/PLATELET
BASOS PCT: 0 %
Basophils Absolute: 0 10*3/uL (ref 0.0–0.1)
EOS ABS: 0.2 10*3/uL (ref 0.0–0.7)
Eosinophils Relative: 2 %
HCT: 32.6 % — ABNORMAL LOW (ref 39.0–52.0)
Hemoglobin: 10.3 g/dL — ABNORMAL LOW (ref 13.0–17.0)
LYMPHS ABS: 1.3 10*3/uL (ref 0.7–4.0)
Lymphocytes Relative: 14 %
MCH: 23.1 pg — ABNORMAL LOW (ref 26.0–34.0)
MCHC: 31.6 g/dL (ref 30.0–36.0)
MCV: 73.3 fL — AB (ref 78.0–100.0)
MONO ABS: 0.4 10*3/uL (ref 0.1–1.0)
Monocytes Relative: 4 %
NEUTROS PCT: 80 %
Neutro Abs: 7.5 10*3/uL (ref 1.7–7.7)
PLATELETS: 124 10*3/uL — AB (ref 150–400)
RBC: 4.45 MIL/uL (ref 4.22–5.81)
RDW: 13.9 % (ref 11.5–15.5)
Smear Review: ADEQUATE
WBC: 9.4 10*3/uL (ref 4.0–10.5)

## 2017-06-06 LAB — TYPE AND SCREEN
ABO/RH(D): B POS
ANTIBODY SCREEN: NEGATIVE

## 2017-06-06 MED ORDER — GLYCOPYRROLATE 0.2 MG/ML IJ SOLN
0.4000 mg | INTRAMUSCULAR | Status: DC | PRN
  Administered 2017-06-06: 0.4 mg via INTRAVENOUS
  Filled 2017-06-06: qty 2

## 2017-06-06 MED ORDER — LORAZEPAM 2 MG/ML IJ SOLN: 1.0000 mg | INTRAMUSCULAR | Status: DC | PRN

## 2017-06-06 MED ORDER — MIDAZOLAM HCL 2 MG/2ML IJ SOLN
2.0000 mg | Freq: Once | INTRAMUSCULAR | Status: AC
  Administered 2017-06-06: 2 mg via INTRAVENOUS
  Filled 2017-06-06: qty 2

## 2017-06-06 MED ORDER — MORPHINE SULFATE (PF) 2 MG/ML IV SOLN
2.0000 mg | INTRAVENOUS | Status: DC | PRN
Start: 2017-06-06 — End: 2017-06-06
  Administered 2017-06-06 (×3): 2 mg via INTRAVENOUS
  Filled 2017-06-06 (×3): qty 1

## 2017-06-06 MED ORDER — MORPHINE SULFATE (PF) 4 MG/ML IV SOLN
4.0000 mg | Freq: Once | INTRAVENOUS | Status: AC
  Administered 2017-06-06: 4 mg via INTRAVENOUS
  Filled 2017-06-06: qty 1

## 2017-06-06 MED ORDER — MORPHINE SULFATE (PF) 4 MG/ML IV SOLN: 4.0000 mg | INTRAVENOUS | Status: DC | PRN

## 2017-06-06 MED ORDER — MIDAZOLAM HCL 2 MG/2ML IJ SOLN: 2.0000 mg | INTRAMUSCULAR | Status: DC | PRN

## 2017-06-10 LAB — CULTURE, BLOOD (ROUTINE X 2)
CULTURE: NO GROWTH
Culture: NO GROWTH
SPECIAL REQUESTS: ADEQUATE
Special Requests: ADEQUATE

## 2017-06-10 NOTE — Progress Notes (Signed)
Patient arrived from ED via stretcher accompanied by wife and son.  Per sons request there are to be no vital signs taken, only comfort measures. Only wants [facility] notified. No funeral home.

## 2017-06-10 NOTE — Progress Notes (Signed)
The family wishes to have no funeral services and was questioning how this would be arranged. Phoned [facility] at [phone] and spoke with Riki RuskJeremy, the owner.  He informed me that the same protocol is followed and he will be in touch with family at that time to determine what they wish to do.

## 2017-06-10 NOTE — Progress Notes (Signed)
Bedside report given to RN. Pts spouse lying beside patient, very emotional, sobbing.  Patient's breathing does appear to be more labored. Morphine was given at this time for ease of breathing.

## 2017-06-10 NOTE — ED Notes (Signed)
Attempted to call report to 13M unit.

## 2017-06-10 NOTE — H&P (Signed)
History and Physical    [name] LagosJimmy L [name] JYN:[id number] DOB: 01/20/1934 DOA: 05/29/2017  PCP: [name] JoeSwayne, David, MD  Patient coming from: Home.  History obtained from patient's wife and son. And ER physician.  Chief Complaint: Loss of consciousness.  HPI: [name] is a 83 y.o. male with history of hemorrhagic stroke, hypertension was brought to the ER after patient had sudden loss of consciousness while talking to his wife. Patient became unresponsive and EMS was called. Patient had dilated pupils. Patient was intubated and brought to the hospital.   ED Course: In the hospital patient has spontaneous breathing pupils appear dilated. CT of the head shows massive bleed. On-call neurosurgeon Dr. Yetta [name] was consulted by the ER physician and at this time Dr. Yetta [name] neurosurgeon felt that this event will not be able to survived. Patient was terminally extubated and is being admitted for comfort measures.  Review of Systems: As per HPI, rest all negative.   Past Medical History:  Diagnosis Date  . Enlarged prostate   . Hypertension   . Inguinal hernia   . Stroke [facility](HCC) 2004   2000- bleed- right sided weakness  . Tuberculosis 1990's    Past Surgical History:  Procedure Laterality Date  . BRAIN SURGERY  2000   bleed  . FRACTURE SURGERY    . HIP PINNING,CANNULATED Right 02/10/2015   Procedure: CANNULATED HIP PINNING;  Surgeon: [name] Apleyimothy D Murphy, MD;  Location: [facility] OR;  Service: Orthopedics;  Laterality: Right;  . INGUINAL HERNIA REPAIR Bilateral 09/02/2016   open  . INGUINAL HERNIA REPAIR Bilateral 09/02/2016   Procedure: OPEN BILATERAL INGUINAL HERNIA REPAIR;  Surgeon: [name] NormanJames Wyatt, MD;  [facility];  Service: General;  Laterality: Bilateral;  . INSERTION OF MESH Right 09/02/2016   Procedure: INSERTION OF MESH;  Surgeon: [name] NormanJames Wyatt, MD;  [facility];  Service: General;  Laterality: Right;     reports that he has never smoked. He has never used smokeless tobacco. He reports that  he does not drink alcohol or use drugs.  Allergies  Allergen Reactions  . Baclofen Other (See Comments)    Confusion  . Penicillins Hives    Has patient had a PCN reaction causing immediate rash, facial/tongue/throat swelling, SOB or lightheadedness with hypotension:unsure Has patient had a PCN reaction causing severe rash involving mucus membranes or skin necrosis:unsure Has patient had a PCN reaction that required hospitalization:unsure Has patient had a PCN reaction occurring within the last 10 years:No If all of the above answers are "NO", then may proceed with Cephalosporin use.     Family History  Problem Relation Age of Onset  . Hypertension Other     Prior to Admission medications   Medication Sig Start Date End Date Taking? Authorizing Provider  Dutasteride-Tamsulosin HCl 0.5-0.4 MG CAPS Take 1 capsule by mouth daily. 02/22/15  Yes Angiulli, Mcarthur Rossettianiel J, PA-C  ketotifen (ZADITOR) 0.025 % ophthalmic solution Place 1 drop into both eyes 2 (two) times daily as needed (for dry/irritated eyes.).   Yes [provider]  oxyCODONE (OXY IR/ROXICODONE) 5 MG immediate release tablet Take 1 tablet (5 mg total) by mouth every 4 (four) hours as needed for moderate pain. 09/04/16  Yes [name] NormanWyatt, James, MD  TOPROL XL 50 MG 24 hr tablet Take 50 mg by mouth daily. 05/03/15  Yes [provider]    Physical Exam: Vitals:   05-02-17 0000 05-02-17 0015 05-02-17 0045 05-02-17 0100  BP: 121/77 114/62  107/65  Pulse: 84 87 91 93  Resp: 16 16 14 14   Temp:      TempSrc:      SpO2: 100% 100% (!) 89% (!) 89%  Weight:      Height:          Constitutional: Moderately built and nourished. Vitals:   05/22/2017 0000 05/21/2017 0015 05/17/2017 0045 06/09/2017 0100  BP: 121/77 114/62  107/65  Pulse: 84 87 91 93  Resp: 16 16 14 14   Temp:      TempSrc:      SpO2: 100% 100% (!) 89% (!) 89%  Weight:      Height:       Eyes: Anicteric no pallor. ENMT: No discharge from the ears eyes nose and  mouth. Neck: No mass felt. No JVD appreciated. Respiratory: No rhonchi or crepitations. Cardiovascular: S1-S2 heard. Abdomen: Soft nontender bowel sounds present. Musculoskeletal: No edema. No joint effusion. Skin: No rash. Skin appears warm. Neurologic: Patient does not respond to commands or deep stimuli. Pupils are dilated. Psychiatric: Patient is encephalopathic.   Labs on Admission: I have personally reviewed following labs and imaging studies  CBC:  Recent Labs Lab 2017/10/17 2307 2017/10/17 2318  WBC 9.4  --   NEUTROABS 7.5  --   HGB 10.3* 11.9*  HCT 32.6* 35.0*  MCV 73.3*  --   PLT 124*  --    Basic Metabolic Panel:  Recent Labs Lab 2017/10/17 2307 2017/10/17 2318  NA 133* 136  K 3.6 3.6  CL 100* 99*  CO2 22  --   GLUCOSE 149* 147*  BUN 14 16  CREATININE 0.85 0.70  CALCIUM 8.3*  --    GFR: Estimated Creatinine Clearance: 66.1 mL/min (by C-G formula based on SCr of 0.7 mg/dL). Liver Function Tests:  Recent Labs Lab 2017/10/17 2307  AST 37  ALT 14*  ALKPHOS 48  BILITOT 0.5  PROT 6.6  ALBUMIN 3.7    Recent Labs Lab 2017/10/17 2307  LIPASE 32   No results for input(s): AMMONIA in the last 168 hours. Coagulation Profile: No results for input(s): INR, PROTIME in the last 168 hours. Cardiac Enzymes: No results for input(s): CKTOTAL, CKMB, CKMBINDEX, TROPONINI in the last 168 hours. BNP (last 3 results) No results for input(s): PROBNP in the last 8760 hours. HbA1C: No results for input(s): HGBA1C in the last 72 hours. CBG: No results for input(s): GLUCAP in the last 168 hours. Lipid Profile: No results for input(s): CHOL, HDL, LDLCALC, TRIG, CHOLHDL, LDLDIRECT in the last 72 hours. Thyroid Function Tests: No results for input(s): TSH, T4TOTAL, FREET4, T3FREE, THYROIDAB in the last 72 hours. Anemia Panel: No results for input(s): VITAMINB12, FOLATE, FERRITIN, TIBC, IRON, RETICCTPCT in the last 72 hours. Urine analysis:    Component Value Date/Time    COLORURINE YELLOW 07/08/2015 1708   APPEARANCEUR CLEAR 07/08/2015 1708   LABSPEC 1.021 07/08/2015 1708   PHURINE 5.5 07/08/2015 1708   GLUCOSEU NEGATIVE 07/08/2015 1708   HGBUR NEGATIVE 07/08/2015 1708   BILIRUBINUR NEGATIVE 07/08/2015 1708   KETONESUR NEGATIVE 07/08/2015 1708   PROTEINUR NEGATIVE 07/08/2015 1708   UROBILINOGEN 0.2 07/08/2015 1708   NITRITE NEGATIVE 07/08/2015 1708   LEUKOCYTESUR NEGATIVE 07/08/2015 1708   Sepsis Labs: @LABRCNTIP (procalcitonin:4,lacticidven:4) )No results found for this or any previous visit (from the past 240 hour(s)).   Radiological Exams on Admission: Ct Head Wo Contrast  Result Date: 01/20/2017 CLINICAL DATA:  83 y/o  M; sudden unresponsiveness. EXAM: CT HEAD WITHOUT CONTRAST TECHNIQUE: Contiguous axial images were obtained from the base of  the skull through the vertex without intravenous contrast. COMPARISON:  02/09/2015 CT head FINDINGS: Brain: Massive hemorrhage centered within the right basal ganglia extending into the upper brainstem and measuring 10.6 x 5.9 x 6.4 cm (volume = 210 cm^3) with dissection of the hemorrhage into the right cerebral hemisphere and the right lateral ventricle which is massively distended. Additionally, there is hemorrhage extending into the left lateral ventricle, third ventricle, and fourth ventricle. The left lateral ventricle is severely enlarged likely due to obstruction. There is extensive edema predominantly throughout the right cerebral hemisphere. Mass effect results in 25 mm of right-to-left midline shift at level of septum pellucidum. There is complete effacement of basilar cisterns. Vascular: Calcific atherosclerosis of carotid siphons. Skull: Postsurgical changes related to a suboccipital craniectomy with encephalomalacia in the inferior cerebellum. Sinuses/Orbits: Right maxillary sinus mucous retention cyst. Otherwise negative. Other: The patient is intubated within oral enteric tube. IMPRESSION: 1. Massive  hemorrhage centered in right basal ganglia extending into upper brainstem, approximately 210 cc. 2. Intraventricular hemorrhage greatest in right lateral ventricle. 3. Severe enlargement of left lateral ventricle, likely due to obstruction. 4. 25 mm right-to-left midline shift at level of septum pellucidum. Complete basilar cistern and sulcal effacement indicating increased intracranial pressure. Critical Value/emergent results were called by telephone at the time of interpretation on 05/17/2017 at 11:42 pm to Dr. Orson Slick , who verbally acknowledged these results. Electronically Signed   By: Mitzi Hansen M.D.   On: 05/28/2017 23:44   Dg Chest Portable 1 View  Result Date: 05/31/2017 CLINICAL DATA:  Tube placement, GI bleed EXAM: PORTABLE CHEST 1 VIEW COMPARISON:  08/29/2016 FINDINGS: Endotracheal tube tip about 2.6 cm superior to the carina. No focal infiltrate or effusion. Normal heart size. Tortuous ectatic aorta with calcification. No pneumothorax. IMPRESSION: Endotracheal tube tip about 2.6 cm superior to the carina. No infiltrate or edema Electronically Signed   By: Jasmine Pang M.D.   On: 05/22/2017 23:34     Assessment/Plan Principal Problem:   Intracranial bleeding (HCC) Active Problems:   Essential hypertension   Microcytic anemia    1. Massive intracranial bleed. 2. Hypertension. 3. History of hemorrhagic stroke. 4. Normocytic normochromic anemia  Plan - after discussing with patient's family son and wife patient has been terminally extubated admitted for comfort measures. Patient has been placed on when necessary morphine. Palliative care has been consulted.   DVT prophylaxis: SCDs. Code Status: DO NOT RESUSCITATE.  Family Communication: Son and wife.  Disposition Plan: To be determined.  Consults called: Palliative care.  Admission status: Inpatient.    Eduard Clos MD [facility] Pager [phone].  If 7PM-7AM, please contact  night-coverage [email or web address] Password [place]  2017-06-09, 3:43 AM

## 2017-06-10 NOTE — Consult Note (Signed)
Consultation Note Date: 06/08/2017   Patient Name: [name]  DOB: 05/10/1934  MRN: [id number]  Age / Sex: 83 y.o., male  PCP: Tally JoeSwayne, David, MD Referring Physician: Calvert Cantorizwan, Saima, MD  Reason for Consultation: Terminal Care  HPI/Patient Profile: 83 y.o. male  with past medical history of   admitted on 2016/11/26 with  .   Clinical Assessment and Goals of Care:  [name] a 82 y.o.malewith history of hemorrhagic stroke, hypertension, wheelchair bound was brought to the ER after patient had sudden loss of consciousness while talking to his wife. proir to losing consciousness, he cough and his wife noted bloody sputum.  Patient became unresponsive and EMS was called. Patient had dilated pupils. Patient was intubated and brought to the hospital.In the hospital patient has spontaneous breathing pupils appear dilated. CT of the head shows massive bleed. On-call neurosurgeon Dr. Yetta BarreJones was consulted by the ER physician and at this time Dr. Yetta BarreJones neurosurgeon felt that this event will not be able to survived. Patient was terminally extubated and is being admitted for comfort measures.  A palliative consult has been placed for ongoing comfort measures and support to the family.   The patient is unresponsive in bed, he has loud breathing, he is not in distress, wife is at bedside, I introduced myself and palliative care as follows: Palliative medicine is specialized medical care for people living with serious illness. It focuses on providing relief from the symptoms and stress of a serious illness. The goal is to improve quality of life for both the patient and the family.  Patient has history of strokes in the past, he and his wife are originally from [place], he was an [employer] office, his wife describes him as an honest hard working man, she is tearful and still some what shocked that there were in  bed, having a conversation about their relatives back in [place] when he became unresponsive, there is no history of fall.   Offered her with active listening and supportive care, see below:  HCPOA  wife son  SUMMARY OF RECOMMENDATIONS    DNR DNI Comfort care Agree with IV Moprhine PRN Add Ativan IV PRN Add Robinul IV PRN for secretions Not medical examiner case as cause of brain bleed is non traumatic, patient with prior history of hemorrhagic strokes.  Continue to support wife and family Prognosis hours to some very limited number of days  Code Status/Advance Care Planning:  DNR    Symptom Management:    as above   Palliative Prophylaxis:   Delirium Protocol  Additional Recommendations (Limitations, Scope, Preferences):  Full Comfort Care  Psycho-social/Spiritual:   Desire for further Chaplaincy support:yes  Additional Recommendations: Education on Hospice  Prognosis:   Hours - Days  Discharge Planning: Anticipated Hospital Death      Primary Diagnoses: Present on Admission: . Microcytic anemia . Essential hypertension   I have reviewed the medical record, interviewed the patient and family, and examined the patient. The following aspects are pertinent.  Past Medical  History:  Diagnosis Date  . Enlarged prostate   . Hypertension   . Inguinal hernia   . Stroke [facility]) 2004   2000- bleed- right sided weakness  . Tuberculosis 1990's   Social History   Social History  . Marital status: Married    Spouse name: N/A  . Number of children: N/A  . Years of education: N/A   Social History Main Topics  . Smoking status: Never Smoker  . Smokeless tobacco: Never Used  . Alcohol use No  . Drug use: No  . Sexual activity: Not Asked   Other Topics Concern  . None   Social History Narrative  . None   Family History  Problem Relation Age of Onset  . Hypertension Other    Scheduled Meds: Continuous Infusions: PRN Meds:.glycopyrrolate,  LORazepam, morphine injection Medications Prior to Admission:  Prior to Admission medications   Medication Sig Start Date End Date Taking? Authorizing Provider  Dutasteride-Tamsulosin HCl 0.5-0.4 MG CAPS Take 1 capsule by mouth daily. 02/22/15  Yes Angiulli, Mcarthur Rossetti, PA-C  ketotifen (ZADITOR) 0.025 % ophthalmic solution Place 1 drop into both eyes 2 (two) times daily as needed (for dry/irritated eyes.).   Yes [provider]  oxyCODONE (OXY IR/ROXICODONE) 5 MG immediate release tablet Take 1 tablet (5 mg total) by mouth every 4 (four) hours as needed for moderate pain. 09/04/16  Yes Jimmye Norman, MD  TOPROL XL 50 MG 24 hr tablet Take 50 mg by mouth daily. 05/03/15  Yes [provider]   Allergies  Allergen Reactions  . Baclofen Other (See Comments)    Confusion  . Penicillins Hives    Has patient had a PCN reaction causing immediate rash, facial/tongue/throat swelling, SOB or lightheadedness with hypotension:unsure Has patient had a PCN reaction causing severe rash involving mucus membranes or skin necrosis:unsure Has patient had a PCN reaction that required hospitalization:unsure Has patient had a PCN reaction occurring within the last 10 years:No If all of the above answers are "NO", then may proceed with Cephalosporin use.    Review of Systems Non verbal  Physical Exam Unresponsive Rapid shallow breathing No distress S1 S2 Abdomen soft No coolness no mottling of extremities  Vital Signs: BP 107/65 (BP Location: Right Arm)   Pulse 93   Temp 98.6 F (37 C)   Resp 16   Ht 5\' 9"  (1.753 m)   Wt 63.5 kg (140 lb)   SpO2 (!) 89%   BMI 20.67 kg/m      Pain Score: Asleep   SpO2: SpO2: (!) 89 % O2 Device:SpO2: (!) 89 % O2 Flow Rate: .   IO: Intake/output summary:  Intake/Output Summary (Last 24 hours) at 05/13/2017 1323 Last data filed at 06/09/2017 0100  Gross per 24 hour  Intake              600 ml  Output                0 ml  Net              600 ml     LBM:   Baseline Weight: Weight: 63.5 kg (140 lb) Most recent weight: Weight: 63.5 kg (140 lb)     Palliative Assessment/Data:   Flowsheet Rows     Most Recent Value  Intake Tab  Referral Department  Hospitalist  Unit at Time of Referral  Med/Surg Unit  Palliative Care Primary Diagnosis  Neurology  Palliative Care Type  New Palliative care  Reason  for referral  End of Life Care Assistance, Non-pain Symptom  Clinical Assessment  Palliative Performance Scale Score  10%  Pain Max last 24 hours  4  Pain Min Last 24 hours  3  Dyspnea Max Last 24 Hours  4  Dyspnea Min Last 24 hours  3  Nausea Max Last 24 Hours  0  Nausea Min Last 24 Hours  0  Anxiety Max Last 24 Hours  5  Anxiety Min Last 24 Hours  4  Psychosocial & Spiritual Assessment  Palliative Care Outcomes  Patient/Family meeting held?  Yes  Who was at the meeting?  wife       Time In:  1400 Time Out:  1500 Time Total:  60 min  Greater than 50%  of this time was spent counseling and coordinating care related to the above assessment and plan.  Signed by: Rosalin HawkingZeba Krysia Zahradnik, MD  [phone]  [phone]  Please contact Palliative Medicine Team phone at [phone] for questions and concerns.  For individual provider: See Loretha StaplerAmion

## 2017-06-10 NOTE — Death Summary Note (Addendum)
DEATH SUMMARY   Patient Details  Name: [name] MRN: [id number] DOB: 08/03/1934  Admission/Discharge Information   Admit Date:  January 28, 2017  Date of Death: Date of Death: 06/03/2017  Time of Death: Time of Death: 1723  Length of Stay: 1  Referring Physician: Tally JoeSwayne, David, MD   Reason(s) for Hospitalization  Unresponsiveness  Diagnoses  Preliminary cause of death:  Secondary Diagnoses (including complications and co-morbidities):  Principal Problem:   Intracranial bleeding [facility](HCC) Active Problems:   Essential hypertension   Microcytic anemia   Brief Hospital Course (including significant findings, care, treatment, and services provided and events leading to death)  [name] is a 83 y.o. year old male who with history of hemorrhagic stroke, hypertension, wheelchair bound was brought to the ER after patient had sudden loss of consciousness while talking to his wife when they were about to go to sleep. He slept on a bed in the living room and she slept on the couch in the same room. His wife noted that he coughed up a small amount of blood prior to becoming unconscious.   His wife called EMS and then her son. Patient had dilated pupils and was unresponsive. He was intubated by EMS and brought to the hospital. CT of the head showed a massive right basal ganglia intracranial hemorrhage with 25 mm right to left midline shift.  On-call neurosurgeon Dr. Yetta BarreJones was consulted by the ER physician and felt that the patient would not survive the event. He was terminally extubated and was admitted for comfort measures. He died in the hospital the following day. His wife and son were at bedside and appreciative of his care.    Pertinent Labs and Studies  Significant Diagnostic Studies Ct Head Wo Contrast  Result Date: January 28, 2017 CLINICAL DATA:  83 y/o  M; sudden unresponsiveness. EXAM: CT HEAD WITHOUT CONTRAST TECHNIQUE: Contiguous axial images were obtained from the base of the  skull through the vertex without intravenous contrast. COMPARISON:  02/09/2015 CT head FINDINGS: Brain: Massive hemorrhage centered within the right basal ganglia extending into the upper brainstem and measuring 10.6 x 5.9 x 6.4 cm (volume = 210 cm^3) with dissection of the hemorrhage into the right cerebral hemisphere and the right lateral ventricle which is massively distended. Additionally, there is hemorrhage extending into the left lateral ventricle, third ventricle, and fourth ventricle. The left lateral ventricle is severely enlarged likely due to obstruction. There is extensive edema predominantly throughout the right cerebral hemisphere. Mass effect results in 25 mm of right-to-left midline shift at level of septum pellucidum. There is complete effacement of basilar cisterns. Vascular: Calcific atherosclerosis of carotid siphons. Skull: Postsurgical changes related to a suboccipital craniectomy with encephalomalacia in the inferior cerebellum. Sinuses/Orbits: Right maxillary sinus mucous retention cyst. Otherwise negative. Other: The patient is intubated within oral enteric tube. IMPRESSION: 1. Massive hemorrhage centered in right basal ganglia extending into upper brainstem, approximately 210 cc. 2. Intraventricular hemorrhage greatest in right lateral ventricle. 3. Severe enlargement of left lateral ventricle, likely due to obstruction. 4. 25 mm right-to-left midline shift at level of septum pellucidum. Complete basilar cistern and sulcal effacement indicating increased intracranial pressure. Critical Value/emergent results were called by telephone at the time of interpretation on January 28, 2017 at 11:42 pm to Dr. Orson SlickANDREW COLSON , who verbally acknowledged these results. Electronically Signed   By: Mitzi HansenLance  Furusawa-Stratton M.D.   On: 0March 21, 2018 23:44   Dg Chest Portable 1 View  Result Date: January 28, 2017 CLINICAL DATA:  Tube placement, GI bleed  EXAM: PORTABLE CHEST 1 VIEW COMPARISON:  08/29/2016 FINDINGS:  Endotracheal tube tip about 2.6 cm superior to the carina. No focal infiltrate or effusion. Normal heart size. Tortuous ectatic aorta with calcification. No pneumothorax. IMPRESSION: Endotracheal tube tip about 2.6 cm superior to the carina. No infiltrate or edema Electronically Signed   By: Jasmine PangKim  Fujinaga M.D.   On: 07-06-17 23:34    Microbiology Recent Results (from the past 240 hour(s))  Culture, blood (Routine X 2) w Reflex to ID Panel     Status: None (Preliminary result)   Collection Time: 2017/10/27 10:58 PM  Result Value Ref Range Status   Specimen Description BLOOD RIGHT ANTECUBITAL  Final   Special Requests   Final    BOTTLES DRAWN AEROBIC AND ANAEROBIC Blood Culture adequate volume   Culture NO GROWTH 2 DAYS  Final   Report Status PENDING  Incomplete  Culture, blood (Routine X 2) w Reflex to ID Panel     Status: None (Preliminary result)   Collection Time: 2017/10/27 11:05 PM  Result Value Ref Range Status   Specimen Description BLOOD LEFT ANTECUBITAL  Final   Special Requests   Final    BOTTLES DRAWN AEROBIC AND ANAEROBIC Blood Culture adequate volume   Culture NO GROWTH 2 DAYS  Final   Report Status PENDING  Incomplete    Lab Basic Metabolic Panel:  Recent Labs Lab 2017/10/27 2307 2017/10/27 2318  NA 133* 136  K 3.6 3.6  CL 100* 99*  CO2 22  --   GLUCOSE 149* 147*  BUN 14 16  CREATININE 0.85 0.70  CALCIUM 8.3*  --    Liver Function Tests:  Recent Labs Lab 2017/10/27 2307  AST 37  ALT 14*  ALKPHOS 48  BILITOT 0.5  PROT 6.6  ALBUMIN 3.7    Recent Labs Lab 2017/10/27 2307  LIPASE 32   No results for input(s): AMMONIA in the last 168 hours. CBC:  Recent Labs Lab 2017/10/27 2307 2017/10/27 2318  WBC 9.4  --   NEUTROABS 7.5  --   HGB 10.3* 11.9*  HCT 32.6* 35.0*  MCV 73.3*  --   PLT 124*  --    Cardiac Enzymes: No results for input(s): CKTOTAL, CKMB, CKMBINDEX, TROPONINI in the last 168 hours. Sepsis Labs:  Recent Labs Lab 2017/10/27 2307  2017/10/27 2319  WBC 9.4  --   LATICACIDVEN  --  3.36*        [name] 06/07/2017, 4:18 PM

## 2017-06-10 NOTE — ED Notes (Signed)
Pt. extubated by RT with EDP/RN at bedside , comfortable , IV sites intact , respirations unlabored .

## 2017-06-10 NOTE — Progress Notes (Signed)
PROGRESS NOTE    [name] [name]   [id number]  DOB: 09/23/1934  DOA: 05/27/2017 PCP: Tally JoeSwayne, David, MD   Brief Narrative:  [name] is a 83 y.o. male with history of hemorrhagic stroke, hypertension, wheelchair bound was brought to the ER after patient had sudden loss of consciousness while talking to his wife. proir to losing consciousness, he cough and his wife noted bloody sputum.  Patient became unresponsive and EMS was called. Patient had dilated pupils. Patient was intubated and brought to the hospital.  In the hospital patient has spontaneous breathing pupils appear dilated. CT of the head shows massive bleed. On-call neurosurgeon Dr. Yetta BarreJones was consulted by the ER physician and at this time Dr. Yetta BarreJones neurosurgeon felt that this event will not be able to survived. Patient was terminally extubated and is being admitted for comfort measures.   Subjective: He is unresponsive. His wife and son are at bedside. All questions answered.    Assessment & Plan:   Principal Problem:   Intracranial bleeding  - this is his third intracranial hemorrhage and he is currently unresponsive - occasionally moaning - continue comfort care- PRN Morphine being administered - family is appreciative.    Antimicrobials:  Anti-infectives    None       Objective: Vitals:   12/07/2016 0100 12/07/2016 0354 12/07/2016 0459 12/07/2016 0511  BP: 107/65     Pulse: 93     Resp: 14 16 16 16   Temp:      TempSrc:      SpO2: (!) 89%     Weight:      Height:        Intake/Output Summary (Last 24 hours) at 12/07/2016 1257 Last data filed at 12/07/2016 0100  Gross per 24 hour  Intake              600 ml  Output                0 ml  Net              600 ml   Filed Weights   05/19/2017 2302  Weight: 63.5 kg (140 lb)    Examination: General exam: Appears comfortable   Data Reviewed: I have personally reviewed following labs and imaging studies  CBC:  Recent Labs Lab 05/16/2017 2307  05/27/2017 2318  WBC 9.4  --   NEUTROABS 7.5  --   HGB 10.3* 11.9*  HCT 32.6* 35.0*  MCV 73.3*  --   PLT 124*  --    Basic Metabolic Panel:  Recent Labs Lab 05/23/2017 2307 06/01/2017 2318  NA 133* 136  K 3.6 3.6  CL 100* 99*  CO2 22  --   GLUCOSE 149* 147*  BUN 14 16  CREATININE 0.85 0.70  CALCIUM 8.3*  --    GFR: Estimated Creatinine Clearance: 66.1 mL/min (by C-G formula based on SCr of 0.7 mg/dL). Liver Function Tests:  Recent Labs Lab 05/18/2017 2307  AST 37  ALT 14*  ALKPHOS 48  BILITOT 0.5  PROT 6.6  ALBUMIN 3.7    Recent Labs Lab 06/01/2017 2307  LIPASE 32   No results for input(s): AMMONIA in the last 168 hours. Coagulation Profile: No results for input(s): INR, PROTIME in the last 168 hours. Cardiac Enzymes: No results for input(s): CKTOTAL, CKMB, CKMBINDEX, TROPONINI in the last 168 hours. BNP (last 3 results) No results for input(s): PROBNP in the last 8760 hours. HbA1C: No results for input(s): HGBA1C in  the last 72 hours. CBG: No results for input(s): GLUCAP in the last 168 hours. Lipid Profile: No results for input(s): CHOL, HDL, LDLCALC, TRIG, CHOLHDL, LDLDIRECT in the last 72 hours. Thyroid Function Tests: No results for input(s): TSH, T4TOTAL, FREET4, T3FREE, THYROIDAB in the last 72 hours. Anemia Panel: No results for input(s): VITAMINB12, FOLATE, FERRITIN, TIBC, IRON, RETICCTPCT in the last 72 hours. Urine analysis:    Component Value Date/Time   COLORURINE YELLOW 07/08/2015 1708   APPEARANCEUR CLEAR 07/08/2015 1708   LABSPEC 1.021 07/08/2015 1708   PHURINE 5.5 07/08/2015 1708   GLUCOSEU NEGATIVE 07/08/2015 1708   HGBUR NEGATIVE 07/08/2015 1708   BILIRUBINUR NEGATIVE 07/08/2015 1708   KETONESUR NEGATIVE 07/08/2015 1708   PROTEINUR NEGATIVE 07/08/2015 1708   UROBILINOGEN 0.2 07/08/2015 1708   NITRITE NEGATIVE 07/08/2015 1708   LEUKOCYTESUR NEGATIVE 07/08/2015 1708   Sepsis Labs: @LABRCNTIP (procalcitonin:4,lacticidven:4) )No  results found for this or any previous visit (from the past 240 hour(s)).       Radiology Studies: Ct Head Wo Contrast  Result Date: 09/02/17 CLINICAL DATA:  83 y/o  M; sudden unresponsiveness. EXAM: CT HEAD WITHOUT CONTRAST TECHNIQUE: Contiguous axial images were obtained from the base of the skull through the vertex without intravenous contrast. COMPARISON:  02/09/2015 CT head FINDINGS: Brain: Massive hemorrhage centered within the right basal ganglia extending into the upper brainstem and measuring 10.6 x 5.9 x 6.4 cm (volume = 210 cm^3) with dissection of the hemorrhage into the right cerebral hemisphere and the right lateral ventricle which is massively distended. Additionally, there is hemorrhage extending into the left lateral ventricle, third ventricle, and fourth ventricle. The left lateral ventricle is severely enlarged likely due to obstruction. There is extensive edema predominantly throughout the right cerebral hemisphere. Mass effect results in 25 mm of right-to-left midline shift at level of septum pellucidum. There is complete effacement of basilar cisterns. Vascular: Calcific atherosclerosis of carotid siphons. Skull: Postsurgical changes related to a suboccipital craniectomy with encephalomalacia in the inferior cerebellum. Sinuses/Orbits: Right maxillary sinus mucous retention cyst. Otherwise negative. Other: The patient is intubated within oral enteric tube. IMPRESSION: 1. Massive hemorrhage centered in right basal ganglia extending into upper brainstem, approximately 210 cc. 2. Intraventricular hemorrhage greatest in right lateral ventricle. 3. Severe enlargement of left lateral ventricle, likely due to obstruction. 4. 25 mm right-to-left midline shift at level of septum pellucidum. Complete basilar cistern and sulcal effacement indicating increased intracranial pressure. Critical Value/emergent results were called by telephone at the time of interpretation on 09/02/17 at 11:42 pm to  Dr. Orson SlickANDREW COLSON , who verbally acknowledged these results. Electronically Signed   By: Mitzi HansenLance  Furusawa-Stratton M.D.   On: 010/24/18 23:44   Dg Chest Portable 1 View  Result Date: 09/02/17 CLINICAL DATA:  Tube placement, GI bleed EXAM: PORTABLE CHEST 1 VIEW COMPARISON:  08/29/2016 FINDINGS: Endotracheal tube tip about 2.6 cm superior to the carina. No focal infiltrate or effusion. Normal heart size. Tortuous ectatic aorta with calcification. No pneumothorax. IMPRESSION: Endotracheal tube tip about 2.6 cm superior to the carina. No infiltrate or edema Electronically Signed   By: Jasmine PangKim  Fujinaga M.D.   On: 010/24/18 23:34      Scheduled Meds: Continuous Infusions:   LOS: 0 days    Time spent in minutes: 35    Calvert CantorSaima Riot Waterworth, MD [facility] Pager: [email or web address] Password [email or web address] 05/20/2017, 12:57 PM

## 2017-06-10 NOTE — Progress Notes (Signed)
Family does not wish for vital signs to be done. Does not want SCDs. Dr. Octavio GravesKrakandy made aware.

## 2017-06-10 NOTE — ED Notes (Signed)
EDP explained admission plan/comfort care to pt.'s family at bedside .

## 2017-06-10 NOTE — ED Notes (Signed)
Family members assisted to conference room .

## 2017-06-10 DEATH — deceased
